# Patient Record
Sex: Male | Born: 1945
Health system: Southern US, Community
[De-identification: ages and names within clinical notes are randomized; demographics above are authoritative.]

## PROBLEM LIST (undated history)

## (undated) ENCOUNTER — Emergency Department (HOSPITAL_BASED_OUTPATIENT_CLINIC_OR_DEPARTMENT_OTHER): Admission: EM | Payer: Medicare HMO | Source: Home / Self Care

## (undated) DIAGNOSIS — C679 Malignant neoplasm of bladder, unspecified: Secondary | ICD-10-CM

## (undated) DIAGNOSIS — N4341 Spermatocele of epididymis, single: Secondary | ICD-10-CM

## (undated) DIAGNOSIS — R972 Elevated prostate specific antigen [PSA]: Secondary | ICD-10-CM

## (undated) DIAGNOSIS — E785 Hyperlipidemia, unspecified: Secondary | ICD-10-CM

## (undated) DIAGNOSIS — I2510 Atherosclerotic heart disease of native coronary artery without angina pectoris: Secondary | ICD-10-CM

## (undated) DIAGNOSIS — I861 Scrotal varices: Secondary | ICD-10-CM

## (undated) DIAGNOSIS — D649 Anemia, unspecified: Secondary | ICD-10-CM

## (undated) DIAGNOSIS — K4090 Unilateral inguinal hernia, without obstruction or gangrene, not specified as recurrent: Secondary | ICD-10-CM

## (undated) DIAGNOSIS — C44311 Basal cell carcinoma of skin of nose: Secondary | ICD-10-CM

## (undated) DIAGNOSIS — K5730 Diverticulosis of large intestine without perforation or abscess without bleeding: Secondary | ICD-10-CM

## (undated) DIAGNOSIS — G44009 Cluster headache syndrome, unspecified, not intractable: Secondary | ICD-10-CM

## (undated) DIAGNOSIS — N400 Enlarged prostate without lower urinary tract symptoms: Secondary | ICD-10-CM

## (undated) DIAGNOSIS — N529 Male erectile dysfunction, unspecified: Secondary | ICD-10-CM

## (undated) DIAGNOSIS — G43809 Other migraine, not intractable, without status migrainosus: Secondary | ICD-10-CM

## (undated) DIAGNOSIS — G4700 Insomnia, unspecified: Secondary | ICD-10-CM

## (undated) HISTORY — DX: Unilateral inguinal hernia, without obstruction or gangrene, not specified as recurrent: K40.90

## (undated) HISTORY — DX: Benign prostatic hyperplasia without lower urinary tract symptoms: N40.0

## (undated) HISTORY — DX: Hyperlipidemia, unspecified: E78.5

## (undated) HISTORY — PX: COLONOSCOPY W/ POLYPECTOMY: SHX1380

## (undated) HISTORY — DX: Elevated prostate specific antigen (PSA): R97.20

## (undated) HISTORY — PX: PROSTATE SURGERY: SHX751

## (undated) HISTORY — DX: Scrotal varices: I86.1

## (undated) HISTORY — PX: COLONOSCOPY: SHX174

## (undated) HISTORY — DX: Male erectile dysfunction, unspecified: N52.9

## (undated) HISTORY — PX: CARDIAC CATHETERIZATION: SHX172

## (undated) HISTORY — DX: Basal cell carcinoma of skin of nose: C44.311

## (undated) HISTORY — DX: Spermatocele of epididymis, single: N43.41

## (undated) HISTORY — DX: Malignant neoplasm of bladder, unspecified: C67.9

## (undated) HISTORY — DX: Insomnia, unspecified: G47.00

## (undated) HISTORY — DX: Other migraine, not intractable, without status migrainosus: G43.809

## (undated) HISTORY — PX: OTHER SURGICAL HISTORY: SHX169

## (undated) HISTORY — DX: Diverticulosis of large intestine without perforation or abscess without bleeding: K57.30

---

## 2004-07-19 ENCOUNTER — Ambulatory Visit: Payer: Self-pay | Admitting: Internal Medicine

## 2004-07-20 ENCOUNTER — Encounter: Admission: RE | Admit: 2004-07-20 | Discharge: 2004-07-20 | Payer: Self-pay | Admitting: Internal Medicine

## 2004-07-28 ENCOUNTER — Ambulatory Visit: Payer: Self-pay | Admitting: Cardiology

## 2004-09-06 ENCOUNTER — Encounter: Payer: Self-pay | Admitting: Internal Medicine

## 2004-10-08 ENCOUNTER — Ambulatory Visit: Payer: Self-pay | Admitting: Internal Medicine

## 2004-11-11 ENCOUNTER — Ambulatory Visit: Payer: Self-pay | Admitting: Internal Medicine

## 2005-01-27 ENCOUNTER — Ambulatory Visit: Payer: Self-pay | Admitting: Internal Medicine

## 2005-01-31 ENCOUNTER — Ambulatory Visit: Payer: Self-pay | Admitting: Internal Medicine

## 2006-01-12 ENCOUNTER — Ambulatory Visit: Payer: Self-pay | Admitting: Internal Medicine

## 2006-01-31 ENCOUNTER — Ambulatory Visit: Payer: Self-pay | Admitting: Gastroenterology

## 2006-02-10 ENCOUNTER — Ambulatory Visit (HOSPITAL_COMMUNITY): Admission: RE | Admit: 2006-02-10 | Discharge: 2006-02-10 | Payer: Self-pay | Admitting: Gastroenterology

## 2006-02-20 ENCOUNTER — Encounter: Payer: Self-pay | Admitting: Internal Medicine

## 2006-02-20 LAB — HM COLONOSCOPY: HM Colonoscopy: NORMAL

## 2006-03-03 ENCOUNTER — Ambulatory Visit: Payer: Self-pay | Admitting: Gastroenterology

## 2006-04-21 ENCOUNTER — Ambulatory Visit: Payer: Self-pay | Admitting: Internal Medicine

## 2006-06-08 ENCOUNTER — Ambulatory Visit: Payer: Self-pay | Admitting: Internal Medicine

## 2006-06-13 ENCOUNTER — Encounter: Admission: RE | Admit: 2006-06-13 | Discharge: 2006-06-13 | Payer: Self-pay | Admitting: Internal Medicine

## 2006-09-18 ENCOUNTER — Encounter: Payer: Self-pay | Admitting: Internal Medicine

## 2006-12-26 ENCOUNTER — Encounter: Payer: Self-pay | Admitting: Internal Medicine

## 2007-01-15 ENCOUNTER — Ambulatory Visit: Payer: Self-pay | Admitting: Internal Medicine

## 2007-01-15 DIAGNOSIS — G43809 Other migraine, not intractable, without status migrainosus: Secondary | ICD-10-CM

## 2007-01-15 DIAGNOSIS — G4700 Insomnia, unspecified: Secondary | ICD-10-CM | POA: Insufficient documentation

## 2007-01-15 DIAGNOSIS — G8929 Other chronic pain: Secondary | ICD-10-CM | POA: Insufficient documentation

## 2007-01-15 DIAGNOSIS — Z8719 Personal history of other diseases of the digestive system: Secondary | ICD-10-CM | POA: Insufficient documentation

## 2007-01-15 DIAGNOSIS — R51 Headache: Secondary | ICD-10-CM

## 2007-01-15 HISTORY — DX: Other migraine, not intractable, without status migrainosus: G43.809

## 2007-01-15 LAB — CONVERTED CEMR LAB
BUN: 18 mg/dL (ref 6–23)
Basophils Absolute: 0 K/uL (ref 0.0–0.1)
Basophils Relative: 0.7 % (ref 0.0–1.0)
CO2: 27 meq/L (ref 19–32)
Calcium: 9.1 mg/dL (ref 8.4–10.5)
Chloride: 109 meq/L (ref 96–112)
Cholesterol: 182 mg/dL (ref 0–200)
Creatinine, Ser: 1 mg/dL (ref 0.4–1.5)
Eosinophils Absolute: 0.2 K/uL (ref 0.0–0.6)
Eosinophils Relative: 5.1 % — ABNORMAL HIGH (ref 0.0–5.0)
GFR calc Af Amer: 98 mL/min
GFR calc non Af Amer: 81 mL/min
Glucose, Bld: 92 mg/dL (ref 70–99)
HCT: 42.8 % (ref 39.0–52.0)
HDL: 50.7 mg/dL (ref >39.0)
Hemoglobin: 15.2 g/dL (ref 13.0–17.0)
LDL Cholesterol: 111 mg/dL — ABNORMAL HIGH (ref 0–99)
Lymphocytes Relative: 26.3 % (ref 12.0–46.0)
MCHC: 35.4 g/dL (ref 30.0–36.0)
MCV: 96.1 fL (ref 78.0–100.0)
Monocytes Absolute: 0.4 K/uL (ref 0.2–0.7)
Monocytes Relative: 8.7 % (ref 3.0–11.0)
Neutro Abs: 2.6 K/uL (ref 1.4–7.7)
Neutrophils Relative %: 59.2 % (ref 43.0–77.0)
PSA: 3.34 ng/mL (ref 0.10–4.00)
Platelets: 258 K/uL (ref 150–400)
Potassium: 4.1 meq/L (ref 3.5–5.1)
RBC: 4.45 M/uL (ref 4.22–5.81)
RDW: 12.3 % (ref 11.5–14.6)
Sodium: 144 meq/L (ref 135–145)
TSH: 2.62 u[IU]/mL (ref 0.35–5.50)
Total CHOL/HDL Ratio: 3.6
Triglycerides: 102 mg/dL (ref 0–149)
VLDL: 20 mg/dL (ref 0–40)
WBC: 4.4 10*3/microliter — ABNORMAL LOW (ref 4.5–10.5)

## 2007-01-25 ENCOUNTER — Encounter (INDEPENDENT_AMBULATORY_CARE_PROVIDER_SITE_OTHER): Payer: Self-pay | Admitting: *Deleted

## 2007-01-25 LAB — CONVERTED CEMR LAB
BUN: 18 mg/dL (ref 6–23)
Basophils Relative: 0.7 % (ref 0.0–1.0)
CO2: 27 meq/L (ref 19–32)
Calcium: 9.1 mg/dL (ref 8.4–10.5)
Chloride: 109 meq/L (ref 96–112)
Creatinine, Ser: 1 mg/dL (ref 0.4–1.5)
GFR calc Af Amer: 98 mL/min
HDL: 50.7 mg/dL (ref >39.0)
LDL Cholesterol: 111 mg/dL — ABNORMAL HIGH (ref 0–99)
Monocytes Relative: 8.7 % (ref 3.0–11.0)
Platelets: 258 10*3/uL (ref 150–400)
RBC: 4.45 M/uL (ref 4.22–5.81)
RDW: 12.3 % (ref 11.5–14.6)
TSH: 2.62 microintl units/mL (ref 0.35–5.50)
Triglycerides: 102 mg/dL (ref 0–149)
VLDL: 20 mg/dL (ref 0–40)

## 2007-04-24 ENCOUNTER — Ambulatory Visit: Payer: Self-pay | Admitting: Internal Medicine

## 2007-08-20 ENCOUNTER — Telehealth (INDEPENDENT_AMBULATORY_CARE_PROVIDER_SITE_OTHER): Payer: Self-pay | Admitting: *Deleted

## 2008-02-04 ENCOUNTER — Ambulatory Visit: Payer: Self-pay | Admitting: Internal Medicine

## 2008-02-04 DIAGNOSIS — K5730 Diverticulosis of large intestine without perforation or abscess without bleeding: Secondary | ICD-10-CM

## 2008-02-04 HISTORY — DX: Diverticulosis of large intestine without perforation or abscess without bleeding: K57.30

## 2008-02-07 ENCOUNTER — Encounter (INDEPENDENT_AMBULATORY_CARE_PROVIDER_SITE_OTHER): Payer: Self-pay | Admitting: *Deleted

## 2008-02-07 LAB — CONVERTED CEMR LAB
AST: 22 units/L (ref 0–37)
Basophils Absolute: 0 10*3/uL (ref 0.0–0.1)
Basophils Relative: 0.4 % (ref 0.0–3.0)
Calcium: 9.2 mg/dL (ref 8.4–10.5)
Chloride: 107 meq/L (ref 96–112)
Cholesterol: 176 mg/dL (ref 0–200)
Creatinine, Ser: 1 mg/dL (ref 0.4–1.5)
Eosinophils Absolute: 0.3 10*3/uL (ref 0.0–0.7)
GFR calc Af Amer: 98 mL/min
GFR calc non Af Amer: 81 mL/min
MCHC: 34.7 g/dL (ref 30.0–36.0)
MCV: 99.3 fL (ref 78.0–100.0)
Neutro Abs: 2.4 10*3/uL (ref 1.4–7.7)
Neutrophils Relative %: 53.7 % (ref 43.0–77.0)
RBC: 4.33 M/uL (ref 4.22–5.81)
VLDL: 14 mg/dL (ref 0–40)

## 2008-02-18 ENCOUNTER — Telehealth (INDEPENDENT_AMBULATORY_CARE_PROVIDER_SITE_OTHER): Payer: Self-pay | Admitting: *Deleted

## 2008-08-20 ENCOUNTER — Telehealth: Payer: Self-pay | Admitting: Internal Medicine

## 2008-10-13 ENCOUNTER — Telehealth: Payer: Self-pay | Admitting: Internal Medicine

## 2008-10-31 ENCOUNTER — Encounter: Payer: Self-pay | Admitting: Internal Medicine

## 2008-12-17 ENCOUNTER — Ambulatory Visit: Payer: Self-pay | Admitting: Internal Medicine

## 2008-12-17 DIAGNOSIS — T887XXA Unspecified adverse effect of drug or medicament, initial encounter: Secondary | ICD-10-CM | POA: Insufficient documentation

## 2008-12-23 LAB — CONVERTED CEMR LAB
ALT: 20 units/L (ref 0–53)
Albumin: 4.1 g/dL (ref 3.5–5.2)
Total Protein: 6.9 g/dL (ref 6.0–8.3)

## 2009-02-20 ENCOUNTER — Telehealth: Payer: Self-pay | Admitting: Internal Medicine

## 2009-04-20 ENCOUNTER — Ambulatory Visit: Payer: Self-pay | Admitting: Internal Medicine

## 2009-04-28 LAB — CONVERTED CEMR LAB
ALT: 20 units/L (ref 0–53)
AST: 26 units/L (ref 0–37)
Basophils Relative: 0.7 % (ref 0.0–3.0)
Calcium: 9.1 mg/dL (ref 8.4–10.5)
Cholesterol: 173 mg/dL (ref 0–200)
Creatinine, Ser: 0.9 mg/dL (ref 0.4–1.5)
Eosinophils Absolute: 0.2 10*3/uL (ref 0.0–0.7)
Eosinophils Relative: 4.6 % (ref 0.0–5.0)
HDL: 58.3 mg/dL (ref >39.00)
LDL Cholesterol: 99 mg/dL (ref 0–99)
Lymphocytes Relative: 26.9 % (ref 12.0–46.0)
Neutrophils Relative %: 58.7 % (ref 43.0–77.0)
Platelets: 238 10*3/uL (ref 150.0–400.0)
RBC: 4.36 M/uL (ref 4.22–5.81)
Total CHOL/HDL Ratio: 3
Triglycerides: 77 mg/dL (ref 0.0–149.0)
WBC: 4.3 10*3/uL — ABNORMAL LOW (ref 4.5–10.5)

## 2009-07-25 ENCOUNTER — Emergency Department (HOSPITAL_COMMUNITY): Admission: EM | Admit: 2009-07-25 | Discharge: 2009-07-25 | Payer: Self-pay | Admitting: Family Medicine

## 2009-09-02 ENCOUNTER — Ambulatory Visit: Payer: Self-pay | Admitting: Internal Medicine

## 2009-09-07 ENCOUNTER — Telehealth (INDEPENDENT_AMBULATORY_CARE_PROVIDER_SITE_OTHER): Payer: Self-pay | Admitting: *Deleted

## 2009-10-16 ENCOUNTER — Telehealth: Payer: Self-pay | Admitting: Internal Medicine

## 2010-04-20 ENCOUNTER — Telehealth: Payer: Self-pay | Admitting: Internal Medicine

## 2010-04-25 ENCOUNTER — Encounter: Payer: Self-pay | Admitting: Internal Medicine

## 2010-04-26 ENCOUNTER — Ambulatory Visit
Admission: RE | Admit: 2010-04-26 | Discharge: 2010-04-26 | Payer: Self-pay | Source: Home / Self Care | Attending: Internal Medicine | Admitting: Internal Medicine

## 2010-04-26 ENCOUNTER — Other Ambulatory Visit: Payer: Self-pay | Admitting: Internal Medicine

## 2010-04-26 ENCOUNTER — Encounter: Payer: Self-pay | Admitting: Internal Medicine

## 2010-04-26 LAB — BASIC METABOLIC PANEL
BUN: 18 mg/dL (ref 6–23)
CO2: 25 mEq/L (ref 19–32)
GFR: 86.18 mL/min (ref >60.00)

## 2010-04-26 LAB — HEPATIC FUNCTION PANEL
Albumin: 4.2 g/dL (ref 3.5–5.2)
Bilirubin, Direct: 0.2 mg/dL (ref 0.0–0.3)
Total Bilirubin: 1.2 mg/dL (ref 0.3–1.2)
Total Protein: 6.5 g/dL (ref 6.0–8.3)

## 2010-04-26 LAB — CBC WITH DIFFERENTIAL/PLATELET
Basophils Absolute: 0 10*3/uL (ref 0.0–0.1)
Eosinophils Absolute: 0.2 10*3/uL (ref 0.0–0.7)
Eosinophils Relative: 4.8 % (ref 0.0–5.0)
HCT: 44.3 % (ref 39.0–52.0)
Lymphocytes Relative: 28.6 % (ref 12.0–46.0)
MCHC: 35 g/dL (ref 30.0–36.0)
Monocytes Absolute: 0.4 10*3/uL (ref 0.1–1.0)
Neutrophils Relative %: 57.2 % (ref 43.0–77.0)
RDW: 13.1 % (ref 11.5–14.6)
WBC: 4.1 10*3/uL — ABNORMAL LOW (ref 4.5–10.5)

## 2010-04-26 LAB — LIPID PANEL
Cholesterol: 197 mg/dL (ref 0–200)
HDL: 61.3 mg/dL (ref >39.00)
LDL Cholesterol: 115 mg/dL — ABNORMAL HIGH (ref 0–99)
Total CHOL/HDL Ratio: 3
Triglycerides: 102 mg/dL (ref 0.0–149.0)

## 2010-04-26 LAB — PSA: PSA: 3.22 ng/mL (ref 0.10–4.00)

## 2010-05-04 NOTE — Progress Notes (Signed)
Summary: labs results  Phone Note Outgoing Call   Call placed by: [facility] CMA,  September 07, 2009 10:29 AM Details for Reason: advise patient: PSA back to baseline Will recheck it at the time of his physical Summary of Call: left message to call  office............Marland KitchenFelecia Deloach CMA  September 07, 2009 10:29 AM DISCUSS WITH PATIENT .Marland KitchenMarland KitchenFelecia Deloach CMA  September 07, 2009 11:05 AM

## 2010-05-04 NOTE — Assessment & Plan Note (Signed)
Summary: CPX//PH   Vital Signs:  Patient profile:   65 year old male Height:      73 inches Weight:      203 pounds BMI:     26.88 Pulse rate:   70 / minute Pulse rhythm:   regular BP sitting:   131 / 82  (left arm) Cuff size:   large  Vitals Entered By: Shary Decamp (April 20, 2009 9:42 AM) CC: cpx   History of Present Illness: CPX feeling well    Preventive Screening-Counseling & Management  Alcohol-Tobacco     Alcohol drinks/day: 4-5/wk     Alcohol type: beer, wine     Smoking Status: quit < 6 months (17-18 years ago)  Caffeine-Diet-Exercise     Caffeine use/day: 4     Does Patient Exercise: yes     Times/week: 4  Current Medications (verified): 1)  Valtrex 1 Gm Tabs (Valacyclovir Hcl) .... Take 2  Tablet By Mouth Twice A Day For One Day For Each Episode of  Cold Sores 2)  Klonopin 0.5 Mg Tabs (Clonazepam) .... Take 1 Tablet By Mouth At Bedtime 3)  Aspir-Low 81 Mg  Tbec (Aspirin)  Allergies (verified): No Known Drug Allergies  Past History:  Past Medical History: Reviewed history from 02/04/2008 and no changes required. Diverticulosis, colon (Cscope aprox 2007) sees Dermatologist Routinely  (has sun damage)  Past Surgical History: Reviewed history from 02/04/2008 and no changes required. no  Family History: Reviewed history from 01/15/2007 and no changes required. 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 CV:  Denies chest pain or discomfort, palpitations, and swelling of feet. Resp:  Denies cough and shortness of breath. GI:  Denies bloody stools, diarrhea, nausea, and  vomiting. GU:  Denies dysuria, hematuria, urinary frequency, and urinary hesitancy. Psych:  Denies anxiety and depression; sleeps well generally , uses klonopin which helps .  Physical Exam  General:  alert, well-developed, and well-nourished.   Neck:  no thyromegaly.   Lungs:  normal respiratory effort, no intercostal retractions, no accessory muscle use, and normal breath sounds.   Heart:  normal rate, regular rhythm, no murmur, and no gallop.   Abdomen:  soft, non-tender, no distention, no hepatomegaly, and no splenomegaly.   Rectal:  No external abnormalities noted. Normal sphincter tone. No rectal masses or tenderness. Prostate:  Prostate gland firm and smooth, no enlargement, nodularity, tenderness, mass, asymmetry or induration. Extremities:  no pretibial edema bilaterally  Psych:  Cognition and judgment appear intact. Alert and cooperative with normal attention span and concentration. not anxious appearing and not depressed appearing.     Impression & Recommendations:  Problem # 1:  ROUTINE GENERAL MEDICAL EXAM@HEALTH  CARE FACL (ICD-V70.0) Td 2004 had a flu shot  had a shingles shot age 69  Cscope 2007, next 10 years  continue healthy life style  Orders: Venipuncture (16109) TLB-ALT (SGPT) (84460-ALT) TLB-AST (SGOT) (84450-SGOT) TLB-BMP (Basic Metabolic Panel-BMET) (80048-METABOL) TLB-CBC Platelet - w/Differential (85025-CBCD) TLB-Lipid Panel (80061-LIPID) TLB-PSA (Prostate Specific Antigen) (84153-PSA) TLB-TSH (Thyroid Stimulating Hormone) (84443-TSH)  Complete Medication List: 1)  Valtrex 1 Gm Tabs (Valacyclovir hcl) .... Take 2  tablet  by mouth twice a day for one day for each episode of  cold sores 2)  Klonopin 0.5 Mg Tabs (Clonazepam) .... Take 1 tablet by mouth at bedtime 3)  Aspir-low 81 Mg Tbec (Aspirin)  Patient Instructions: 1)  Please schedule a follow-up appointment in 1 year.  Prescriptions: KLONOPIN 0.5 MG TABS (CLONAZEPAM) Take 1 tablet by mouth at  bedtime  #90 x 1   Entered by:   Shary Decamp   Authorized by:   Nolon Rod. Marshelle Bilger MD   Signed by:   Shary Decamp on 04/20/2009   Method used:   Print then Give to Patient   RxID:   [id number] VALTREX 1 GM TABS (VALACYCLOVIR HCL) Take 2  tablet by mouth twice a day for one day for each episode of  cold sores  #30 x 1   Entered by:   Shary Decamp   Authorized by:   Nolon Rod. Patsy Varma MD   Signed by:   Shary Decamp on 04/20/2009   Method used:   Print then Give to Patient   RxID:   [id number]    Preventive Care Screening  Colonoscopy:    Date:  02/20/2006    Next Due:  03/2016    Results:  Diverticulosis   Prior Values:    PSA:  1.96 (02/04/2008)    Last Tetanus Booster:  Historical (04/04/2002)      Immunization History:  Influenza Immunization History:    Influenza:  historical (01/02/2009)

## 2010-05-04 NOTE — Progress Notes (Signed)
Summary: refill  Phone Note Refill Request Message from:  Fax from Pharmacy on October 16, 2009 9:57 AM  Refills Requested: Medication #1:  KLONOPIN 0.5 MG TABS Take 1 tablet by mouth at bedtime Ivar Bury - fax [phone] --- tel [phone]  Initial call taken by: Okey Regal [place],  October 16, 2009 9:57 AM  Follow-up for Phone Call        ok 90 and 1 RF Daron Stutz E. Jatziry Wechter MD  October 16, 2009 3:50 PM     Prescriptions: KLONOPIN 0.5 MG TABS (CLONAZEPAM) Take 1 tablet by mouth at bedtime  #90 x 1   Entered by:   Army Fossa CMA   Authorized by:   Nolon Rod. Makinna Andy MD   Signed by:   Army Fossa CMA on 10/16/2009   Method used:   Printed then faxed to ...       [phone]ail[facility]       [address]       [place], [place]  [address]       Ph: [phone]       Fax: [phone]   RxID:   [id number]

## 2010-05-04 NOTE — Letter (Signed)
Summary: Cancer Screening/[facility] Personalized Risk Profile  Cancer Screening/[facility] Personalized Risk Profile   Imported By: Lanelle Bal 04/29/2009 11:03:30  _____________________________________________________________________  External Attachment:    Type:   Image     Comment:   External Document

## 2010-05-06 NOTE — Progress Notes (Signed)
Summary: Rx Refill Request  Phone Note Refill Request Call back at [phone] Message from:  Fax from Pharmacy on April 20, 2010 8:49 AM  Refills Requested: Medication #1:  KLONOPIN 0.5 MG TABS Take 1 tablet by mouth at bedtime   Supply Requested: 3 months   Last Refilled: [facility]/22/[facility] [facility]# [phone]   Method Requested: Fax to Local Pharmacy Next Appointment Scheduled: Jan. 23,2012 Initial call taken by: Barnie Mort,  April 20, 2010 8:49 AM  Follow-up for Phone Call        90 and 1 RF Follow-up by: [facility] E. Paz MD,  April 20, 2010 3:17 PM    Prescriptions: KLONOPIN 0.5 MG TABS (CLONAZEPAM) Take 1 tablet by mouth at bedtime  #90 x 1   Entered by:   Army Fossa CMA   Authorized by:   Nolon Rod. Paz MD   Signed by:   Army Fossa CMA on 04/20/2010   Method used:   Printed then faxed to ...       [facility]  [phone][facility] (retail[facility]       [address]       [place], [place]  [address]       Ph: [phone] or [phone]       Fax: [phone]   RxID:   [id number]

## 2010-05-06 NOTE — Assessment & Plan Note (Signed)
Summary: cpx / lab/cbs   Vital Signs:  Patient profile:   65 year old male Height:      73 inches (185.42 cm) Weight:      198.50 pounds (90.23 kg) BMI:     26.28 Temp:     98.4 degrees F (36.89 degrees C) oral BP sitting:   110 / 70  (left arm)  Vitals Entered By: Lucious Groves CMA (April 26, 2010 8:32 AM) CC: Fasting CPX./kb Is Patient Diabetic? No Pain Assessment Patient in pain? no      Comments Needs refill of Valtrex and Klonopin.   History of Present Illness: CPX feels well   Current Medications (verified): 1)  Valtrex 1 Gm Tabs (Valacyclovir Hcl) .... Take 2  Tablet By Mouth Twice A Day For One Day For Each Episode of  Cold Sores 2)  Klonopin 0.5 Mg Tabs (Clonazepam) .... Take 1 Tablet By Mouth At Bedtime 3)  Aspir-Low 81 Mg  Tbec (Aspirin)  Allergies (verified): No Known Drug Allergies  Past History:  Past Medical History: Reviewed history from 02/04/2008 and no changes required. Diverticulosis, colon (Cscope aprox 2007) sees Dermatologist Routinely  (has sun damage)  Past Surgical History: Reviewed history from 02/04/2008 and no changes required. no  Family History: Reviewed history from 01/15/2007 and no changes required. DM: M and Uncle colon ca--no Prostate ca--no MI--no  Social History: Reviewed history from 04/20/2009 and no changes required. 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 CV:  Denies chest pain or discomfort and swelling of feet. Resp:  Denies cough and shortness of breath. GI:  Denies bloody stools, nausea, and vomiting. GU:  Denies hematuria, urinary frequency, and urinary hesitancy.  Physical Exam  General:  alert, well-developed, and well-nourished.   Neck:  no thyromegaly.  normal carotid upstroke.   Lungs:   normal respiratory effort, no intercostal retractions, no accessory muscle use, and normal breath sounds.   Heart:  normal rate, regular rhythm, no murmur, and no gallop.   Abdomen:  soft, non-tender, no distention, no hepatomegaly, and no splenomegaly.   Rectal:  No external abnormalities noted. Normal sphincter tone. No rectal masses or tenderness. Prostate:  Prostate gland firm and smooth, no enlargement, nodularity, tenderness, mass, asymmetry or induration. Extremities:  no pretibial edema bilaterally  Psych:  Oriented X3, memory intact for recent and remote, normally interactive, good eye contact, not anxious appearing, and not depressed appearing.     Impression & Recommendations:  Problem # 1:  ROUTINE GENERAL MEDICAL EXAM@HEALTH  CARE FACL (ICD-V70.0) Td 2004 had a flu shot  had a shingles shot age 7  Cscope 2007, next 10 years  continue healthy life style EKG--normal sinus rhythm, similar to previous EKGs labs.  we discussed the utility of a PSA, for now we'll continue checking yearly.    Orders: Venipuncture (04540) TLB-Lipid Panel (80061-LIPID) TLB-BMP (Basic Metabolic Panel-BMET) (80048-METABOL) TLB-CBC Platelet - w/Differential (85025-CBCD) TLB-PSA (Prostate Specific Antigen) (84153-PSA) TLB-Hepatic/Liver Function Pnl (80076-HEPATIC) Specimen Handling (98119)  Complete Medication List: 1)  Valtrex 1 Gm Tabs (Valacyclovir hcl) .... Take 2  tablet by mouth twice a day for one day for each episode of  cold sores 2)  Klonopin 0.5 Mg Tabs (Clonazepam) .... Take 1 tablet by mouth at bedtime 3)  Aspir-low 81 Mg Tbec (Aspirin)  Patient  Instructions: 1)  Please schedule a follow-up appointment in 1 year.  Prescriptions: VALTREX 1 GM TABS (VALACYCLOVIR HCL) Take 2  tablet by mouth twice a day for one day for each episode of  cold sores  #30 x 3   Entered and Authorized by:   Nolon Rod. Silvio Sausedo MD   Signed by:   Nolon Rod. Earla Charlie MD on 04/26/2010   Method used:   Print then Give to  Patient   RxID:   [id number] KLONOPIN 0.5 MG TABS (CLONAZEPAM) Take 1 tablet by mouth at bedtime  #90 x 1   Entered and Authorized by:   Elita Quick E. Rudene Poulsen MD   Signed by:   Nolon Rod. Timofey Carandang MD on 04/26/2010   Method used:   Print then Give to Patient   RxID:   [id number]    Orders Added: 1)  Venipuncture [84696] 2)  TLB-Lipid Panel [80061-LIPID] 3)  TLB-BMP (Basic Metabolic Panel-BMET) [80048-METABOL] 4)  TLB-CBC Platelet - w/Differential [85025-CBCD] 5)  TLB-PSA (Prostate Specific Antigen) [84153-PSA] 6)  TLB-Hepatic/Liver Function Pnl [80076-HEPATIC] 7)  Specimen Handling [99000] 8)  Est. Patient age 69-64 [phone]

## 2010-08-20 NOTE — Assessment & Plan Note (Signed)
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  He is  referred through the courtesy of Dr. Drue Novel for evaluation of rectal  discomfort.   Eric Vang has had some hemorrhoids problems for years.  He recently saw  Dr. Drue Novel because of some rectal discomfort and had a negative anoscopic exam,  except for some small internal hemorrhoids noted on January 12, 2006.  The  patient has had 3 flexible sigmoidoscopies in the past when he lived in  [place], last performed 4 years ago.  He currently is using some local  salve on his hemorrhoids and is having no problems or bleeding.  He does  have regular bowel movements without melena or hematochezia.  He denies  upper GI or hepatobiliary complaints.  His appetite is good and his weight  is stable, and he does follow a high-fiber diet.   PAST MEDICAL HISTORY:  Otherwise, entirely noncontributory.  He is on no  medications except for 81 mg of aspirin a day and he had recently been  started on Klonopin 0.5 mg at bedtime.  He denies allergies.   FAMILY HISTORY:  Noncontributory.   SOCIAL HISTORY:  He is married and lives with his wife and mother.  He has a  Naval architect.  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  He does  not smoke and uses ethanol socially.   REVIEW OF SYSTEMS:  Entirely noncontributory.   EXAMINATION:  He is 6 feet 1 inch tall and weighs 209 pounds.  Blood  pressure 120/84, and pulse was 60 and regular.  Could not appreciate stigmata of chronic liver disease or lymphadenopathy.  CHEST:  Clear to percussion and auscultation.  There were no murmurs, gallops, or rubs noted.  He appeared to be in a  regular rhythm.  I could not hepatosplenomegaly, abdominal  masses, or tenderness.  Peripheral extremities were unremarkable.  Mental status was normal.  RECTAL:  Deferred.   ASSESSMENT:  Eric Vang is due for screening colonoscopy because of his  age.  As mentioned above, he has had sigmoidoscopies, but never has had a  full colonoscopy exam.  It does sound like he has some mild internal  hemorrhoid problems, which are managed by increasing fiber in his diet and  local anal care.   RECOMMENDATIONS:  We have gone ahead and scheduled a colonoscopy at his  convenience, off salicylate therapy.  He is to continue his followup with  Dr. Drue Novel as previously planned.     Eric Rea. Jarold Motto, MD, Caleen Essex, FAGA    DRP/MedQ  DD: 01/31/2006  DT: 01/31/2006  Job #: [id number]   cc:   Willow Ora, MD

## 2010-10-14 ENCOUNTER — Other Ambulatory Visit: Payer: Self-pay | Admitting: Internal Medicine

## 2010-10-14 MED ORDER — CLONAZEPAM 0.5 MG PO TABS: 0.5000 mg | ORAL_TABLET | Freq: Every evening | ORAL | Status: DC | PRN

## 2010-10-14 NOTE — Telephone Encounter (Signed)
Ok 90 and 1 RF 

## 2010-10-14 NOTE — Telephone Encounter (Signed)
Last refilled 04/26/10 #90 x 1.

## 2011-04-12 ENCOUNTER — Other Ambulatory Visit: Payer: Self-pay | Admitting: Internal Medicine

## 2011-04-12 MED ORDER — CLONAZEPAM 0.5 MG PO TABS: 0.5000 mg | ORAL_TABLET | Freq: Every evening | ORAL | Status: DC | PRN

## 2011-04-12 NOTE — Telephone Encounter (Signed)
Ok #90, no refills 

## 2011-04-12 NOTE — Telephone Encounter (Signed)
Last filled 10-14-10 #90 1, last OV 04-26-10

## 2011-04-12 NOTE — Telephone Encounter (Signed)
Rx sent 

## 2013-05-25 LAB — CBC AND DIFFERENTIAL
HCT: 47 % (ref 41–53)
Hemoglobin: 15.8 g/dL (ref 13.5–17.5)
Neutrophils Absolute: 2 /uL
Platelets: 270 10*3/uL (ref 150–399)
WBC: 4.2 10^3/mL

## 2013-05-25 LAB — BASIC METABOLIC PANEL
BUN: 19 mg/dL (ref 4–21)
Creatinine: 0.8 mg/dL (ref 0.6–1.3)
GLUCOSE: 86 mg/dL
POTASSIUM: 4.3 mmol/L (ref 3.4–5.3)
Sodium: 139 mmol/L (ref 137–147)

## 2013-05-25 LAB — HEPATIC FUNCTION PANEL
ALT: 17 U/L (ref 10–40)
AST: 18 U/L (ref 14–40)
Alkaline Phosphatase: 63 U/L (ref 25–125)
Bilirubin, Total: 0.8 mg/dL

## 2013-05-25 LAB — FECAL OCCULT BLOOD, GUAIAC: Fecal Occult Blood: NEGATIVE

## 2013-05-25 LAB — TSH: TSH: 3.03 u[IU]/mL (ref 0.41–5.90)

## 2013-05-25 LAB — PSA: PSA: 3.12

## 2014-08-08 LAB — HEPATIC FUNCTION PANEL
ALK PHOS: 64 U/L (ref 25–125)
ALT: 22 U/L (ref 10–40)
AST: 20 U/L (ref 14–40)
Bilirubin, Total: 0.6 mg/dL

## 2014-08-08 LAB — LIPID PANEL
Cholesterol: 143 mg/dL (ref 0–200)
HDL: 66 mg/dL (ref 35–70)
LDL CALC: 62 mg/dL
LDL/HDL RATIO: 2
Triglycerides: 76 mg/dL (ref 40–160)

## 2014-08-08 LAB — BASIC METABOLIC PANEL
BUN: 21 mg/dL (ref 4–21)
Creatinine: 0.8 mg/dL (ref 0.6–1.3)
Glucose: 84 mg/dL
Potassium: 4.7 mmol/L (ref 3.4–5.3)
SODIUM: 144 mmol/L (ref 137–147)

## 2014-08-08 LAB — PSA: PSA: 2.96

## 2014-10-07 ENCOUNTER — Telehealth: Payer: Self-pay | Admitting: Internal Medicine

## 2014-10-07 NOTE — Telephone Encounter (Signed)
Yes, please schedule an appointment at his convenience, if he needs to be seen soon, please make it happen

## 2014-10-07 NOTE — Telephone Encounter (Signed)
FYI. Please advise.

## 2014-10-07 NOTE — Telephone Encounter (Signed)
Relation to pt: self Call back number: [phone]   Reason for call:  Pt would like to reestablish care with Dr. Larose Kells please advise

## 2014-10-07 NOTE — Telephone Encounter (Signed)
Okay to schedule new Pt appt.

## 2014-10-07 NOTE — Telephone Encounter (Signed)
LVM advising pt to schedule appointment. Should it be 30 minute or 15 minute?

## 2014-11-11 ENCOUNTER — Telehealth: Payer: Self-pay | Admitting: *Deleted

## 2014-11-11 ENCOUNTER — Encounter: Payer: Self-pay | Admitting: *Deleted

## 2014-11-11 NOTE — Telephone Encounter (Signed)
Pre-Visit Call completed with patient and chart updated.   Pre-Visit Info documented in Specialty Comments under Snap[employer]ot.    

## 2014-11-12 ENCOUNTER — Ambulatory Visit (INDEPENDENT_AMBULATORY_CARE_PROVIDER_SITE_OTHER): Payer: Medicare HMO | Admitting: Internal Medicine

## 2014-11-12 ENCOUNTER — Encounter: Payer: Self-pay | Admitting: Internal Medicine

## 2014-11-12 VITALS — BP 118/66 | HR 68 | Temp 97.5°F | Ht 73.0 in | Wt 197.1 lb

## 2014-11-12 DIAGNOSIS — R351 Nocturia: Secondary | ICD-10-CM | POA: Diagnosis not present

## 2014-11-12 DIAGNOSIS — Z23 Encounter for immunization: Secondary | ICD-10-CM | POA: Diagnosis not present

## 2014-11-12 DIAGNOSIS — E785 Hyperlipidemia, unspecified: Secondary | ICD-10-CM

## 2014-11-12 DIAGNOSIS — Z8619 Personal history of other infectious and parasitic diseases: Secondary | ICD-10-CM | POA: Diagnosis not present

## 2014-11-12 NOTE — Patient Instructions (Addendum)
Please call if you need a refill  Next visit by May 2017 for a physical exam

## 2014-11-12 NOTE — Progress Notes (Signed)
Pre visit review using our clinic review tool, if applicable. No additional management support is needed unless otherwise documented below in the visit note. 

## 2014-11-12 NOTE — Progress Notes (Signed)
Subjective:    Patient ID: Eric Vang, male    DOB: 31-May-1945, 69 y.o.   MRN: [id number]  DOS:  11/12/2014 Type of visit - description : New patient, to get reestablished.  Interval history: Mild hyperlipidemia, labs reviewed, started simvastatin in 2015, good compliance, tolerance. Insomnia: Not an issues at this point History of cluster headaches: No further symptoms Also patient suspects he may have BPH, has occasional nocturia. No other symptoms.  Labs reviewed: 05-2013: Creatinine 0.8, CBC, LFT, TSH normal. PSA 3.1 iFOB negative  Labs 08-2014: Cholesterol 143, LDL 62.  CMP normal PSA 2.9   Review of Systems  No chest pain, difficulty breathing. No nausea, vomiting, diarrhea He remains active and eats healthy.   Past Medical History  Diagnosis Date  . Hyperlipidemia     started statin 2015   . Insomnia   . CLUSTER HEADACHE 01/15/2007    x 1 time  . DIVERTICULOSIS, COLON 02/04/2008    Past Surgical History  Procedure Laterality Date  . No past surgeries      Social History   Social History  . Marital Status: Married    Spouse Name: N/A  . Number of Children: 1  . Years of Education: N/A   Occupational History  . retired Scientist, water quality, works part time now    Social History Main Topics  . Smoking status: Never Smoker   . Smokeless tobacco: Not on file  . Alcohol Use: 0.0 oz/week    0 Standard drinks or equivalent per week     Comment: Social  . Drug Use: No  . Sexual Activity: Not on file   Other Topics Concern  . Not on file   Social History Narrative   Moved from [place] daughter is a Therapist, sports   Sister in law [name]      Family History  Problem Relation Age of Onset  . Diabetes Mother 41  . Atrial fibrillation Mother   . Cancer Father 20    bone   . Colon cancer Neg Hx   . Prostate cancer Neg Hx   . CAD Neg Hx        Medication List       This list is accurate as of: 11/12/14 11:59 PM.  Always use your most  recent med list.               aspirin 81 MG tablet  Take 81 mg by mouth daily.     simvastatin 10 MG tablet  Commonly known as:  ZOCOR  Take 10 mg by mouth daily.     valACYclovir 1000 MG tablet  Commonly known as:  VALTREX  Take 1,000 mg by mouth 2 (two) times daily as needed.           Objective:   Physical Exam BP 118/66 mmHg  Pulse 68  Temp(Src) 97.5 F (36.4 C) (Oral)  Ht 6\' 1"  (1.854 m)  Wt 197 lb 2 oz (89.415 kg)  BMI 26.01 kg/m2  SpO2 99% General:   Well developed, well nourished . NAD.  HEENT:  Normocephalic . Face symmetric, atraumatic Neck: No thyromegaly Lungs:  CTA B Normal respiratory effort, no intercostal retractions, no accessory muscle use. Heart: RRR,  no murmur.  no pretibial edema bilaterally  Abdomen:  Not distended, soft, non-tender. No rebound or rigidity.  Skin: Not pale. Not jaundice Neurologic:  alert & oriented X3.  Speech normal, gait appropriate for age and unassisted Psych--  Cognition  and judgment appear intact.  Cooperative with normal attention span and concentration.  Behavior appropriate. No anxious or depressed appearing.    Assessment & Plan:

## 2014-11-13 DIAGNOSIS — N400 Enlarged prostate without lower urinary tract symptoms: Secondary | ICD-10-CM | POA: Insufficient documentation

## 2014-11-13 NOTE — Assessment & Plan Note (Signed)
Mild symptoms at night, most recent PSA is normal. We agreed to drink plenty of fluids during the daytime, avoid irritants and reassess when he comes back. Consider medication.

## 2014-11-13 NOTE — Assessment & Plan Note (Signed)
Had a LDL of 213 and LDL of 126 back in February 2015, based on that he started simvastatin, repeated blood work 08/2014 show cholesterol 133 and a LDL of 62. Good compliance and tolerance with medications, continue present care. Reassess when he comes backing 08-2015 for a physical

## 2015-02-19 DIAGNOSIS — Z23 Encounter for immunization: Secondary | ICD-10-CM | POA: Diagnosis not present

## 2015-04-05 DIAGNOSIS — C44311 Basal cell carcinoma of skin of nose: Secondary | ICD-10-CM

## 2015-04-05 DIAGNOSIS — K4090 Unilateral inguinal hernia, without obstruction or gangrene, not specified as recurrent: Secondary | ICD-10-CM

## 2015-04-05 HISTORY — PX: MOHS SURGERY: SUR867

## 2015-04-05 HISTORY — DX: Basal cell carcinoma of skin of nose: C44.311

## 2015-04-05 HISTORY — DX: Unilateral inguinal hernia, without obstruction or gangrene, not specified as recurrent: K40.90

## 2015-05-01 DIAGNOSIS — L814 Other melanin hyperpigmentation: Secondary | ICD-10-CM | POA: Diagnosis not present

## 2015-05-01 DIAGNOSIS — D2371 Other benign neoplasm of skin of right lower limb, including hip: Secondary | ICD-10-CM | POA: Diagnosis not present

## 2015-05-01 DIAGNOSIS — D485 Neoplasm of uncertain behavior of skin: Secondary | ICD-10-CM | POA: Diagnosis not present

## 2015-05-01 DIAGNOSIS — L570 Actinic keratosis: Secondary | ICD-10-CM | POA: Diagnosis not present

## 2015-05-01 DIAGNOSIS — D1801 Hemangioma of skin and subcutaneous tissue: Secondary | ICD-10-CM | POA: Diagnosis not present

## 2015-05-01 DIAGNOSIS — L853 Xerosis cutis: Secondary | ICD-10-CM | POA: Diagnosis not present

## 2015-05-01 DIAGNOSIS — D225 Melanocytic nevi of trunk: Secondary | ICD-10-CM | POA: Diagnosis not present

## 2015-05-01 DIAGNOSIS — C44311 Basal cell carcinoma of skin of nose: Secondary | ICD-10-CM | POA: Diagnosis not present

## 2015-05-01 DIAGNOSIS — L821 Other seborrheic keratosis: Secondary | ICD-10-CM | POA: Diagnosis not present

## 2015-05-05 DIAGNOSIS — R69 Illness, unspecified: Secondary | ICD-10-CM | POA: Diagnosis not present

## 2015-05-19 DIAGNOSIS — C44311 Basal cell carcinoma of skin of nose: Secondary | ICD-10-CM | POA: Diagnosis not present

## 2015-05-19 DIAGNOSIS — Z85828 Personal history of other malignant neoplasm of skin: Secondary | ICD-10-CM | POA: Diagnosis not present

## 2015-05-25 DIAGNOSIS — L570 Actinic keratosis: Secondary | ICD-10-CM | POA: Diagnosis not present

## 2015-06-02 ENCOUNTER — Encounter: Payer: Self-pay | Admitting: Internal Medicine

## 2015-06-02 ENCOUNTER — Ambulatory Visit (INDEPENDENT_AMBULATORY_CARE_PROVIDER_SITE_OTHER): Payer: Medicare HMO | Admitting: Internal Medicine

## 2015-06-02 VITALS — BP 116/66 | HR 70 | Temp 97.9°F | Ht 73.0 in | Wt 204.1 lb

## 2015-06-02 DIAGNOSIS — Z09 Encounter for follow-up examination after completed treatment for conditions other than malignant neoplasm: Secondary | ICD-10-CM

## 2015-06-02 DIAGNOSIS — M25562 Pain in left knee: Secondary | ICD-10-CM | POA: Diagnosis not present

## 2015-06-02 NOTE — Progress Notes (Signed)
Pre visit review using our clinic review tool, if applicable. No additional management support is needed unless otherwise documented below in the visit note. 

## 2015-06-02 NOTE — Progress Notes (Signed)
Subjective:    Patient ID: Eric Vang, male    DOB: [birth year], 70 y.o.   MRN: [id number]  DOS:  06/02/2015 Type of visit - description : acute  Interval history:  Sx started few months ago w/ a ill defined numbness- discomfort L leg from the mid-thigh to the foot anteriorly   and posteriorly. Pain is not necessarily associated to walking or exercise but it decreases the  days he is inactive. Denies any injury , no redness, swelling. No knee pain per se. This morning he had a brief right great toe and second toe numbness  Review of Systems When asked, admits to occasional back pain, not severe or persistent. No fever chills No nausea vomiting No dysuria or gross hematuria  Past Medical History  Diagnosis Date  . Hyperlipidemia     started statin 2015   . Insomnia   . CLUSTER HEADACHE 01/15/2007    x 1 time  . DIVERTICULOSIS, COLON 02/04/2008  . Basal cell carcinoma of nose 2017    removed    Past Surgical History  Procedure Laterality Date  . No past surgeries      Social History   Social History  . Marital Status: Married    Spouse Name: N/A  . Number of Children: 1  . Years of Education: N/A   Occupational History  . retired Scientist, water quality, works part time now    Social History Main Topics  . Smoking status: Never Smoker   . Smokeless tobacco: Not on file  . Alcohol Use: 0.0 oz/week    0 Standard drinks or equivalent per week     Comment: Social  . Drug Use: No  . Sexual Activity: Not on file   Other Topics Concern  . Not on file   Social History Narrative   Moved from [place] daughter is a Therapist, sports   Sister in Sports coach [name]         Medication List       This list is accurate as of: 06/02/15 11:59 PM.  Always use your most recent med list.               aspirin 81 MG tablet  Take 81 mg by mouth daily.     simvastatin 10 MG tablet  Commonly known as:  ZOCOR  Take 10 mg by mouth daily.     valACYclovir 1000 MG tablet    Commonly known as:  VALTREX  Take 1,000 mg by mouth 2 (two) times daily as needed. Reported on 06/02/2015           Objective:   Physical Exam BP 116/66 mmHg  Pulse 70  Temp(Src) 97.9 F (36.6 C) (Oral)  Ht 6\' 1"  (1.854 m)  Wt 204 lb 2 oz (92.59 kg)  BMI 26.94 kg/m2  SpO2 97% General:   Well developed, well nourished . NAD.  HEENT:  Normocephalic . Face symmetric, atraumatic MSK: Knees normal to inspection - palpation Calve symmetric. Vascular: Normal femoral - pedal pulses Skin: Not pale. Not jaundice Neurologic:  alert & oriented X3.  Speech normal, gait appropriate for age and unassisted. DTRs symmetric, straight leg test negative Psych--  Cognition and judgment appear intact.  Cooperative with normal attention span and concentration.  Behavior appropriate. No anxious or depressed appearing.      Assessment & Plan:   Assessment Hyperlipidemia , rx statins 2015 Insomnia BCC, nose 2017 Cold sores, prn Valtrex H/o cluster HAs 1 -- 2008  Plan: Left leg pain: Normal vascular, neuro and muscular exam, etiology of pain unclear. He does have some mild back pain, radiculopathy?. We talked possibly conservative treatment versus referral for further eval, and the end we elected conservative treatment with Tylenol, Motrin, back stretching. He will call if he is not better or if symptoms increase.

## 2015-06-02 NOTE — Patient Instructions (Addendum)
Continue with normal activities and exercise   If pain take tylenol or ibuprofen, see below:  Tylenol  500 mg OTC 2 tabs a day every 8 hours as needed for pain  IBUPROFEN (Advil or Motrin) 200 mg 2 tablets every 6 hours as needed for pain.  Always take it with food because may cause gastritis and ulcers.  If you notice nausea, stomach pain, change in the color of stools --->  Stop the medicine and let us know     [place] with information about home physical therapy for back pain: [email or web address]

## 2015-06-03 DIAGNOSIS — Z09 Encounter for follow-up examination after completed treatment for conditions other than malignant neoplasm: Secondary | ICD-10-CM | POA: Insufficient documentation

## 2015-06-03 NOTE — Assessment & Plan Note (Signed)
Left leg pain: Normal vascular, neuro and muscular exam, etiology of pain unclear. He does have some mild back pain, radiculopathy?. We talked possibly conservative treatment versus referral for further eval, and the end we elected conservative treatment with Tylenol, Motrin, back stretching. He will call if he is not better or if symptoms increase.

## 2015-06-08 ENCOUNTER — Other Ambulatory Visit: Payer: Self-pay | Admitting: *Deleted

## 2015-06-08 MED ORDER — SIMVASTATIN 10 MG PO TABS: 10.0000 mg | ORAL_TABLET | Freq: Every day | ORAL | Status: DC

## 2015-06-08 NOTE — Progress Notes (Signed)
90-day request from pharmacy for Insurance; new Rx sent/SLS 03/06

## 2015-07-07 DIAGNOSIS — H527 Unspecified disorder of refraction: Secondary | ICD-10-CM | POA: Diagnosis not present

## 2015-08-12 ENCOUNTER — Ambulatory Visit (INDEPENDENT_AMBULATORY_CARE_PROVIDER_SITE_OTHER): Payer: Medicare HMO | Admitting: Internal Medicine

## 2015-08-12 ENCOUNTER — Encounter: Payer: Self-pay | Admitting: Internal Medicine

## 2015-08-12 VITALS — BP 110/72 | HR 68 | Temp 97.6°F | Ht 73.0 in | Wt 204.1 lb

## 2015-08-12 DIAGNOSIS — E785 Hyperlipidemia, unspecified: Secondary | ICD-10-CM | POA: Diagnosis not present

## 2015-08-12 DIAGNOSIS — Z23 Encounter for immunization: Secondary | ICD-10-CM

## 2015-08-12 DIAGNOSIS — R972 Elevated prostate specific antigen [PSA]: Secondary | ICD-10-CM

## 2015-08-12 DIAGNOSIS — Z0000 Encounter for general adult medical examination without abnormal findings: Secondary | ICD-10-CM | POA: Diagnosis not present

## 2015-08-12 DIAGNOSIS — Z09 Encounter for follow-up examination after completed treatment for conditions other than malignant neoplasm: Secondary | ICD-10-CM

## 2015-08-12 LAB — CBC WITH DIFFERENTIAL/PLATELET
BASOS PCT: 0.6 % (ref 0.0–3.0)
Basophils Absolute: 0 10*3/uL (ref 0.0–0.1)
EOS ABS: 0.3 10*3/uL (ref 0.0–0.7)
EOS PCT: 5.7 % — AB (ref 0.0–5.0)
HEMATOCRIT: 44.5 % (ref 39.0–52.0)
HEMOGLOBIN: 15.5 g/dL (ref 13.0–17.0)
LYMPHS PCT: 30.4 % (ref 12.0–46.0)
Lymphs Abs: 1.7 10*3/uL (ref 0.7–4.0)
MCHC: 34.8 g/dL (ref 30.0–36.0)
MCV: 96.5 fl (ref 78.0–100.0)
Monocytes Absolute: 0.5 10*3/uL (ref 0.1–1.0)
Monocytes Relative: 9.2 % (ref 3.0–12.0)
Neutro Abs: 2.9 10*3/uL (ref 1.4–7.7)
Neutrophils Relative %: 54.1 % (ref 43.0–77.0)
Platelets: 246 10*3/uL (ref 150.0–400.0)
RBC: 4.61 Mil/uL (ref 4.22–5.81)
RDW: 13.1 % (ref 11.5–15.5)
WBC: 5.4 10*3/uL (ref 4.0–10.5)

## 2015-08-12 LAB — LIPID PANEL
CHOLESTEROL: 162 mg/dL (ref 0–200)
HDL: 65.8 mg/dL (ref >39.00)
LDL CALC: 76 mg/dL (ref 0–99)
NonHDL: 96.43
TRIGLYCERIDES: 100 mg/dL (ref 0.0–149.0)
Total CHOL/HDL Ratio: 2
VLDL: 20 mg/dL (ref 0.0–40.0)

## 2015-08-12 LAB — COMPREHENSIVE METABOLIC PANEL
ALBUMIN: 4.4 g/dL (ref 3.5–5.2)
ALT: 24 U/L (ref 0–53)
AST: 29 U/L (ref 0–37)
Alkaline Phosphatase: 55 U/L (ref 39–117)
BUN: 18 mg/dL (ref 6–23)
CALCIUM: 9.4 mg/dL (ref 8.4–10.5)
CHLORIDE: 106 meq/L (ref 96–112)
CO2: 26 mEq/L (ref 19–32)
Creatinine, Ser: 0.97 mg/dL (ref 0.40–1.50)
GFR: 81.76 mL/min (ref >60.00)
Glucose, Bld: 90 mg/dL (ref 70–99)
POTASSIUM: 4 meq/L (ref 3.5–5.1)
Sodium: 140 mEq/L (ref 135–145)
Total Bilirubin: 1 mg/dL (ref 0.2–1.2)
Total Protein: 6.4 g/dL (ref 6.0–8.3)

## 2015-08-12 LAB — TSH: TSH: 2.71 u[IU]/mL (ref 0.35–4.50)

## 2015-08-12 LAB — PSA: PSA: 5.02 ng/mL — ABNORMAL HIGH (ref 0.10–4.00)

## 2015-08-12 NOTE — Assessment & Plan Note (Signed)
Td 2016, pnm 23: 2016; prevnar today zostavax --at age 70  Cscope ~ 79- 2007, due for colonoscopy this year, will wait for GI letter Prostate cancer  screening: DRE with a slight increase prostate size, check a PSA-UA. Lifestyle discussed Labs: CMP, FLP, CBC, TSH, PSA, UA RTC 1 year

## 2015-08-12 NOTE — Progress Notes (Signed)
Pre visit review using our clinic review tool, if applicable. No additional management support is needed unless otherwise documented below in the visit note. 

## 2015-08-12 NOTE — Patient Instructions (Signed)
GO TO THE LAB : Get the blood work     GO TO THE FRONT DESK Schedule your next appointment for a complete physical exam in one year, fasting     If the back pain continue, please call for a referral  You're due for a colonoscopy by November 2017, if you don't hear from our gastroenterologist please let me know

## 2015-08-12 NOTE — Progress Notes (Signed)
Subjective:    Patient ID: Eric Vang, male    DOB: 01/27/1946, 70 y.o.   MRN: [id number]  DOS:  08/12/2015 Type of visit - description : Complete physical exam Interval history: Doing well, no major concerns except back pain. See review of systems  Review of Systems Constitutional: No fever. No chills. No unexplained wt changes. No unusual sweats  HEENT: No dental problems, no ear discharge, no facial swelling, no voice changes. No eye discharge, no eye  redness , no  intolerance to light   Respiratory: No wheezing , no  difficulty breathing. No cough , no mucus production  Cardiovascular: No CP, no leg swelling , no  Palpitations  GI: no nausea, no vomiting, no diarrhea , no  abdominal pain.  No blood in the stools. No dysphagia, no odynophagia    Endocrine: No polyphagia, no polyuria , no polydipsia  GU: No dysuria, gross hematuria, difficulty urinating. No urinary urgency, no frequency.   occasional nocturia   Musculoskeletal:  Continue with mild back pain and nighttime mostly, occasionally left leg pain, see previous office visit. Denies red flag symptoms like fever, chills, rash, bladder or bowel incontinence.   Skin: No change in the color of the skin, palor , no  Rash  Allergic, immunologic: No environmental allergies , no  food allergies  Neurological: No dizziness no  syncope. No headaches. No diplopia, no slurred, no slurred speech, no motor deficits, no facial  Numbness  Hematological: No enlarged lymph nodes, no easy bruising , no unusual bleedings  Psychiatry: No suicidal ideas, no hallucinations, no beavior problems, no confusion.  No unusual/severe anxiety, no depression   Past Medical History  Diagnosis Date  . Hyperlipidemia     started statin 2015   . Insomnia   . CLUSTER HEADACHE 01/15/2007    x 1 time  . DIVERTICULOSIS, COLON 02/04/2008  . Basal cell carcinoma of nose 2017    removed    Past Surgical History  Procedure Laterality  Date  . No past surgeries      Social History   Social History  . Marital Status: Married    Spouse Name: N/A  . Number of Children: 1  . Years of Education: N/A   Occupational History  . retired Scientist, water quality, works part time now    Social History Main Topics  . Smoking status: Never Smoker   . Smokeless tobacco: Not on file  . Alcohol Use: 0.0 oz/week    0 Standard drinks or equivalent per week     Comment: Social  . Drug Use: No  . Sexual Activity: Not on file   Other Topics Concern  . Not on file   Social History Narrative   Moved from [place] . Step daughter is a Therapist, sports   Sister in law [name]      Family History  Problem Relation Age of Onset  . Diabetes Mother 8  . Atrial fibrillation Mother   . Cancer Father 92    bone   . Colon cancer Neg Hx   . Prostate cancer Neg Hx   . CAD Neg Hx        Medication List       This list is accurate as of: 08/12/15  5:42 PM.  Always use your most recent med list.               aspirin 81 MG tablet  Take 81 mg by mouth daily.  simvastatin 10 MG tablet  Commonly known as:  ZOCOR  Take 1 tablet (10 mg total) by mouth daily.     valACYclovir 1000 MG tablet  Commonly known as:  VALTREX  Take 1,000 mg by mouth 2 (two) times daily as needed. Reported on 08/12/2015           Objective:   Physical Exam BP 110/72 mmHg  Pulse 68  Temp(Src) 97.6 F (36.4 C) (Oral)  Ht 6\' 1"  (1.854 m)  Wt 204 lb 2 oz (92.59 kg)  BMI 26.94 kg/m2  SpO2 98% General:   Well developed, well nourished . NAD.  HEENT:  Normocephalic . Face symmetric, atraumatic Neck: No thyromegaly Lungs:  CTA B Normal respiratory effort, no intercostal retractions, no accessory muscle use. Heart: RRR,  no murmur.  no pretibial edema bilaterally  Abdomen:  Not distended, soft, non-tender. No rebound or rigidity.  Rectal:  External abnormalities: none. Normal sphincter tone. No rectal masses or tenderness.  Stool brown    Prostate: Prostate gland firm and smooth, slightly enlargement but no nodularity, tenderness, mass, asymmetry or induration.  Skin: Not pale. Not jaundice Neurologic:  alert & oriented X3.  Speech normal, gait appropriate for age and unassisted. DTRs symmetric, straight leg test negative. Psych--  Cognition and judgment appear intact.  Cooperative with normal attention span and concentration.  Behavior appropriate. No anxious or depressed appearing.    Assessment & Plan:   Assessment Hyperlipidemia , rx statins 2015 Insomnia BCC, nose 2017 Cold sores, prn Valtrex H/o cluster HAs 1 -- 2008  Plan: Hyperlipidemia: Labs, call for refills BCC: Sees dermatology regularly Insomnia: Not an issue at this point  Back pain, left leg pain: See previous office visit, currently experience mild back pain mostly when he lays down. Stretching regularly. We talk about referral to orthopedic surgery for further eval, patient will think about it.   RTC one year

## 2015-08-12 NOTE — Assessment & Plan Note (Signed)
Hyperlipidemia: Labs, call for refills BCC: Sees dermatology regularly Insomnia: Not an issue at this point  Back pain, left leg pain: See previous office visit, currently experience mild back pain mostly when he lays down. Stretching regularly. We talk about referral to orthopedic surgery for further eval, patient will think about it.   RTC one year

## 2015-08-13 ENCOUNTER — Other Ambulatory Visit: Payer: Self-pay | Admitting: Internal Medicine

## 2015-08-13 ENCOUNTER — Telehealth: Payer: Self-pay

## 2015-08-13 ENCOUNTER — Other Ambulatory Visit (INDEPENDENT_AMBULATORY_CARE_PROVIDER_SITE_OTHER): Payer: Medicare HMO

## 2015-08-13 DIAGNOSIS — R972 Elevated prostate specific antigen [PSA]: Secondary | ICD-10-CM

## 2015-08-13 DIAGNOSIS — N390 Urinary tract infection, site not specified: Secondary | ICD-10-CM | POA: Diagnosis not present

## 2015-08-13 MED ORDER — CIPROFLOXACIN HCL 500 MG PO TABS: 500.0000 mg | ORAL_TABLET | Freq: Two times a day (BID) | ORAL | Status: DC

## 2015-08-13 NOTE — Telephone Encounter (Signed)
Patient would like a referral to the urologist as you offered during last phone call [phone]

## 2015-08-13 NOTE — Addendum Note (Signed)
Addended byDamita Dunnings D on: 08/13/2015 01:20 PM   Modules accepted: Orders

## 2015-08-13 NOTE — Telephone Encounter (Signed)
Spoke w/ Pt, informed him of urology referral and Dr. Larose Kells recommendations to wait on taking Abx. Pt verbalized understanding.

## 2015-08-13 NOTE — Telephone Encounter (Signed)
That is ok, please arrange. Ok to wait to discuss with them before he takes abx

## 2015-08-14 LAB — URINALYSIS, ROUTINE W REFLEX MICROSCOPIC
Bilirubin Urine: NEGATIVE
Hgb urine dipstick: NEGATIVE
Ketones, ur: NEGATIVE
Leukocytes, UA: NEGATIVE
Nitrite: NEGATIVE
RBC / HPF: NONE SEEN (ref 0–?)
SPECIFIC GRAVITY, URINE: 1.025 (ref 1.000–1.030)
Total Protein, Urine: NEGATIVE
URINE GLUCOSE: NEGATIVE
Urobilinogen, UA: 0.2 (ref 0.0–1.0)
pH: 5.5 (ref 5.0–8.0)

## 2015-08-14 NOTE — Telephone Encounter (Signed)
Yes, proceed with antibiotics

## 2015-08-14 NOTE — Telephone Encounter (Signed)
Spoke w/ Pt, informed him to proceed w/ Abx, Pt wondered if he should return in several weeks to recheck PSA. Per Dr. Larose Kells okay to wait until he see's urology in July. Pt verbalized understanding.

## 2015-08-14 NOTE — Telephone Encounter (Signed)
Can be reached: [phone]   Reason for call: Pt said that he is not scheduled for urology until 10/07/15. He is wondering if he should go ahead and pick up the abx that were sent to pharmacy and take them and f/u for labs with Dr. Larose Kells since the appt is 2 months out. Please call pt to advise.

## 2015-08-14 NOTE — Telephone Encounter (Signed)
Please advise 

## 2015-08-15 LAB — URINE CULTURE
Colony Count: NO GROWTH
Organism ID, Bacteria: NO GROWTH

## 2015-09-21 ENCOUNTER — Encounter: Payer: Self-pay | Admitting: Internal Medicine

## 2015-09-21 ENCOUNTER — Ambulatory Visit (INDEPENDENT_AMBULATORY_CARE_PROVIDER_SITE_OTHER): Payer: Medicare HMO | Admitting: Internal Medicine

## 2015-09-21 VITALS — BP 126/74 | HR 65 | Temp 97.7°F | Ht 73.0 in | Wt 202.5 lb

## 2015-09-21 DIAGNOSIS — N503 Cyst of epididymis: Secondary | ICD-10-CM | POA: Diagnosis not present

## 2015-09-21 DIAGNOSIS — K4090 Unilateral inguinal hernia, without obstruction or gangrene, not specified as recurrent: Secondary | ICD-10-CM

## 2015-09-21 NOTE — Progress Notes (Signed)
Subjective:    Patient ID: Eric Vang, male    DOB: 1946/02/18, 70 y.o.   MRN: [id number]  DOS:  09/21/2015 Type of visit - description : Acute visit Interval history: Did some yard work during the weekend, shortly after felt a lump at the right suprapubic area. Thinks he has a hernia. He did not attempt to reduce the area. Also, finished antibiotics for the elevated PSA  Review of Systems Denies fever chills No dysuria or gross hematuria No scrotal swelling or testicular pain No GU rash  Past Medical History  Diagnosis Date  . Hyperlipidemia     started statin 2015   . Insomnia   . CLUSTER HEADACHE 01/15/2007    x 1 time  . DIVERTICULOSIS, COLON 02/04/2008  . Basal cell carcinoma of nose 2017    removed    Past Surgical History  Procedure Laterality Date  . No past surgeries      Social History   Social History  . Marital Status: Married    Spouse Name: N/A  . Number of Children: 1  . Years of Education: N/A   Occupational History  . retired Scientist, water quality, works part time now    Social History Main Topics  . Smoking status: Never Smoker   . Smokeless tobacco: Not on file  . Alcohol Use: 0.0 oz/week    0 Standard drinks or equivalent per week     Comment: Social  . Drug Use: No  . Sexual Activity: Not on file   Other Topics Concern  . Not on file   Social History Narrative   Moved from [place] . Step daughter is a Therapist, sports   Sister in law [name]         Medication List       This list is accurate as of: 09/21/15 11:59 PM.  Always use your most recent med list.               aspirin 81 MG tablet  Take 81 mg by mouth daily.     simvastatin 10 MG tablet  Commonly known as:  ZOCOR  Take 1 tablet (10 mg total) by mouth daily.     valACYclovir 1000 MG tablet  Commonly known as:  VALTREX  Take 1,000 mg by mouth 2 (two) times daily as needed. Reported on 08/12/2015           Objective:   Physical Exam  Genitourinary:       BP 126/74 mmHg  Pulse 65  Temp(Src) 97.7 F (36.5 C) (Oral)  Ht 6\' 1"  (1.854 m)  Wt 202 lb 8 oz (91.853 kg)  BMI 26.72 kg/m2  SpO2 99% General:   Well developed, well nourished . NAD.  HEENT:  Normocephalic . Face symmetric, atraumatic GU: Penis normal Normal left and right testicles without pain. At the upper pole of the right testicle there is a round approximately 2.5 cm soft mass. Not tender, hard or nodular Abdomen:  Not distended, soft, non-tender. No rebound or rigidity.  + Small, reducible right hernia. See graphic Skin: Not pale. Not jaundice Neurologic:  alert & oriented X3.  Speech normal, gait appropriate for age and unassisted Psych--  Cognition and judgment appear intact.  Cooperative with normal attention span and concentration.  Behavior appropriate. No anxious or depressed appearing.    Assessment & Plan:   Assessment Hyperlipidemia , rx statins 2015 Insomnia BCC, nose 2017 Cold sores, prn Valtrex H/o cluster HAs 1 --  2008 Elevated PSA 08-2015, UCX (-), s/p abx, referred to urology  PLAN: Increased PSA: PSA was increased the last time he was here for a CPX, S/P antibiotics, to see urology soon Right inguinal hernia: likely has a hernia, sx of incarceration discussed, refer to surgery. Avoid heavy lifting until then. Right scrotal mass, likely a large epididymal cyst, for confirmation will get an ultrasound. RTC 08-2016 as scheduled

## 2015-09-21 NOTE — Patient Instructions (Signed)
Will schedule a surgery referral and a scrotal US

## 2015-09-21 NOTE — Progress Notes (Signed)
Pre visit review using our clinic review tool, if applicable. No additional management support is needed unless otherwise documented below in the visit note. 

## 2015-09-22 ENCOUNTER — Ambulatory Visit (HOSPITAL_BASED_OUTPATIENT_CLINIC_OR_DEPARTMENT_OTHER)
Admission: RE | Admit: 2015-09-22 | Discharge: 2015-09-22 | Disposition: A | Discharge status: Home / Self Care | Payer: Medicare HMO | Source: Ambulatory Visit | Attending: Internal Medicine | Admitting: Internal Medicine

## 2015-09-22 DIAGNOSIS — N433 Hydrocele, unspecified: Secondary | ICD-10-CM | POA: Diagnosis not present

## 2015-09-22 DIAGNOSIS — N503 Cyst of epididymis: Secondary | ICD-10-CM | POA: Diagnosis not present

## 2015-09-22 DIAGNOSIS — I861 Scrotal varices: Secondary | ICD-10-CM | POA: Diagnosis not present

## 2015-09-22 DIAGNOSIS — R938 Abnormal findings on diagnostic imaging of other specified body structures: Secondary | ICD-10-CM | POA: Insufficient documentation

## 2015-09-22 NOTE — Assessment & Plan Note (Signed)
Increased PSA: PSA was increased the last time he was here for a CPX, S/P antibiotics, to see urology soon Right inguinal hernia: likely has a hernia, sx of incarceration discussed, refer to surgery. Avoid heavy lifting until then. Right scrotal mass, likely a large epididymal cyst, for confirmation will get an ultrasound. RTC 08-2016 as scheduled

## 2015-10-01 DIAGNOSIS — K4090 Unilateral inguinal hernia, without obstruction or gangrene, not specified as recurrent: Secondary | ICD-10-CM | POA: Diagnosis not present

## 2015-10-01 DIAGNOSIS — N503 Cyst of epididymis: Secondary | ICD-10-CM | POA: Diagnosis not present

## 2015-10-07 DIAGNOSIS — R972 Elevated prostate specific antigen [PSA]: Secondary | ICD-10-CM | POA: Diagnosis not present

## 2015-10-07 DIAGNOSIS — N4341 Spermatocele of epididymis, single: Secondary | ICD-10-CM | POA: Diagnosis not present

## 2015-10-07 DIAGNOSIS — I861 Scrotal varices: Secondary | ICD-10-CM | POA: Diagnosis not present

## 2015-10-07 DIAGNOSIS — K4090 Unilateral inguinal hernia, without obstruction or gangrene, not specified as recurrent: Secondary | ICD-10-CM | POA: Diagnosis not present

## 2015-10-12 ENCOUNTER — Telehealth: Payer: Self-pay

## 2015-10-12 MED ORDER — VALACYCLOVIR HCL 1 G PO TABS: 1000.0000 mg | ORAL_TABLET | Freq: Two times a day (BID) | ORAL | Status: DC | PRN

## 2015-10-12 NOTE — Telephone Encounter (Signed)
Ok refill: Valtrex 1 g po bid x 5 days prn cold sores x 5 days per episode #30, 3 RF

## 2015-10-12 NOTE — Telephone Encounter (Signed)
Received fax from pharmacy, Pt requesting refill on Valacyclovir HCL 1 gram tablet. SIG: 1 tablet twice daily PRN. Medication history reviewed, never filled by PCP. Please advise.

## 2015-10-12 NOTE — Telephone Encounter (Signed)
Rx sent 

## 2015-10-14 ENCOUNTER — Telehealth: Payer: Self-pay | Admitting: Internal Medicine

## 2015-10-14 NOTE — Telephone Encounter (Signed)
Medication Detail       Disp Refills Start End      valACYclovir (VALTREX) 1000 MG tablet 30 tablet 3 10/12/2015      Sig - Route: Take 1 tablet (1,000 mg total) by mouth 2 (two) times daily as needed (cold sores). For 5 days per episode - Oral     E-Prescribing Status: Receipt confirmed by pharmacy (10/12/2015 12:42 PM EDT)       Refills sent to [facility] on 10/12/2015.

## 2015-10-14 NOTE — Telephone Encounter (Signed)
Patient informed of below.

## 2015-10-14 NOTE — Telephone Encounter (Signed)
°  Relationship to patient: Self  Can be reached: [phone] Pharmacy:  [place], [place] - [address] [phone] (Phone) [phone] (Fax)       Reason for call:Request refill onvalACYclovir (VALTREX) 1000 MG tablet [id number]

## 2015-10-22 ENCOUNTER — Ambulatory Visit: Payer: Self-pay | Admitting: General Surgery

## 2015-10-22 NOTE — H&P (Signed)
Eric Vang Location: [facility] Patient #: [id number] DOB: 04/18/1945 Married / Language: English / Race: White Male   History of Present Illness  Patient words: Inguinal hernia.  The patient is a 70 year old male who presents with an inguinal hernia. He is referred by Dr Larose Kells for evaluation of a right inguinal hernia. The patient had done some yard work for several hours and the following day when getting out of the shower he noticed a bulge in his right groin. It was nontender. He was able to be reduced. He ended up seeing his primary care physician who confirmed a writing hernia. He also found a right scrotal mass. He had an ultrasound which showed a right epididymal cyst or spermatocele. He is also found to have a left varicocele as well as probable bilateral hydroceles. He has an appointment with Dr. Era Bumpers next week. He denies any fever, chills, nausea, vomiting, diarrhea or constipation. He denies any chest pain shows a breath. He does not smoke. He denies any groin burning, stabbing, shooting pain. It most it feels like a tugging sensation  He does endorse some nocturia and sensation of incomplete emptying   Problem List/Past Medical ) EPIDIDYMAL CYST (N50.3) RIGHT INGUINAL HERNIA (K40.90)  Past Surgical History  No pertinent past surgical history  Allergies No Known Drug Allergies  Medication History Aspirin (81MG  Tablet, Oral daily) Active. ValACYclovir HCl (1GM Tablet, Oral as needed) Active. Simvastatin (10MG  Tablet, Oral daily) Active. Medications Reconciled Flomax (0.4MG  Capsule, 1 (one) Capsule Oral at bedtime, Taken starting 10/01/2015) Active. (start taking 2 weeks before surgery)  Social History  Alcohol use Occasional alcohol use. Caffeine use Coffee. No drug use Tobacco use Former smoker.    Review of Systems  General Not Present- Appetite Loss, Chills, Fatigue, Fever, Night Sweats, Weight Gain and Weight  Loss. Skin Not Present- Change in Wart/Mole, Dryness, Hives, Jaundice, New Lesions, Non-Healing Wounds, Rash and Ulcer. HEENT Not Present- Earache, Hearing Loss, Hoarseness, Nose Bleed, Oral Ulcers, Ringing in the Ears, Seasonal Allergies, Sinus Pain, Sore Throat, Visual Disturbances, Wears glasses/contact lenses and Yellow Eyes. Respiratory Not Present- Bloody sputum, Chronic Cough, Difficulty Breathing, Snoring and Wheezing. Breast Not Present- Breast Mass, Breast Pain, Nipple Discharge and Skin Changes. Cardiovascular Not Present- Chest Pain, Difficulty Breathing Lying Down, Leg Cramps, Palpitations, Rapid Heart Rate, Shortness of Breath and Swelling of Extremities. Gastrointestinal Not Present- Abdominal Pain, Bloating, Bloody Stool, Change in Bowel Habits, Chronic diarrhea, Constipation, Difficulty Swallowing, Excessive gas, Gets full quickly at meals, Hemorrhoids, Indigestion, Nausea, Rectal Pain and Vomiting. Male Genitourinary Not Present- Blood in Urine, Change in Urinary Stream, Frequency, Impotence, Nocturia, Painful Urination, Urgency and Urine Leakage. Musculoskeletal Not Present- Back Pain, Joint Pain, Joint Stiffness, Muscle Pain, Muscle Weakness and Swelling of Extremities. Neurological Not Present- Decreased Memory, Fainting, Headaches, Numbness, Seizures, Tingling, Tremor, Trouble walking and Weakness. Psychiatric Not Present- Anxiety, Bipolar, Change in Sleep Pattern, Depression, Fearful and Frequent crying. Endocrine Not Present- Cold Intolerance, Excessive Hunger, Hair Changes, Heat Intolerance, Hot flashes and New Diabetes. Hematology Not Present- Blood Thinners, Easy Bruising, Excessive bleeding, Gland problems, HIV and Persistent Infections.  Vitals  Weight: 198.6 lb Height: 73in Height was reported by patient. Body Surface Area: 2.15 m Body Mass Index: 26.2 kg/m  Temp.: 97.58F  Pulse: 93 (Regular)  BP: 112/78 (Sitting, Left Arm, Standard)    Physical Exam  ( General Mental Status-Alert. General Appearance-Consistent with stated age. Hydration-Well hydrated. Voice-Normal.  Head and Neck Head-normocephalic, atraumatic with no lesions  or palpable masses. Trachea-midline. Thyroid Gland Characteristics - normal size and consistency.  Eye Eyeball - Bilateral-Extraocular movements intact. Sclera/Conjunctiva - Bilateral-No scleral icterus.  Chest and Lung Exam Chest and lung exam reveals -quiet, even and easy respiratory effort with no use of accessory muscles and on auscultation, normal breath sounds, no adventitious sounds and normal vocal resonance. Inspection Chest Wall - Normal. Back - normal.  Breast - Did not examine.  Cardiovascular Cardiovascular examination reveals -normal heart sounds, regular rate and rhythm with no murmurs and normal pedal pulses bilaterally.  Abdomen Inspection Inspection of the abdomen reveals - No Hernias. Skin - Scar - no surgical scars. Palpation/Percussion Palpation and Percussion of the abdomen reveal - Soft, Non Tender, No Rebound tenderness, No Rigidity (guarding) and No hepatosplenomegaly. Auscultation Auscultation of the abdomen reveals - Bowel sounds normal.  Male Genitourinary Note: Patient examined supine and standing with and without Valsalva maneuvers. Both testicles are down. He has an oval soft mass on top of his right testicle. He has what appears to be varicoceles on the left. No impulse or bulge or enlarged inguinal ring on the left. He has an obvious bulge in the right groin in the suprapubic area. The hernia is reducible.   Peripheral Vascular Upper Extremity Palpation - Pulses bilaterally normal.  Neurologic Neurologic evaluation reveals -alert and oriented x 3 with no impairment of recent or remote memory. Mental Status-Normal.  Neuropsychiatric The patient's mood and affect are described as -normal. Judgment and Insight-insight is appropriate  concerning matters relevant to self.  Musculoskeletal Normal Exam - Left-Upper Extremity Strength Normal and Lower Extremity Strength Normal. Normal Exam - Right-Upper Extremity Strength Normal and Lower Extremity Strength Normal.  Lymphatic Head & Neck  General Head & Neck Lymphatics: Bilateral - Description - Normal. Axillary - Did not examine. Femoral & Inguinal - Did not examine.    Assessment & Plan  RIGHT INGUINAL HERNIA (K40.90) Impression: We discussed the etiology of inguinal hernias. We discussed the signs & symptoms of incarceration & strangulation. We discussed non-operative and operative management.  The patient has elected to proceed with laparoscopic repair of right inguinal hernia with mesh  I described the procedure in detail. The patient was given educational material. We discussed the risks and benefits including but not limited to bleeding, infection, chronic inguinal pain, nerve entrapment, hernia recurrence, mesh complications, hematoma formation, urinary retention, injury to the testicle, numbness in the groin, blood clots, injury to the surrounding structures, and anesthesia risk. We also discussed the typical post operative recovery course, including no heavy lifting for 4-6 weeks. I explained that the likelihood of improvement of their symptoms is good.  Because of his BPH symptoms we will place him on perioperative Flomax.  Because of his scrotal findings I want to wait until he sees Dr. Gaynelle Arabian to make sure that he does not need surgical intervention for his scrotal findings. If he is found to need surgery for his varicocele or hydrocele or cyst then we can probably do a combo procedure. The patient was instructed to contact our office after his appointment with the urologist next week Current Plans Pt Education - Pamphlet Given - Laparoscopic Hernia Repair: discussed with patient and provided information. Started Flomax 0.4MG , 1 (one) Capsule at  bedtime, #30, 10/01/2015, No Refill. Local Order: start taking 2 weeks before surgery EPIDIDYMAL CYST (N50.3)  Leighton Ruff. Redmond Pulling, MD, FACS General, Bariatric, & Minimally Invasive Surgery [facility], [place]

## 2015-11-10 DIAGNOSIS — R69 Illness, unspecified: Secondary | ICD-10-CM | POA: Diagnosis not present

## 2015-11-25 ENCOUNTER — Encounter (HOSPITAL_COMMUNITY): Payer: Self-pay | Admitting: Emergency Medicine

## 2015-11-25 ENCOUNTER — Encounter (HOSPITAL_COMMUNITY): Payer: Self-pay

## 2015-11-25 ENCOUNTER — Encounter (HOSPITAL_COMMUNITY)
Admission: RE | Admit: 2015-11-25 | Discharge: 2015-11-25 | Disposition: A | Discharge status: Home / Self Care | Payer: Medicare HMO | Source: Ambulatory Visit | Attending: General Surgery | Admitting: General Surgery

## 2015-11-25 DIAGNOSIS — Z01812 Encounter for preprocedural laboratory examination: Secondary | ICD-10-CM | POA: Diagnosis not present

## 2015-11-25 DIAGNOSIS — Z79899 Other long term (current) drug therapy: Secondary | ICD-10-CM | POA: Diagnosis not present

## 2015-11-25 DIAGNOSIS — Z7982 Long term (current) use of aspirin: Secondary | ICD-10-CM | POA: Diagnosis not present

## 2015-11-25 DIAGNOSIS — Z01818 Encounter for other preprocedural examination: Secondary | ICD-10-CM | POA: Insufficient documentation

## 2015-11-25 DIAGNOSIS — E785 Hyperlipidemia, unspecified: Secondary | ICD-10-CM | POA: Insufficient documentation

## 2015-11-25 DIAGNOSIS — K4090 Unilateral inguinal hernia, without obstruction or gangrene, not specified as recurrent: Secondary | ICD-10-CM | POA: Insufficient documentation

## 2015-11-25 DIAGNOSIS — Z87891 Personal history of nicotine dependence: Secondary | ICD-10-CM | POA: Insufficient documentation

## 2015-11-25 HISTORY — DX: Unilateral inguinal hernia, without obstruction or gangrene, not specified as recurrent: K40.90

## 2015-11-25 LAB — BASIC METABOLIC PANEL
ANION GAP: 7 (ref 5–15)
BUN: 18 mg/dL (ref 6–20)
CHLORIDE: 108 mmol/L (ref 101–111)
CO2: 23 mmol/L (ref 22–32)
CREATININE: 0.87 mg/dL (ref 0.61–1.24)
Calcium: 9.3 mg/dL (ref 8.9–10.3)
GFR calc non Af Amer: >60 mL/min (ref >60)
GLUCOSE: 99 mg/dL (ref 65–99)
POTASSIUM: 3.8 mmol/L (ref 3.5–5.1)
Sodium: 138 mmol/L (ref 135–145)

## 2015-11-25 LAB — CBC
HEMATOCRIT: 45.5 % (ref 39.0–52.0)
Hemoglobin: 15.3 g/dL (ref 13.0–17.0)
MCH: 32.8 pg (ref 26.0–34.0)
MCHC: 33.6 g/dL (ref 30.0–36.0)
MCV: 97.4 fL (ref 78.0–100.0)
PLATELETS: 225 10*3/uL (ref 150–400)
RBC: 4.67 MIL/uL (ref 4.22–5.81)
RDW: 12.8 % (ref 11.5–15.5)
WBC: 5.6 10*3/uL (ref 4.0–10.5)

## 2015-11-25 NOTE — Pre-Procedure Instructions (Signed)
Eric Vang  11/25/2015      Eric Vang, [place] - [address] [place] [address] Phone: [phone] Fax: [phone]    Your procedure is scheduled on Thursday, December 03, 2015  Report to [facility] Admitting at 1:00 P.M.  Call this number if you have problems the morning of surgery:  [phone]   Remember:  Do not eat food or drink liquids after midnight Wednesday, December 02, 2015  Take these medicines the morning of surgery with A SIP OF WATER : Tamsulosin ( Flomax) Stop taking Aspirin, vitamins, fish oil and herbal medications. Do not take any NSAIDs ie: Ibuprofen, Advil, Naproxen, BC and Goody Powder or any medication containing Aspirin; stop Thursday, November 26, 2015.  Do not wear jewelry, make-up or nail polish.  Do not wear lotions, powders, or perfumes, or deoderant.  Do not shave 48 hours prior to surgery.  Men may shave face and neck.  Do not bring valuables to the hospital.  [facility] is not responsible for any belongings or valuables.  Contacts, dentures or bridgework may not be worn into surgery.  Leave your suitcase in the car.  After surgery it may be brought to your room.  For patients admitted to the hospital, discharge time will be determined by your treatment team.  Patients discharged the day of surgery will not be allowed to drive home.   Name and phone number of your driver:  Special instructions: [place] - Preparing for Surgery  Before surgery, you can play an important role.  Because skin is not sterile, your skin needs to be as free of germs as possible.  You can reduce the number of germs on you skin by washing with CHG (chlorahexidine gluconate) soap before surgery.  CHG is an antiseptic cleaner which kills germs and bonds with the skin to continue killing germs even after washing.  Please DO NOT use if you have an allergy to CHG or antibacterial soaps.  If  your skin becomes reddened/irritated stop using the CHG and inform your nurse when you arrive at Short Stay.  Do not shave (including legs and underarms) for at least 48 hours prior to the first CHG shower.  You may shave your face.  Please follow these instructions carefully:   1.  Shower with CHG Soap the night before surgery and the morning of Surgery.  2.  If you choose to wash your hair, wash your hair first as usual with your normal shampoo.  3.  After you shampoo, rinse your hair and body thoroughly to remove the Shampoo.  4.  Use CHG as you would any other liquid soap.  You can apply chg directly  to the skin and wash gently with scrungie or a clean washcloth.  5.  Apply the CHG Soap to your body ONLY FROM THE NECK DOWN.  Do not use on open wounds or open sores.  Avoid contact with your eyes, ears, mouth and genitals (private parts).  Wash genitals (private parts) with your normal soap.  6.  Wash thoroughly, paying special attention to the area where your surgery will be performed.  7.  Thoroughly rinse your body with warm water from the neck down.  8.  DO NOT shower/wash with your normal soap after using and rinsing off the CHG Soap.  9.  Pat yourself dry with a clean towel.  10.  Wear clean pajamas.            11.  Place clean sheets on your bed the night of your first shower and do not sleep with pets.  Day of Surgery  Do not apply any lotions/deodorants the morning of surgery.  Please wear clean clothes to the hospital/surgery center.  Please read over the following fact sheets that you were given. Pain Booklet, Coughing and Deep Breathing and Surgical Site Infection Prevention

## 2015-11-25 NOTE — Progress Notes (Signed)
Pt denies SOB, chest pain, and being under the care of a cardiologist. Willeen Cass, NP, Anesthesia, made aware that pt denies any cardiac history but stated that " a cardiac cath was done > 20 years ago and a stress and echo was possibly done > 10 years ago in [place]."  Pt stated that a second opinion was obtained when the primary cardiologist stated that there was some ischemia and prescribed a medication as a result . According to the pt, the cardiologist that did the follow up evaluation, advised that the cardiac medication be discontinued and that no further cardiac work up was needed. After reviewing EKG (2012)  note in Epic ( no tracing available) and the EKG in [facility] ( 2007), NP advised to repeat EKG. Pt denies having an  EKG and chest x ray within the last year. Pt denies having labs within the last 2 weeks. Pt chart forwarded to anesthesia to review EKG.

## 2015-11-27 NOTE — Progress Notes (Signed)
Anesthesia Chart Review:  Pt is a 70 year old male scheduled for laparoscopic repair of R inguinal hernia with mesh on 12/03/2015 with [name], MD.   PMH includes:  Hyperlipidemia. Former smoker. BMI 26  Medications include: ASA, simvastatin  Preoperative labs reviewed.    EKG 11/25/15: NSR. T wave abnormality, consider inferior ischemia  Pt reportedly had cardiac cath >20 years ago and stress test and echo >10 years ago, all in [place] and all normal per pt. Does not see cardiology anymore.  Reviewed case with Dr. Conrad [place]. Pt will need to see cardiology for pre-op eval due to abnormal EKG.   Willeen Cass, FNP-BC [facility] Stay Surgical [facility]/Anesthesiology Phone: [phone] 11/27/2015 5:14 PM

## 2015-11-30 ENCOUNTER — Telehealth: Payer: Self-pay | Admitting: Internal Medicine

## 2015-11-30 NOTE — Telephone Encounter (Signed)
Note faxed to [facility] at [facility] at [phone].

## 2015-11-30 NOTE — Telephone Encounter (Signed)
Received a phone call from [place] at [place] [facility], preop EKG abnormal, I compared the EKG: The previous one from 2007 and I don't see any major difference. Unless  the patient has cardiovascular symptoms I don't see a contraindication for surgery. If surgery/anesthesia prefer further evaluation I could arrange a cardiac referral. Will fax this note to surgery.

## 2015-12-03 ENCOUNTER — Ambulatory Visit (HOSPITAL_COMMUNITY): Admission: RE | Admit: 2015-12-03 | Payer: Medicare HMO | Source: Ambulatory Visit | Admitting: General Surgery

## 2015-12-03 ENCOUNTER — Encounter (HOSPITAL_COMMUNITY): Admission: RE | Payer: Self-pay | Source: Ambulatory Visit

## 2015-12-03 ENCOUNTER — Ambulatory Visit (HOSPITAL_COMMUNITY): Payer: Medicare HMO | Admitting: Anesthesiology

## 2015-12-03 ENCOUNTER — Encounter (HOSPITAL_COMMUNITY)
Admission: RE | Disposition: A | Discharge status: Home / Self Care | Payer: Self-pay | Source: Ambulatory Visit | Attending: General Surgery

## 2015-12-03 ENCOUNTER — Encounter (HOSPITAL_COMMUNITY): Payer: Self-pay | Admitting: Anesthesiology

## 2015-12-03 ENCOUNTER — Ambulatory Visit (HOSPITAL_COMMUNITY)
Admission: RE | Admit: 2015-12-03 | Discharge: 2015-12-03 | Disposition: A | Discharge status: Home / Self Care | Payer: Medicare HMO | Source: Ambulatory Visit | Attending: General Surgery | Admitting: General Surgery

## 2015-12-03 DIAGNOSIS — Z79899 Other long term (current) drug therapy: Secondary | ICD-10-CM | POA: Insufficient documentation

## 2015-12-03 DIAGNOSIS — E785 Hyperlipidemia, unspecified: Secondary | ICD-10-CM | POA: Diagnosis not present

## 2015-12-03 DIAGNOSIS — Z7982 Long term (current) use of aspirin: Secondary | ICD-10-CM | POA: Diagnosis not present

## 2015-12-03 DIAGNOSIS — K4090 Unilateral inguinal hernia, without obstruction or gangrene, not specified as recurrent: Secondary | ICD-10-CM | POA: Diagnosis not present

## 2015-12-03 DIAGNOSIS — Z87891 Personal history of nicotine dependence: Secondary | ICD-10-CM | POA: Diagnosis not present

## 2015-12-03 HISTORY — PX: INGUINAL HERNIA REPAIR: SHX194

## 2015-12-03 HISTORY — PX: INSERTION OF MESH: SHX5868

## 2015-12-03 SURGERY — REPAIR, HERNIA, INGUINAL, LAPAROSCOPIC
Anesthesia: General | Laterality: Right

## 2015-12-03 SURGERY — REPAIR, HERNIA, INGUINAL, LAPAROSCOPIC
Anesthesia: General | Site: Abdomen | Laterality: Right

## 2015-12-03 MED ORDER — CHLORHEXIDINE GLUCONATE 4 % EX LIQD: 60.0000 mL | Freq: Once | CUTANEOUS | Status: DC

## 2015-12-03 MED ORDER — HYDROMORPHONE HCL 1 MG/ML IJ SOLN: 0.2500 mg | INTRAMUSCULAR | Status: DC | PRN

## 2015-12-03 MED ORDER — LIDOCAINE HCL (CARDIAC) 20 MG/ML IV SOLN
INTRAVENOUS | Status: DC | PRN
  Administered 2015-12-03: 100 mg via INTRAVENOUS

## 2015-12-03 MED ORDER — FENTANYL CITRATE (PF) 100 MCG/2ML IJ SOLN
INTRAMUSCULAR | Status: DC | PRN
  Administered 2015-12-03 (×2): 50 ug via INTRAVENOUS

## 2015-12-03 MED ORDER — EPHEDRINE SULFATE 50 MG/ML IJ SOLN
INTRAMUSCULAR | Status: DC | PRN
  Administered 2015-12-03 (×2): 5 mg via INTRAVENOUS

## 2015-12-03 MED ORDER — MIDAZOLAM HCL 5 MG/5ML IJ SOLN
INTRAMUSCULAR | Status: DC | PRN
  Administered 2015-12-03: 2 mg via INTRAVENOUS

## 2015-12-03 MED ORDER — DEXAMETHASONE SODIUM PHOSPHATE 4 MG/ML IJ SOLN
INTRAMUSCULAR | Status: DC | PRN
  Administered 2015-12-03: 4 mg via INTRAVENOUS

## 2015-12-03 MED ORDER — PROPOFOL 10 MG/ML IV BOLUS
INTRAVENOUS | Status: DC | PRN
  Administered 2015-12-03: 180 mg via INTRAVENOUS

## 2015-12-03 MED ORDER — GLYCOPYRROLATE 0.2 MG/ML IJ SOLN
INTRAMUSCULAR | Status: DC | PRN
  Administered 2015-12-03: 0.6 mg via INTRAVENOUS

## 2015-12-03 MED ORDER — CEFAZOLIN SODIUM-DEXTROSE 2-4 GM/100ML-% IV SOLN
2.0000 g | INTRAVENOUS | Status: AC
  Administered 2015-12-03: 2 g via INTRAVENOUS
  Filled 2015-12-03: qty 100

## 2015-12-03 MED ORDER — ACETAMINOPHEN 500 MG PO TABS
1000.0000 mg | ORAL_TABLET | ORAL | Status: AC
  Administered 2015-12-03: 1000 mg via ORAL
  Filled 2015-12-03: qty 2

## 2015-12-03 MED ORDER — GABAPENTIN 300 MG PO CAPS
300.0000 mg | ORAL_CAPSULE | ORAL | Status: AC
  Administered 2015-12-03: 300 mg via ORAL
  Filled 2015-12-03: qty 1

## 2015-12-03 MED ORDER — ROCURONIUM BROMIDE 100 MG/10ML IV SOLN
INTRAVENOUS | Status: DC | PRN
  Administered 2015-12-03: 40 mg via INTRAVENOUS
  Administered 2015-12-03: 10 mg via INTRAVENOUS

## 2015-12-03 MED ORDER — BUPIVACAINE-EPINEPHRINE 0.25% -1:200000 IJ SOLN
INTRAMUSCULAR | Status: DC | PRN
  Administered 2015-12-03: 30 mL

## 2015-12-03 MED ORDER — KETOROLAC TROMETHAMINE 15 MG/ML IJ SOLN
INTRAMUSCULAR | Status: DC | PRN
  Administered 2015-12-03: 15 mg via INTRAVENOUS

## 2015-12-03 MED ORDER — LACTATED RINGERS IV SOLN
INTRAVENOUS | Status: DC
  Administered 2015-12-03: 13:00:00 via INTRAVENOUS

## 2015-12-03 MED ORDER — OXYCODONE HCL 5 MG PO TABS: 5.0000 mg | ORAL_TABLET | Freq: Four times a day (QID) | ORAL | 0 refills | Status: DC | PRN

## 2015-12-03 MED ORDER — BUPIVACAINE-EPINEPHRINE (PF) 0.25% -1:200000 IJ SOLN
INTRAMUSCULAR | Status: AC
  Filled 2015-12-03: qty 30

## 2015-12-03 MED ORDER — ONDANSETRON HCL 4 MG/2ML IJ SOLN
INTRAMUSCULAR | Status: DC | PRN
  Administered 2015-12-03: 4 mg via INTRAVENOUS

## 2015-12-03 MED ORDER — 0.9 % SODIUM CHLORIDE (POUR BTL) OPTIME
TOPICAL | Status: DC | PRN
  Administered 2015-12-03: 1000 mL

## 2015-12-03 MED ORDER — PHENYLEPHRINE HCL 10 MG/ML IJ SOLN
INTRAMUSCULAR | Status: DC | PRN
  Administered 2015-12-03 (×2): 40 ug via INTRAVENOUS

## 2015-12-03 MED ORDER — DEXAMETHASONE SODIUM PHOSPHATE 10 MG/ML IJ SOLN
INTRAMUSCULAR | Status: AC
  Filled 2015-12-03: qty 1

## 2015-12-03 MED ORDER — NEOSTIGMINE METHYLSULFATE 10 MG/10ML IV SOLN
INTRAVENOUS | Status: DC | PRN
  Administered 2015-12-03: 4 mg via INTRAVENOUS

## 2015-12-03 SURGICAL SUPPLY — 50 items
APPLIER CLIP 5 13 M/L LIGAMAX5 (MISCELLANEOUS)
APPLIER CLIP ROT 10 11.4 M/L (STAPLE)
BANDAGE ADH SHEER 1  50/CT (GAUZE/BANDAGES/DRESSINGS) ×2 IMPLANT
BENZOIN TINCTURE PRP APPL 2/3 (GAUZE/BANDAGES/DRESSINGS) ×2 IMPLANT
BLADE SURG ROTATE 9660 (MISCELLANEOUS) IMPLANT
CANISTER SUCTION 2500CC (MISCELLANEOUS) IMPLANT
CHLORAPREP W/TINT 26ML (MISCELLANEOUS) ×2 IMPLANT
CLIP APPLIE 5 13 M/L LIGAMAX5 (MISCELLANEOUS) IMPLANT
CLIP APPLIE ROT 10 11.4 M/L (STAPLE) IMPLANT
COVER SURGICAL LIGHT HANDLE (MISCELLANEOUS) ×2 IMPLANT
DEVICE SECURE STRAP 25 ABSORB (INSTRUMENTS) ×2 IMPLANT
DRSG TEGADERM 4X4.75 (GAUZE/BANDAGES/DRESSINGS) ×2 IMPLANT
ELECT CAUTERY BLADE 6.4 (BLADE) ×2 IMPLANT
ELECT REM PT RETURN 9FT ADLT (ELECTROSURGICAL) ×2
ELECTRODE REM PT RTRN 9FT ADLT (ELECTROSURGICAL) ×1 IMPLANT
ENDOLOOP SUT PDS II  0 18 (SUTURE) ×1
ENDOLOOP SUT PDS II 0 18 (SUTURE) ×1 IMPLANT
GAUZE SPONGE 2X2 8PLY STRL LF (GAUZE/BANDAGES/DRESSINGS) ×1 IMPLANT
GLOVE BIOGEL M STRL SZ7.5 (GLOVE) ×4 IMPLANT
GLOVE BIOGEL PI IND STRL 6.5 (GLOVE) ×3 IMPLANT
GLOVE BIOGEL PI IND STRL 7.0 (GLOVE) ×1 IMPLANT
GLOVE BIOGEL PI IND STRL 8 (GLOVE) ×1 IMPLANT
GLOVE BIOGEL PI INDICATOR 6.5 (GLOVE) ×3
GLOVE BIOGEL PI INDICATOR 7.0 (GLOVE) ×1
GLOVE BIOGEL PI INDICATOR 8 (GLOVE) ×1
GLOVE SURG SS PI 7.0 STRL IVOR (GLOVE) ×2 IMPLANT
GOWN STRL REUS W/ TWL LRG LVL3 (GOWN DISPOSABLE) ×2 IMPLANT
GOWN STRL REUS W/TWL 2XL LVL3 (GOWN DISPOSABLE) ×2 IMPLANT
GOWN STRL REUS W/TWL LRG LVL3 (GOWN DISPOSABLE) ×2
KIT BASIN OR (CUSTOM PROCEDURE TRAY) ×2 IMPLANT
KIT ROOM TURNOVER OR (KITS) ×2 IMPLANT
LIQUID BAND (GAUZE/BANDAGES/DRESSINGS) IMPLANT
MESH 3DMAX 4X6 RT LRG (Mesh General) ×2 IMPLANT
NEEDLE HYPO 25GX1X1/2 BEV (NEEDLE) ×2 IMPLANT
NS IRRIG 1000ML POUR BTL (IV SOLUTION) ×2 IMPLANT
PAD ARMBOARD 7.5X6 YLW CONV (MISCELLANEOUS) ×4 IMPLANT
PENCIL BUTTON HOLSTER BLD 10FT (ELECTRODE) ×2 IMPLANT
SCISSORS LAP 5X35 DISP (ENDOMECHANICALS) ×2 IMPLANT
SET IRRIG TUBING LAPAROSCOPIC (IRRIGATION / IRRIGATOR) IMPLANT
SPONGE GAUZE 2X2 STER 10/PKG (GAUZE/BANDAGES/DRESSINGS) ×1
STRIP CLOSURE SKIN 1/2X4 (GAUZE/BANDAGES/DRESSINGS) ×2 IMPLANT
SUT MNCRL AB 4-0 PS2 18 (SUTURE) ×2 IMPLANT
SUT VIC AB 3-0 SH 18 (SUTURE) ×2 IMPLANT
SUT VICRYL 0 UR6 27IN ABS (SUTURE) ×2 IMPLANT
TOWEL OR 17X24 6PK STRL BLUE (TOWEL DISPOSABLE) ×2 IMPLANT
TOWEL OR 17X26 10 PK STRL BLUE (TOWEL DISPOSABLE) ×2 IMPLANT
TRAY LAPAROSCOPIC MC (CUSTOM PROCEDURE TRAY) ×2 IMPLANT
TROCAR XCEL BLADELESS 5X75MML (TROCAR) ×4 IMPLANT
TROCAR XCEL BLUNT TIP 100MML (ENDOMECHANICALS) ×2 IMPLANT
TUBING INSUFFLATION (TUBING) ×2 IMPLANT

## 2015-12-03 NOTE — Op Note (Signed)
12/03/2015  Eric Vang 09-Jun-1945   PREOPERATIVE DIAGNOSIS: right inguinal hernia.   POSTOPERATIVE DIAGNOSIS: right indirect inguinal hernia.   PROCEDURE: Laparoscopic repair of right indirect inguinal hernia with  mesh (TAPP).   SURGEON: Leighton Ruff. Redmond Pulling, MD   ASSISTANT SURGEON: None.   ANESTHESIA: General plus local consisting of 0.25% Marcaine with epi.   ESTIMATED BLOOD LOSS: Minimal.   FINDINGS: The patient had a right indirect inguinal hernia.  It was repaired using a large piece of Bard right-sided 3-D max mesh  SPECIMEN: None  INDICATIONS FOR PROCEDURE: 70 year old gentleman with a symptomatic right inguinal hernia presented for repair. Preoperatively we had extensive discussion regarding risk and benefits. Please see my outpatient H&P The risks and benefits including but not limited to bleeding, infection, chronic inguinal pain, nerve entrapment, hernia recurrence, mesh complications, hematoma formation, urinary retention, injury to the testicles or the ovaries, numbness in the groin, blood clots, injury to the surrounding structures, and anesthesia risk was discussed with the patient.  DESCRIPTION OF PROCEDURE: After obtaining verbal consent and marking  the right groin in the holding area with the patient confirming the  operative site, the patient was then taken back to the operating room, placed  supine on the operating room table. General endotracheal anesthesia was  established. The patient had emptied their bladder prior to going back to  the operating room. Sequential compression devices were placed. The  abdomen and groin were prepped and draped in the usual standard surgical  fashion with ChloraPrep. The patient received oral Tylenol as well as IV  antibiotics prior to the incision. A surgical time-out was performed.  Local was infiltrated at the base of the umbilicus.   He had a pre-existing small fascial defect at his umbilicus therefore I elected  to use the pre-existing fascial defect. A 3 cm transverse infraumbilical incision was made. I then tunneled up and above the umbilicus with a Kelly and separated the umbilical stalk from the fascial defect which contained a small plug of preperitoneal fat. The defect was about 1-1/2 cm.  Pursestring suture was placed around  the fascial edges using a 0 Vicryl. A 12-mm Hasson trocar was placed.  Pneumoperitoneum was smoothly established up to a patient pressure of 15  mmHg. Laparoscope was advanced. There was no evidence of a  contralateral hernia. The patient had a defect lateral to  the inferior epigastric vessel, consistent with an right indirect  hernia. Two 5-mm trocars were placed, one on the right, one on the left  in the midclavicular line slightly above the level of the umbilicus all  under direct visualization. After local had been infiltrated, I then  made incision along the peritoneum on the right, starting 2 inches above  the anterior superior iliac spine and caring it medial  toward the median umbilical ligament in a lazy S configuration using  Endo Shears with electrocautery. The peritoneal flap was then gently  dissected downward from the anterior abdominal wall taking care not to  injure the inferior epigastric vessels. The pubic bone was identified.  The testicular vessels were identified.  Using  traction and counter traction with short graspers, I reduced the sac in  its entirety. The testicular vessels had been identified and preserved. The vas deferens was identified and preserved, and the hernia sac was stripped from those to  surrounding structures. I then went about creating a large pocket by  lifting the peritoneum of the pelvic floor. I took great care not to  injure the iliac vessels.    Local anesthetic was injected 2 finger breadths below and medial to the anterior superior iliac spine as well as along the right groin prior to placing the mesh. I then obtained a  Large piece of Bard 3-D max mesh, placed it through the Hasson trocar, half of it covered medial  to the inferior epigastric vessels and half of it lateral to the  inferior epigastric vessels. The defect was well  covered with the mesh. I then secured the mesh to the abdominal wall  using an Ethicon secure strap tack. 4 Tacks were used to secure the mesh. Tacks were placed through  the Cooper's ligament, one tack on each side of the inferior epigastric  vessel and two tacks out laterally. No tacks were placed below the  shelving edge of the inguinal ligament. Pneumoperitoneum was reduced  to 8 mmHg. I then brought the peritoneal flap back up to the abdominal  wall and tacked it to the abdominal wall using 3 tacks. There was a small  defect in the peritoneum. I closed the defect in the peritoneum with a PDS Endoloop, and the mesh was well covered. I removed the  Hasson trocar and tied down the previously placed pursestring suture. I did place an additional interrupted 0 Vicryl suture at the umbilical fascia. The closure was viewed laparoscopically. There was no evidence of  fascial defect. There was no air leak at the umbilicus. There was no  evidence of injury to surrounding structures. Pneumoperitoneum was  released, and the remaining trocars were removed. All skin incisions  were closed with a 4-0 Monocryl in a subcuticular fashion followed by  application of benzoin, Steri-Strips, and sterile bandages. All needle, instrument, and sponge counts  were correct x2. There are no immediate complications. The patient  tolerated the procedure well. The patient was extubated and taken to the  recovery room in stable condition.  Leighton Ruff. Redmond Pulling, MD, FACS General, Bariatric, & Minimally Invasive Surgery [facility], [place]

## 2015-12-03 NOTE — Discharge Instructions (Signed)
[facility]  UMBILICAL OR INGUINAL HERNIA REPAIR: POST OP INSTRUCTIONS  Always review your discharge instruction sheet given to you by the facility where your surgery was performed. IF YOU HAVE DISABILITY OR FAMILY LEAVE FORMS, YOU MUST BRING THEM TO THE OFFICE FOR PROCESSING.   DO NOT GIVE THEM TO YOUR DOCTOR.  1. A  prescription for pain medication may be given to you upon discharge.  Take your pain medication as prescribed, if needed.  If narcotic pain medicine is not needed, then you may take acetaminophen (Tylenol) &/or ibuprofen (Advil) as needed. 2. Take your usually prescribed medications unless otherwise directed. 3. If you need a refill on your pain medication, please contact your pharmacy.  They will contact our office to request authorization. Prescriptions will not be filled after 5 pm or on week-ends. 4. You should follow a light diet the first 24 hours after arrival home, such as soup and crackers, etc.  Be sure to include lots of fluids daily.  Resume your normal diet the day after surgery. 5. Most patients will experience some swelling and bruising around the umbilicus or in the groin and scrotum.  Ice packs and reclining will help.  Swelling and bruising can take several days to resolve.  6. It is common to experience some constipation if taking pain medication after surgery.  Increasing fluid intake and taking a stool softener (such as Colace) will usually help or prevent this problem from occurring.  A mild laxative (Milk of Magnesia or Miralax) should be taken according to package directions if there are no bowel movements after 48 hours. 7. Unless discharge instructions indicate otherwise, you may remove your bandages 48 hours after surgery, and you may shower at that time.  You  have steri-strips (small skin tapes) in place directly over the incision.  These strips should be left on the skin for 7-10 days.  8. ACTIVITIES:  You may resume regular (light)  daily activities beginning the next day--such as daily self-care, walking, climbing stairs--gradually increasing activities as tolerated.  You may have sexual intercourse when it is comfortable.  Refrain from any heavy lifting or straining until approved by your doctor. a. You may drive when you are no longer taking prescription pain medication, you can comfortably wear a seatbelt, and you can safely maneuver your car and apply brakes. b. RETURN TO WORK:  9. You should see your doctor in the office for a follow-up appointment approximately 2-3 weeks after your surgery.  Make sure that you call for this appointment within a day or two after you arrive home to insure a convenient appointment time. 10. OTHER INSTRUCTIONS: DO NOT LIFT, PUSH, PULL ANYTHING GREATER THAN 15 LBS FOR 6 WEEKS; 11. APPLY ICE PACK TO RIGHT LOWER ABDOMEN AND GROIN FOR NEXT SEVERAL DAYS    WHEN TO CALL YOUR DOCTOR: 1. Fever over 101.0 2. Inability to urinate 3. Nausea and/or vomiting 4. Extreme swelling or bruising 5. Continued bleeding from incision. 6. Increased pain, redness, or drainage from the incision  The clinic staff is available to answer your questions during regular business hours.  Please dont hesitate to call and ask to speak to one of the nurses for clinical concerns.  If you have a medical emergency, go to the nearest emergency room or call 911.  A surgeon from [facility] is always on call at the hospital   [address], [place], [place], [place]  [address] ?  [id number],  [place], [place]   [address] [phone] ? [phone] ? FAX [phone] Web site: [email or web address]

## 2015-12-03 NOTE — Anesthesia Postprocedure Evaluation (Signed)
Anesthesia Post Note  Patient: Eric Vang  Procedure(s) Performed: Procedure(s) (LRB): LAPAROSCOPIC RIGHT INGUINAL HERNIA REPAIR (Right) INSERTION OF MESH (Right)  Patient location during evaluation: PACU Anesthesia Type: General Level of consciousness: awake Pain management: pain level controlled Respiratory status: spontaneous breathing Cardiovascular status: stable Anesthetic complications: no    Last Vitals:  Vitals:   12/03/15 1741 12/03/15 1745  BP: 110/78   Pulse: 83 81  Resp: 14 16  Temp:      Last Pain:  Vitals:   12/03/15 1726  PainSc: Asleep                 EDWARDS,[name]

## 2015-12-03 NOTE — Transfer of Care (Signed)
Immediate Anesthesia Transfer of Care Note  Patient: Eric Vang  Procedure(s) Performed: Procedure(s): LAPAROSCOPIC RIGHT INGUINAL HERNIA REPAIR (Right) INSERTION OF MESH (Right)  Patient Location: PACU  Anesthesia Type:General  Level of Consciousness: awake, alert , oriented and patient cooperative  Airway & Oxygen Therapy: Patient Spontanous Breathing and Patient connected to nasal cannula oxygen  Post-op Assessment: Report given to RN and Post -op Vital signs reviewed and stable  Post vital signs: Reviewed and stable  Last Vitals:  Vitals:   12/03/15 1313 12/03/15 1726  BP: 118/79   Pulse: 72   Resp: 20   Temp: 36.5 C (P) 36.6 C    Last Pain: There were no vitals filed for this visit.    Patients Stated Pain Goal: 3 ([id number])  Complications: No apparent anesthesia complications

## 2015-12-03 NOTE — H&P (Signed)
Eric Vang is an 70 y.o. male.   Chief Complaint: here for surgery HPI: The patient is a 70 year old male who presents with an inguinal hernia. He is referred by Dr Larose Kells for evaluation of a right inguinal hernia. The patient had done some yard work for several hours and the following day when getting out of the shower he noticed a bulge in his right groin. It was nontender. He was able to be reduced. He ended up seeing his primary care physician who confirmed a writing hernia. He also found a right scrotal mass. He had an ultrasound which showed a right epididymal cyst or spermatocele. He is also found to have a left varicocele as well as probable bilateral hydroceles. He has an appointment with Dr. Era Bumpers next week. He denies any fever, chills, nausea, vomiting, diarrhea or constipation. He denies any chest pain shows a breath. He does not smoke. He denies any groin burning, stabbing, shooting pain. It most it feels like a tugging sensation  He does endorse some nocturia and sensation of incomplete emptying  Past Medical History:  Diagnosis Date  . Basal cell carcinoma of nose 2017   removed  . CLUSTER HEADACHE 01/15/2007   x 1 time  . DIVERTICULOSIS, COLON 02/04/2008  . Elevated PSA    plan possible prostate Bx w/ urology  . Hyperlipidemia    started statin 2015   . Inguinal hernia    right  . Insomnia   . Scrotal varices   . Spermatocele of epididymis, single   . Unilateral inguinal hernia 2017    Past Surgical History:  Procedure Laterality Date  . CARDIAC CATHETERIZATION     > 20 years ago  . COLONOSCOPY    . no past surgeries      Family History  Problem Relation Age of Onset  . Diabetes Mother 32  . Atrial fibrillation Mother   . Cancer Father 19    bone   . Colon cancer Neg Hx   . Prostate cancer Neg Hx   . CAD Neg Hx    Social History:  reports that he has quit smoking. His smoking use included Cigarettes. He has never used smokeless  tobacco. He reports that he drinks alcohol. He reports that he does not use drugs.  Allergies: No Known Allergies  Medications Prior to Admission  Medication Sig Dispense Refill  . aspirin 81 MG tablet Take 81 mg by mouth daily.    Marland Kitchen ibuprofen (ADVIL,MOTRIN) 200 MG tablet Take 200 mg by mouth every 6 (six) hours as needed for moderate pain.    . simvastatin (ZOCOR) 10 MG tablet Take 1 tablet (10 mg total) by mouth daily. 90 tablet 1  . tamsulosin (FLOMAX) 0.4 MG CAPS capsule Take 0.4 mg by mouth daily.    . valACYclovir (VALTREX) 1000 MG tablet Take 1 tablet (1,000 mg total) by mouth 2 (two) times daily as needed (cold sores). For 5 days per episode 30 tablet 3    No results found for this or any previous visit (from the past 48 hour(s)). No results found.  Review of Systems  Constitutional: Negative for weight loss.  HENT: Negative for nosebleeds.   Eyes: Negative for blurred vision.  Respiratory: Negative for shortness of breath.   Cardiovascular: Negative for chest pain, palpitations, orthopnea and PND.       Denies DOE  Genitourinary: Negative for dysuria and hematuria.  Musculoskeletal: Negative.   Skin: Negative for itching and rash.  Neurological: Negative for dizziness, focal weakness, seizures, loss of consciousness and headaches.       Denies TIAs, amaurosis fugax  Endo/Heme/Allergies: Does not bruise/bleed easily.  Psychiatric/Behavioral: The patient is not nervous/anxious.     Blood pressure 118/79, pulse 72, temperature 97.7 F (36.5 C), resp. rate 20, SpO2 98 %. Physical Exam  Vitals reviewed. Constitutional: He is oriented to person, place, and time. He appears well-developed and well-nourished. No distress.  HENT:  Head: Normocephalic and atraumatic.  Right Ear: External ear normal.  Left Ear: External ear normal.  Eyes: Conjunctivae are normal. No scleral icterus.  Neck: Normal range of motion. Neck supple. No tracheal deviation present.  Cardiovascular:  Normal rate and normal heart sounds.   Respiratory: Effort normal and breath sounds normal. No stridor. No respiratory distress. He has no wheezes.  GI: Soft. He exhibits no distension. There is no tenderness.  Genitourinary:  Genitourinary Comments: +RIH reducible. Known right scrotal mass  Musculoskeletal: He exhibits no edema or tenderness.  Neurological: He is alert and oriented to person, place, and time. He exhibits normal muscle tone.  Skin: Skin is warm and dry. No rash noted. He is not diaphoretic. No erythema. No pallor.  Psychiatric: He has a normal mood and affect. His behavior is normal. Judgment and thought content normal.     Assessment/Plan Right inguinal hernia  Right epididymal cyst - non op per urology  To OR for lap repair of right inguinal hernia with mesh  All questions asked and answered  Leighton Ruff. Redmond Pulling, MD, FACS General, Bariatric, & Minimally Invasive Surgery [facility], [place]   Gayland Curry, MD 12/03/2015, 3:03 PM

## 2015-12-03 NOTE — Anesthesia Procedure Notes (Signed)
Procedure Name: Intubation Date/Time: 12/03/2015 3:40 PM Performed by: Jenne Campus Pre-anesthesia Checklist: Patient identified, Emergency Drugs available, Suction available and Patient being monitored Patient Re-evaluated:Patient Re-evaluated prior to inductionOxygen Delivery Method: Circle System Utilized Preoxygenation: Pre-oxygenation with 100% oxygen Intubation Type: IV induction Ventilation: Mask ventilation without difficulty Laryngoscope Size: Miller and 3 Grade View: Grade II Tube type: Oral Tube size: 7.5 mm Number of attempts: 1 Airway Equipment and Method: Stylet and Oral airway Placement Confirmation: ETT inserted through vocal cords under direct vision,  positive ETCO2 and breath sounds checked- equal and bilateral Secured at: 23 cm Tube secured with: Tape Dental Injury: Teeth and Oropharynx as per pre-operative assessment

## 2015-12-03 NOTE — Anesthesia Preprocedure Evaluation (Addendum)
Anesthesia Evaluation  Patient identified by MRN, date of birth, ID band Patient awake    History of Anesthesia Complications Negative for: history of anesthetic complications  Airway Mallampati: II  TM Distance: >3 FB     Dental  (+) Teeth Intact, Dental Advisory Given   Pulmonary former smoker,    breath sounds clear to auscultation       Cardiovascular negative cardio ROS   Rhythm:Regular Rate:Normal     Neuro/Psych    GI/Hepatic Neg liver ROS,   Endo/Other    Renal/GU negative Renal ROS     Musculoskeletal   Abdominal   Peds  Hematology   Anesthesia Other Findings   Reproductive/Obstetrics                           Anesthesia Physical Anesthesia Plan  ASA: II  Anesthesia Plan:    Post-op Pain Management:    Induction: Intravenous  Airway Management Planned: Oral ETT  Additional Equipment:   Intra-op Plan:   Post-operative Plan: Extubation in OR  Informed Consent:   Plan Discussed with:   Anesthesia Plan Comments:         Anesthesia Quick Evaluation

## 2015-12-04 ENCOUNTER — Encounter (HOSPITAL_COMMUNITY): Payer: Self-pay | Admitting: General Surgery

## 2015-12-25 ENCOUNTER — Other Ambulatory Visit: Payer: Self-pay | Admitting: Internal Medicine

## 2015-12-28 ENCOUNTER — Telehealth: Payer: Self-pay | Admitting: Internal Medicine

## 2015-12-28 DIAGNOSIS — Z1211 Encounter for screening for malignant neoplasm of colon: Secondary | ICD-10-CM

## 2015-12-28 NOTE — Telephone Encounter (Signed)
Patient says he is due for his colonoscopy in November and would like to get his orders for the referral started. Please advise.   Patient phone: [phone] Patient Relation: self

## 2015-12-28 NOTE — Telephone Encounter (Signed)
Referral placed.

## 2015-12-29 ENCOUNTER — Encounter: Payer: Self-pay | Admitting: Gastroenterology

## 2016-01-03 DIAGNOSIS — Z23 Encounter for immunization: Secondary | ICD-10-CM | POA: Diagnosis not present

## 2016-01-05 ENCOUNTER — Telehealth: Payer: Self-pay | Admitting: Internal Medicine

## 2016-01-05 NOTE — Telephone Encounter (Signed)
due for a PSA, please arrange, DX increased PSA

## 2016-01-05 NOTE — Telephone Encounter (Signed)
PSA ordered. MyChart message sent to Pt.

## 2016-01-18 ENCOUNTER — Telehealth: Payer: Self-pay | Admitting: Internal Medicine

## 2016-01-18 NOTE — Telephone Encounter (Signed)
Patient Name: ED Lemon  DOB: 04-03-1946    Initial Comment Caller states he has a cold, and they're going to visit a friend who has Cancer. What should they do. His wife has a cold as well.   Nurse Assessment  Nurse: Verlin Fester RN, Stanton Kidney Date/Time Eilene Ghazi Time): 01/18/2016 11:25:13 AM  Confirm and document reason for call. If symptomatic, describe symptoms. You must click the next button to save text entered. ---Caller states he and his wife has a cold and they want to go visit a friend that has cancer. Asking if they can visit her and stay in her home.  Has the patient traveled out of the country within the last 30 days? ---No  Does the patient have any new or worsening symptoms? ---Yes  Will a triage be completed? ---Yes  Related visit to physician within the last 2 weeks? ---No  Does the PT have any chronic conditions? (i.e. diabetes, asthma, etc.) ---No  Is this a behavioral health or substance abuse call? ---No     Guidelines    Guideline Title Affirmed Question Affirmed Notes  Common Cold Cold with no complications (all triage questions negative)    Final Disposition User   Cottle, RN, Stanton Kidney    Comments  After triage patient instructed that colds are very contagious and he should check with his friend and find out where she is in her treatment and if she is allowed to have visitors since cancer patients have a decreased immunity if they are on treatment. After talking to patient they can make a better decision on if they should visit.   Disagree/Comply: Comply

## 2016-02-05 ENCOUNTER — Telehealth: Payer: Self-pay | Admitting: Internal Medicine

## 2016-02-05 DIAGNOSIS — Z0000 Encounter for general adult medical examination without abnormal findings: Secondary | ICD-10-CM | POA: Diagnosis not present

## 2016-02-05 DIAGNOSIS — Z6828 Body mass index (BMI) 28.0-28.9, adult: Secondary | ICD-10-CM | POA: Diagnosis not present

## 2016-02-05 DIAGNOSIS — E785 Hyperlipidemia, unspecified: Secondary | ICD-10-CM | POA: Diagnosis not present

## 2016-02-05 NOTE — Telephone Encounter (Signed)
thx

## 2016-02-05 NOTE — Telephone Encounter (Signed)
A nurse that works for [employer] came out to do a home check on patient for [employer]. She recommended that patient have an Abdominal Aortic Aneurism Screening and also Hep C screening as well. He would like more information on these screenings and to possibly set them up. Please advise  Patient Phone: [phone]

## 2016-02-05 NOTE — Telephone Encounter (Signed)
Please advise 

## 2016-02-05 NOTE — Telephone Encounter (Signed)
Spoke w/ Pt, informed him that Medicare is not covering Hep C screening tests but if is interested we can definitely have drawn. He would like to wait to discuss with PCP at next visit the AAA screening and Hep C screening until next visit. He will call if he changes his mind. Informed Pt that he was also due for repeat PSA, Pt informed he is currently seeing Urology and next recheck is scheduled for 04/2016.

## 2016-02-05 NOTE — Telephone Encounter (Signed)
That is okay. Schedule a ultrasound of the aorta, DX a screening for AAA. Also, schedule blood work: PSA, dx elevated PSA Hepatitis C serology, dx hepatitis C screening

## 2016-02-08 ENCOUNTER — Ambulatory Visit (AMBULATORY_SURGERY_CENTER): Payer: Self-pay | Admitting: *Deleted

## 2016-02-08 VITALS — Ht 73.0 in | Wt 203.0 lb

## 2016-02-08 DIAGNOSIS — Z1211 Encounter for screening for malignant neoplasm of colon: Secondary | ICD-10-CM

## 2016-02-08 MED ORDER — NA SULFATE-K SULFATE-MG SULF 17.5-3.13-1.6 GM/177ML PO SOLN: ORAL | 0 refills | Status: DC

## 2016-02-08 NOTE — Progress Notes (Signed)
Patient denies any allergies to eggs or soy. Patient denies any problems with anesthesia/sedation. Patient denies any oxygen use at home and does not take any diet/weight loss medications. Pt declined EMMI education. 

## 2016-02-09 ENCOUNTER — Encounter: Payer: Self-pay | Admitting: Gastroenterology

## 2016-02-22 ENCOUNTER — Ambulatory Visit (AMBULATORY_SURGERY_CENTER): Payer: Medicare HMO | Admitting: Gastroenterology

## 2016-02-22 ENCOUNTER — Encounter: Payer: Self-pay | Admitting: Gastroenterology

## 2016-02-22 VITALS — BP 117/74 | HR 59 | Temp 97.3°F | Resp 20 | Ht 73.0 in | Wt 203.0 lb

## 2016-02-22 DIAGNOSIS — Z1211 Encounter for screening for malignant neoplasm of colon: Secondary | ICD-10-CM

## 2016-02-22 DIAGNOSIS — D122 Benign neoplasm of ascending colon: Secondary | ICD-10-CM | POA: Diagnosis not present

## 2016-02-22 DIAGNOSIS — K633 Ulcer of intestine: Secondary | ICD-10-CM | POA: Diagnosis not present

## 2016-02-22 DIAGNOSIS — D120 Benign neoplasm of cecum: Secondary | ICD-10-CM | POA: Diagnosis not present

## 2016-02-22 DIAGNOSIS — Z1212 Encounter for screening for malignant neoplasm of rectum: Secondary | ICD-10-CM | POA: Diagnosis not present

## 2016-02-22 DIAGNOSIS — D123 Benign neoplasm of transverse colon: Secondary | ICD-10-CM | POA: Diagnosis not present

## 2016-02-22 MED ORDER — SODIUM CHLORIDE 0.9 % IV SOLN: 500.0000 mL | INTRAVENOUS | Status: DC

## 2016-02-22 NOTE — Op Note (Signed)
[facility] Patient Name: Eric Vang Procedure Date: 02/22/2016 8:05 AM MRN: [id number] Endoscopist: Mauri Pole , MD Age: 70 Referring MD:  Date of Birth: 11-Feb-1946 Gender: Male Account #: [id number] Procedure:                Colonoscopy Indications:              Screening for colorectal malignant neoplasm, Last                            colonoscopy: 2007 Medicines:                Monitored Anesthesia Care Procedure:                Pre-Anesthesia Assessment:                           - Prior to the procedure, a History and Physical                            was performed, and patient medications and                            allergies were reviewed. The patient's tolerance of                            previous anesthesia was also reviewed. The risks                            and benefits of the procedure and the sedation                            options and risks were discussed with the patient.                            All questions were answered, and informed consent                            was obtained. Prior Anticoagulants: The patient has                            taken no previous anticoagulant or antiplatelet                            agents. ASA Grade Assessment: II - A patient with                            mild systemic disease. After reviewing the risks                            and benefits, the patient was deemed in                            satisfactory condition to undergo the procedure.  After obtaining informed consent, the colonoscope                            was passed under direct vision. Throughout the                            procedure, the patient's blood pressure, pulse, and                            oxygen saturations were monitored continuously. The                            Model CF-HQ190L [id number]) scope was introduced                            through the anus and advanced to the  the cecum,                            identified by appendiceal orifice and ileocecal                            valve. The colonoscopy was performed without                            difficulty. The patient tolerated the procedure                            well. The quality of the bowel preparation was                            excellent. The terminal ileum, ileocecal valve,                            appendiceal orifice, and rectum were photographed. Scope In: 8:25:02 AM Scope Out: 8:44:02 AM Scope Withdrawal Time: 0 hours 15 minutes 10 seconds  Total Procedure Duration: 0 hours 19 minutes 0 seconds  Findings:                 Two sessile polyps were found in the ascending                            colon. The polyps were 1 to 2 mm in size. These                            polyps were removed with a cold biopsy forceps.                            Resection and retrieval were complete.                           Nonbleeding mucosal erosion with no stigmata of                            recent bleeding were present in  the transverse                            colon. Biopsies were taken with a cold forceps for                            histology, followed by vrisk ooze, cautery with                            snare tip, soft coag was unsuccessful. To prevent                            bleeding after the biopsy, one hemostatic clip was                            successfully placed (MR conditional). There was no                            bleeding at the end of the procedure.                           Non-bleeding internal hemorrhoids were found during                            retroflexion. The hemorrhoids were small. Complications:            No immediate complications. Estimated Blood Loss:     Estimated blood loss was minimal. Impression:               - Two 1 to 2 mm polyps in the ascending colon,                            removed with a cold biopsy forceps. Resected and                             retrieved.                           - Mucosal erosion. Biopsied. Clip (MR conditional)                            was placed.                           - Non-bleeding internal hemorrhoids. Recommendation:           - Patient has a contact number available for                            emergencies. The signs and symptoms of potential                            delayed complications were discussed with the                            patient. Return to normal  activities tomorrow.                            Written discharge instructions were provided to the                            patient.                           - Resume previous diet.                           - Continue present medications.                           - Await pathology results.                           - Repeat colonoscopy in 5-10 years for surveillance                            based on pathology results.                           - Patient has a contact number available for                            emergencies. The signs and symptoms of potential                            delayed complications were discussed with the                            patient. Return to normal activities tomorrow.                            Written discharge instructions were provided to the                            patient.                           - No aspirin, ibuprofen, naproxen, or other                            non-steroidal anti-inflammatory drugs. Mauri Pole, MD 02/22/2016 8:55:20 AM This report has been signed electronically.

## 2016-02-22 NOTE — Progress Notes (Signed)
A/ox3, pleased with MAC, report to RN 

## 2016-02-22 NOTE — Patient Instructions (Signed)
YOU HAD AN ENDOSCOPIC PROCEDURE TODAY AT [facility]:   Refer to the procedure report that was given to you for any specific questions about what was found during the examination.  If the procedure report does not answer your questions, please call your gastroenterologist to clarify.  If you requested that your care partner not be given the details of your procedure findings, then the procedure report has been included in a sealed envelope for you to review at your convenience later.  YOU SHOULD EXPECT: Some feelings of bloating in the abdomen. Passage of more gas than usual.  Walking can help get rid of the air that was put into your GI tract during the procedure and reduce the bloating. If you had a lower endoscopy (such as a colonoscopy or flexible sigmoidoscopy) you may notice spotting of blood in your stool or on the toilet paper. If you underwent a bowel prep for your procedure, you may not have a normal bowel movement for a few days.  Please Note:  You might notice some irritation and congestion in your nose or some drainage.  This is from the oxygen used during your procedure.  There is no need for concern and it should clear up in a day or so.  SYMPTOMS TO REPORT IMMEDIATELY:   Following lower endoscopy (colonoscopy or flexible sigmoidoscopy):  Excessive amounts of blood in the stool  Significant tenderness or worsening of abdominal pains  Swelling of the abdomen that is new, acute  Fever of 100F or higher   For urgent or emergent issues, a gastroenterologist can be reached at any hour by calling [phone].   DIET:  We do recommend a small meal at first, but then you may proceed to your regular diet.  Drink plenty of fluids but you should avoid alcoholic beverages for 24 hours.  ACTIVITY:  You should plan to take it easy for the rest of today and you should NOT DRIVE or use heavy machinery until tomorrow (because of the sedation medicines used during the test).     FOLLOW UP: Our staff will call the number listed on your records the next business day following your procedure to check on you and address any questions or concerns that you may have regarding the information given to you following your procedure. If we do not reach you, we will leave a message.  However, if you are feeling well and you are not experiencing any problems, there is no need to return our call.  We will assume that you have returned to your regular daily activities without incident.  If any biopsies were taken you will be contacted by phone or by letter within the next 1-3 weeks.  Please call us at [phone] if you have not heard about the biopsies in 3 weeks.    SIGNATURES/CONFIDENTIALITY: You and/or your care partner have signed paperwork which will be entered into your electronic medical record.  These signatures attest to the fact that that the information above on your After Visit Summary has been reviewed and is understood.  Full responsibility of the confidentiality of this discharge information lies with you and/or your care-partner.   No aspirin,ibuprofen,naproxen,or other non-steroidal anti-inflammatory drugs. Resume remainder of medications. Information given on polyps and hemorrhoids.

## 2016-02-23 ENCOUNTER — Telehealth: Payer: Self-pay | Admitting: *Deleted

## 2016-02-23 NOTE — Telephone Encounter (Signed)
  Follow up Call-  Call back number 02/22/2016  Post procedure Call Back phone  # [phone]  Permission to leave phone message Yes  Some recent data might be hidden     Patient questions:  Do you have a fever, pain , or abdominal swelling? No. Pain Score  0 *  Have you tolerated food without any problems? Yes.    Have you been able to return to your normal activities? Yes.    Do you have any questions about your discharge instructions: Diet   No. Medications  No. Follow up visit  No.  Do you have questions or concerns about your Care? No.  Actions: * If pain score is 4 or above: No action needed, pain <4.

## 2016-03-02 ENCOUNTER — Ambulatory Visit (INDEPENDENT_AMBULATORY_CARE_PROVIDER_SITE_OTHER): Payer: Medicare HMO | Admitting: Internal Medicine

## 2016-03-02 ENCOUNTER — Encounter: Payer: Self-pay | Admitting: Internal Medicine

## 2016-03-02 VITALS — BP 126/74 | HR 79 | Temp 98.2°F | Resp 12 | Ht 73.0 in | Wt 200.4 lb

## 2016-03-02 DIAGNOSIS — G629 Polyneuropathy, unspecified: Secondary | ICD-10-CM

## 2016-03-02 DIAGNOSIS — R351 Nocturia: Secondary | ICD-10-CM | POA: Diagnosis not present

## 2016-03-02 DIAGNOSIS — Z136 Encounter for screening for cardiovascular disorders: Secondary | ICD-10-CM

## 2016-03-02 DIAGNOSIS — R972 Elevated prostate specific antigen [PSA]: Secondary | ICD-10-CM | POA: Diagnosis not present

## 2016-03-02 MED ORDER — TAMSULOSIN HCL 0.4 MG PO CAPS: 0.4000 mg | ORAL_CAPSULE | Freq: Every day | ORAL | 6 refills | Status: DC

## 2016-03-02 NOTE — Patient Instructions (Signed)
GO TO THE LAB : Get the blood work     GO TO THE FRONT DESK Schedule your next appointment for a  Physical by 08-2016

## 2016-03-02 NOTE — Progress Notes (Signed)
Pre visit review using our clinic review tool, if applicable. No additional management support is needed unless otherwise documented below in the visit note. 

## 2016-03-02 NOTE — Progress Notes (Signed)
Subjective:    Patient ID: Eric Vang, male    DOB: [birth year], 70 y.o.   MRN: [id number]  DOS:  03/02/2016 Type of visit - description : Acute, several issues Interval history: Request a screening for AAA Ongoing  nocturia, sometimes up to 5 times at night, ready for some medication. Also having symptoms of neuropathy: Complaining of tingling in the toes particularly big toes B, few times the feeling is burning, or a "hypersensitivity", worse at night. Denies symptoms being in a sock distribution. No back pain or numbness of the lower extremities. No rash.   Review of Systems No low back pain Denies claudication No dysuria, gross hematuria difficulty urinating.  Past Medical History:  Diagnosis Date  . Basal cell carcinoma of nose 2017   removed  . CLUSTER HEADACHE 01/15/2007   x 1 time  . DIVERTICULOSIS, COLON 02/04/2008  . Elevated PSA    plan possible prostate Bx w/ urology  . Hyperlipidemia    started statin 2015   . Inguinal hernia    right  . Insomnia   . Scrotal varices   . Spermatocele of epididymis, single   . Unilateral inguinal hernia 2017    Past Surgical History:  Procedure Laterality Date  . CARDIAC CATHETERIZATION     > 20 years ago  . COLONOSCOPY    . INGUINAL HERNIA REPAIR Right 12/03/2015   Procedure: LAPAROSCOPIC RIGHT INGUINAL HERNIA REPAIR;  Surgeon: [name], MD;  Location: [place];  Service: General;  Laterality: Right;  . INSERTION OF MESH Right 12/03/2015   Procedure: INSERTION OF MESH;  Surgeon: [name], MD;  Location: [place];  Service: General;  Laterality: Right;  . no past surgeries      Social History   Social History  . Marital status: Married    Spouse name: N/A  . Number of children: 1  . Years of education: N/A   Occupational History  . retired Scientist, water quality, works part time now    Social History Main Topics  . Smoking status: Former Smoker    Types: Cigarettes  . Smokeless tobacco: Never Used     Comment:  stopped in 1979  . Alcohol use 0.0 oz/week     Comment: beer or wine weekly per pt  . Drug use: No  . Sexual activity: Not on file   Other Topics Concern  . Not on file   Social History Narrative   Moved from [place] . Step daughter is a Therapist, sports   Sister in Sports coach [facility]         Medication List       Accurate as of 03/02/16 11:59 PM. Always use your most recent med list.          aspirin EC 81 MG tablet Take 81 mg by mouth daily.   ibuprofen 200 MG tablet Commonly known as:  ADVIL,MOTRIN Take 200 mg by mouth every 6 (six) hours as needed for moderate pain.   simvastatin 10 MG tablet Commonly known as:  ZOCOR Take 1 tablet (10 mg total) by mouth daily.   tamsulosin 0.4 MG Caps capsule Commonly known as:  FLOMAX Take 1 capsule (0.4 mg total) by mouth daily after supper.   valACYclovir 1000 MG tablet Commonly known as:  VALTREX Take 1 tablet (1,000 mg total) by mouth 2 (two) times daily as needed (cold sores). For 5 days per episode          Objective:   Physical  Exam BP 126/74 (BP Location: Left Arm, Patient Position: Sitting, Cuff Size: Normal)   Pulse 79   Temp 98.2 F (36.8 C) (Oral)   Resp 12   Ht 6\' 1"  (1.854 m)   Wt 200 lb 6 oz (90.9 kg)   SpO2 98%   BMI 26.44 kg/m  General:   Well developed, well nourished . NAD.  HEENT:  Normocephalic . Face symmetric, atraumatic Abdomen:  Not distended, soft, non-tender. No rebound or rigidity. Palpable aorta upper abdomen? No bruit Lower extremities: No edema, good pedal pulses Skin: Not pale. Not jaundice Neurologic:  alert & oriented X3.  Speech normal, gait appropriate for age and unassisted DTRs symmetric. Pinprick examination lower extremities: Normal except for one small area at the left great toe. Psych--  Cognition and judgment appear intact.  Cooperative with normal attention span and concentration.  Behavior appropriate. No anxious or depressed appearing.    Assessment & Plan:    Assessment Hyperlipidemia , rx statins 2015 Insomnia BCC, nose 2017 Cold sores, prn Valtrex H/o cluster HAs 1 -- 2008 Elevated PSA 08-2015, UCX (-), s/p abx, referred to urology  PLAN: Neuropathy? Sx to suggest neuropathy. Exam is essentially benign, no evidence of peripheral vascular disease or radiculopathy on clinical grounds. Will start a workup by checking a 123456, folic acid, sedimentation rate, RPR and A1c. At this point there is no need for treatment. If sx increase will strongly consider further eval. Schedule a ultrasound to screen for aortic aneurysm Nocturia: Start Flomax  Elevated PSA: Saw urology 10-2015, note reviewed, f/u  PSA decreased, they're planning to check another one in few months. RTC 08-2015, CPX.

## 2016-03-03 LAB — SEDIMENTATION RATE: SED RATE: 4 mm/h (ref 0–20)

## 2016-03-03 LAB — FOLATE: FOLATE: 10.8 ng/mL (ref >5.9)

## 2016-03-03 LAB — HEMOGLOBIN A1C: Hgb A1c MFr Bld: 5.3 % (ref 4.6–6.5)

## 2016-03-03 LAB — RPR

## 2016-03-03 LAB — VITAMIN B12: Vitamin B-12: 371 pg/mL (ref 211–911)

## 2016-03-03 NOTE — Assessment & Plan Note (Signed)
Neuropathy? Sx to suggest neuropathy. Exam is essentially benign, no evidence of peripheral vascular disease or radiculopathy on clinical grounds. Will start a workup by checking a 123456, folic acid, sedimentation rate, RPR and A1c. At this point there is no need for treatment. If sx increase will strongly consider further eval. Schedule a ultrasound to screen for aortic aneurysm Nocturia: Start Flomax  Elevated PSA: Saw urology 10-2015, note reviewed, f/u  PSA decreased, they're planning to check another one in few months. RTC 08-2015, CPX.

## 2016-03-07 ENCOUNTER — Encounter: Payer: Self-pay | Admitting: Gastroenterology

## 2016-03-07 ENCOUNTER — Ambulatory Visit (HOSPITAL_BASED_OUTPATIENT_CLINIC_OR_DEPARTMENT_OTHER)
Admission: RE | Admit: 2016-03-07 | Discharge: 2016-03-07 | Disposition: A | Discharge status: Home / Self Care | Payer: Medicare HMO | Source: Ambulatory Visit | Attending: Internal Medicine | Admitting: Internal Medicine

## 2016-03-07 DIAGNOSIS — Z87891 Personal history of nicotine dependence: Secondary | ICD-10-CM | POA: Diagnosis not present

## 2016-03-07 DIAGNOSIS — Z136 Encounter for screening for cardiovascular disorders: Secondary | ICD-10-CM | POA: Diagnosis not present

## 2016-04-05 ENCOUNTER — Encounter: Payer: Self-pay | Admitting: Internal Medicine

## 2016-04-05 DIAGNOSIS — R972 Elevated prostate specific antigen [PSA]: Secondary | ICD-10-CM | POA: Diagnosis not present

## 2016-04-19 ENCOUNTER — Ambulatory Visit (INDEPENDENT_AMBULATORY_CARE_PROVIDER_SITE_OTHER): Payer: Medicare HMO | Admitting: Internal Medicine

## 2016-04-19 ENCOUNTER — Encounter: Payer: Self-pay | Admitting: Internal Medicine

## 2016-04-19 VITALS — BP 128/76 | HR 66 | Temp 99.8°F | Resp 14 | Ht 73.0 in | Wt 203.5 lb

## 2016-04-19 DIAGNOSIS — B349 Viral infection, unspecified: Secondary | ICD-10-CM

## 2016-04-19 DIAGNOSIS — J029 Acute pharyngitis, unspecified: Secondary | ICD-10-CM | POA: Diagnosis not present

## 2016-04-19 DIAGNOSIS — R509 Fever, unspecified: Secondary | ICD-10-CM | POA: Diagnosis not present

## 2016-04-19 LAB — POCT RAPID STREP A (OFFICE): RAPID STREP A SCREEN: NEGATIVE

## 2016-04-19 LAB — POCT INFLUENZA A: RAPID INFLUENZA A AGN: NEGATIVE

## 2016-04-19 MED ORDER — OSELTAMIVIR PHOSPHATE 75 MG PO CAPS: 75.0000 mg | ORAL_CAPSULE | Freq: Two times a day (BID) | ORAL | 0 refills | Status: DC

## 2016-04-19 NOTE — Progress Notes (Signed)
Subjective:    Patient ID: Eric Vang, male    DOB: 1945-12-07, 71 y.o.   MRN: [id number]  DOS:  04/19/2016 Type of visit - description : Acute visit Interval history: 2 days ago felt tired and developed mild sore throat, yesterday developed cough, some headaches and chills. His nephew has been diagnosed with the flu and prescribe Tamiflu. His sister-in-law is developing the same symptoms.   Review of Systems No fevers prior to the office visit, temperature now is 99.8. No nausea or vomiting. No major myalgias No rash + Nasal congestion. No difficulty breathing, had a couple of episodes of right-sided chest pain with movement, CP lasted a few seconds  Past Medical History:  Diagnosis Date  . Basal cell carcinoma of nose 2017   removed  . CLUSTER HEADACHE 01/15/2007   x 1 time  . DIVERTICULOSIS, COLON 02/04/2008  . Elevated PSA    plan possible prostate Bx w/ urology  . Hyperlipidemia    started statin 2015   . Inguinal hernia    right  . Insomnia   . Scrotal varices   . Spermatocele of epididymis, single   . Unilateral inguinal hernia 2017    Past Surgical History:  Procedure Laterality Date  . CARDIAC CATHETERIZATION     > 20 years ago  . COLONOSCOPY    . INGUINAL HERNIA REPAIR Right 12/03/2015   Procedure: LAPAROSCOPIC RIGHT INGUINAL HERNIA REPAIR;  Surgeon: [name], MD;  Location: [place];  Service: General;  Laterality: Right;  . INSERTION OF MESH Right 12/03/2015   Procedure: INSERTION OF MESH;  Surgeon: [name], MD;  Location: [place];  Service: General;  Laterality: Right;  . no past surgeries      Social History   Social History  . Marital status: Married    Spouse name: N/A  . Number of children: 1  . Years of education: N/A   Occupational History  . retired Scientist, water quality, works part time now    Social History Main Topics  . Smoking status: Former Smoker    Types: Cigarettes  . Smokeless tobacco: Never Used     Comment: stopped in  1979  . Alcohol use 0.0 oz/week     Comment: beer or wine weekly per pt  . Drug use: No  . Sexual activity: Not on file   Other Topics Concern  . Not on file   Social History Narrative   Moved from [place] . Step daughter is a Therapist, sports   Sister in law [place] as of 04/19/2016   No Known Allergies     Medication List       Accurate as of 04/19/16  9:39 PM. Always use your most recent med list.          aspirin EC 81 MG tablet Take 81 mg by mouth daily.   ibuprofen 200 MG tablet Commonly known as:  ADVIL,MOTRIN Take 200 mg by mouth every 6 (six) hours as needed for moderate pain.   oseltamivir 75 MG capsule Commonly known as:  TAMIFLU Take 1 capsule (75 mg total) by mouth 2 (two) times daily.   simvastatin 10 MG tablet Commonly known as:  ZOCOR Take 1 tablet (10 mg total) by mouth daily.   tamsulosin 0.4 MG Caps capsule Commonly known as:  FLOMAX Take 1 capsule (0.4 mg total) by mouth daily after supper.   valACYclovir 1000 MG tablet Commonly known as:  VALTREX Take 1 tablet (1,000 mg total) by mouth 2 (two) times daily as needed (cold sores). For 5 days per episode          Objective:   Physical Exam BP 128/76 (BP Location: Left Arm, Patient Position: Sitting, Cuff Size: Normal)   Pulse 66   Temp 99.8 F (37.7 C) (Oral)   Resp 14   Ht 6\' 1"  (1.854 m)   Wt 203 lb 8 oz (92.3 kg)   SpO2 97%   BMI 26.85 kg/m  General:   Well developed, well nourished . NAD.  HEENT:  Normocephalic . Face symmetric, atraumatic. TMs normal. Throat normal. Nose is slightly congested. Sinuses no TTP Lungs:  CTA B Normal respiratory effort, no intercostal retractions, no accessory muscle use. Heart: RRR,  no murmur.  No pretibial edema bilaterally  Skin: Not pale. Not jaundice Neurologic:  alert & oriented X3.  Speech normal, gait appropriate for age and unassisted Psych--  Cognition and judgment appear intact.  Cooperative with normal  attention span and concentration.  Behavior appropriate. No anxious or depressed appearing.      Assessment & Plan:   Assessment Hyperlipidemia , rx statins 2015 Insomnia BCC, nose 2017 Cold sores, prn Valtrex H/o cluster HAs 1 -- 2008 Elevated PSA 08-2015, UCX (-), s/p abx, referred to urology  PLAN: Viral syndrome: Symptoms started 2 days ago, sx c/w viral syndrome, he has been exposed to the flu and there are a # of pt in the community w/ documented influenza   Flu test negative. Will treat empirically w/anti-virals since he is still in the window when  Tamiflu would help. See instructions.

## 2016-04-19 NOTE — Patient Instructions (Signed)
Rest, fluids , tylenol  For cough:  Take Mucinex DM twice a day as needed until better  For nasal congestion: Use OTC Nasocort or Flonase : 2 nasal sprays on each side of the nose in the morning until you feel better   Avoid decongestants such as  Pseudoephedrine or phenylephrine     Take TAMIFLU x 5 days   Call if not gradually better over the next  10 days  Call anytime if the symptoms are severe or if you get better and then worse

## 2016-04-19 NOTE — Progress Notes (Signed)
Pre visit review using our clinic review tool, if applicable. No additional management support is needed unless otherwise documented below in the visit note. 

## 2016-04-19 NOTE — Assessment & Plan Note (Signed)
Viral syndrome: Symptoms started 2 days ago, sx c/w viral syndrome, he has been exposed to the flu and there are a # of pt in the community w/ documented influenza   Flu test negative. Will treat empirically w/anti-virals since he is still in the window when  Tamiflu would help. See instructions.

## 2016-04-29 DIAGNOSIS — D2272 Melanocytic nevi of left lower limb, including hip: Secondary | ICD-10-CM | POA: Diagnosis not present

## 2016-04-29 DIAGNOSIS — L821 Other seborrheic keratosis: Secondary | ICD-10-CM | POA: Diagnosis not present

## 2016-04-29 DIAGNOSIS — D2261 Melanocytic nevi of right upper limb, including shoulder: Secondary | ICD-10-CM | POA: Diagnosis not present

## 2016-04-29 DIAGNOSIS — D1801 Hemangioma of skin and subcutaneous tissue: Secondary | ICD-10-CM | POA: Diagnosis not present

## 2016-04-29 DIAGNOSIS — Z85828 Personal history of other malignant neoplasm of skin: Secondary | ICD-10-CM | POA: Diagnosis not present

## 2016-04-29 DIAGNOSIS — L812 Freckles: Secondary | ICD-10-CM | POA: Diagnosis not present

## 2016-08-09 DIAGNOSIS — H524 Presbyopia: Secondary | ICD-10-CM | POA: Diagnosis not present

## 2016-08-09 DIAGNOSIS — I10 Essential (primary) hypertension: Secondary | ICD-10-CM | POA: Diagnosis not present

## 2016-08-09 DIAGNOSIS — H5203 Hypermetropia, bilateral: Secondary | ICD-10-CM | POA: Diagnosis not present

## 2016-08-09 DIAGNOSIS — H52223 Regular astigmatism, bilateral: Secondary | ICD-10-CM | POA: Diagnosis not present

## 2016-08-17 ENCOUNTER — Ambulatory Visit (INDEPENDENT_AMBULATORY_CARE_PROVIDER_SITE_OTHER): Payer: Medicare HMO | Admitting: Internal Medicine

## 2016-08-17 ENCOUNTER — Encounter: Payer: Self-pay | Admitting: Internal Medicine

## 2016-08-17 VITALS — BP 108/68 | HR 71 | Temp 97.8°F | Resp 16 | Ht 73.0 in | Wt 194.2 lb

## 2016-08-17 DIAGNOSIS — Z125 Encounter for screening for malignant neoplasm of prostate: Secondary | ICD-10-CM | POA: Diagnosis not present

## 2016-08-17 DIAGNOSIS — Z0000 Encounter for general adult medical examination without abnormal findings: Secondary | ICD-10-CM

## 2016-08-17 LAB — PSA: PSA: 3.39 ng/mL (ref 0.10–4.00)

## 2016-08-17 LAB — CBC WITH DIFFERENTIAL/PLATELET
BASOS ABS: 0 10*3/uL (ref 0.0–0.1)
BASOS PCT: 0.9 % (ref 0.0–3.0)
EOS PCT: 3.6 % (ref 0.0–5.0)
Eosinophils Absolute: 0.2 10*3/uL (ref 0.0–0.7)
HEMATOCRIT: 44.7 % (ref 39.0–52.0)
Hemoglobin: 15.4 g/dL (ref 13.0–17.0)
LYMPHS PCT: 27.5 % (ref 12.0–46.0)
Lymphs Abs: 1.3 10*3/uL (ref 0.7–4.0)
MCHC: 34.5 g/dL (ref 30.0–36.0)
MCV: 97.8 fl (ref 78.0–100.0)
MONOS PCT: 8.2 % (ref 3.0–12.0)
Monocytes Absolute: 0.4 10*3/uL (ref 0.1–1.0)
NEUTROS PCT: 59.8 % (ref 43.0–77.0)
Neutro Abs: 2.9 10*3/uL (ref 1.4–7.7)
PLATELETS: 265 10*3/uL (ref 150.0–400.0)
RBC: 4.57 Mil/uL (ref 4.22–5.81)
RDW: 13.3 % (ref 11.5–15.5)
WBC: 4.9 10*3/uL (ref 4.0–10.5)

## 2016-08-17 LAB — COMPREHENSIVE METABOLIC PANEL
ALT: 19 U/L (ref 0–53)
AST: 23 U/L (ref 0–37)
Albumin: 4.5 g/dL (ref 3.5–5.2)
Alkaline Phosphatase: 51 U/L (ref 39–117)
BUN: 20 mg/dL (ref 6–23)
CALCIUM: 9.7 mg/dL (ref 8.4–10.5)
CHLORIDE: 106 meq/L (ref 96–112)
CO2: 26 meq/L (ref 19–32)
Creatinine, Ser: 0.91 mg/dL (ref 0.40–1.50)
GFR: 87.74 mL/min (ref >60.00)
GLUCOSE: 93 mg/dL (ref 70–99)
POTASSIUM: 4.3 meq/L (ref 3.5–5.1)
Sodium: 140 mEq/L (ref 135–145)
Total Bilirubin: 0.9 mg/dL (ref 0.2–1.2)
Total Protein: 6.5 g/dL (ref 6.0–8.3)

## 2016-08-17 LAB — LIPID PANEL
CHOL/HDL RATIO: 2
Cholesterol: 160 mg/dL (ref 0–200)
HDL: 75.4 mg/dL (ref >39.00)
LDL Cholesterol: 70 mg/dL (ref 0–99)
NONHDL: 85.02
Triglycerides: 73 mg/dL (ref 0.0–149.0)
VLDL: 14.6 mg/dL (ref 0.0–40.0)

## 2016-08-17 MED ORDER — TADALAFIL 5 MG PO TABS: 5.0000 mg | ORAL_TABLET | Freq: Every day | ORAL | 4 refills | Status: DC

## 2016-08-17 NOTE — Assessment & Plan Note (Addendum)
--  Td 2016, pnm 23: 2016; prevnar: 2017; zostavax : at age 71; s/p shingrex #1 (08-2016 @ pharmacy) --CCS: cscope 2007, cscope 02-2016 , + polyps , next per GI --Prostate cancer  screening: Last year, PSA was increased, had antibiotics, saw urology, PSA decreased. Will recheck today --Lifestyle discussed, she is doing very well. --Labs:  FLP, CMP, CBC, PSA, hep C

## 2016-08-17 NOTE — Progress Notes (Signed)
Subjective:    Patient ID: Eric Vang, male    DOB: 09/17/1945, 71 y.o.   MRN: [id number]  DOS:  08/17/2016 Type of visit - description : cpx Interval history:No major concerns    Review of Systems Elevated PSA, saw urology, he also was diagnosed with bladder outlet obstruction, prostate was slightly enlarged. On Flomax. Has noted a gradual decrease in erections, more noticeable in the last 24 months. Libido, normal. Stamina and muscle mass not affected. Denies depression. Not taking any new medications.   Other than above, a 14 point review of systems is negative     Past Medical History:  Diagnosis Date  . Basal cell carcinoma of nose 2017   removed  . CLUSTER HEADACHE 01/15/2007   x 1 time  . DIVERTICULOSIS, COLON 02/04/2008  . Elevated PSA    plan possible prostate Bx w/ urology  . Hyperlipidemia    started statin 2015   . Inguinal hernia    right  . Insomnia   . Scrotal varices   . Spermatocele of epididymis, single   . Unilateral inguinal hernia 2017    Past Surgical History:  Procedure Laterality Date  . CARDIAC CATHETERIZATION     > 20 years ago  . COLONOSCOPY    . INGUINAL HERNIA REPAIR Right 12/03/2015   Procedure: LAPAROSCOPIC RIGHT INGUINAL HERNIA REPAIR;  Surgeon: [name], MD;  Location: [place];  Service: General;  Laterality: Right;  . INSERTION OF MESH Right 12/03/2015   Procedure: INSERTION OF MESH;  Surgeon: [name], MD;  Location: [place];  Service: General;  Laterality: Right;  . MOHS SURGERY  2017   L nose     Social History   Social History  . Marital status: Married    Spouse name: N/A  . Number of children: 1  . Years of education: N/A   Occupational History  . fully retired Scientist, water quality    Social History Main Topics  . Smoking status: Former Smoker    Types: Cigarettes  . Smokeless tobacco: Never Used     Comment: stopped in 1979  . Alcohol use 0.0 oz/week     Comment: beer or wine weekly per pt  . Drug use:  No  . Sexual activity: Not on file   Other Topics Concern  . Not on file   Social History Narrative   Moved from [place] . Step daughter is a Therapist, sports   Sister in law [name]    Exercise- walks qd      Family History  Problem Relation Age of Onset  . Diabetes Mother 17  . Atrial fibrillation Mother   . Cancer Father 60       bone   . Colon cancer Neg Hx   . Prostate cancer Neg Hx   . CAD Neg Hx      Allergies as of 08/17/2016   No Known Allergies     Medication List       Accurate as of 08/17/16  5:58 PM. Always use your most recent med list.          aspirin EC 81 MG tablet Take 81 mg by mouth daily.   simvastatin 10 MG tablet Commonly known as:  ZOCOR Take 1 tablet (10 mg total) by mouth daily.   tadalafil 5 MG tablet Commonly known as:  CIALIS Take 1 tablet (5 mg total) by mouth daily.   tamsulosin 0.4 MG Caps capsule Commonly known as:  FLOMAX Take 1 capsule (0.4 mg total) by mouth daily after supper.   valACYclovir 1000 MG tablet Commonly known as:  VALTREX Take 1 tablet (1,000 mg total) by mouth 2 (two) times daily as needed (cold sores). For 5 days per episode          Objective:   Physical Exam BP 108/68 (BP Location: Right Arm, Cuff Size: Normal)   Pulse 71   Temp 97.8 F (36.6 C) (Oral)   Resp 16   Ht 6\' 1"  (1.854 m)   Wt 194 lb 3.2 oz (88.1 kg)   SpO2 97%   BMI 25.62 kg/m   General:   Well developed, well nourished . NAD.  Neck: No  thyromegaly  HEENT:  Normocephalic . Face symmetric, atraumatic Lungs:  CTA B Normal respiratory effort, no intercostal retractions, no accessory muscle use. Heart: RRR,  no murmur.  No pretibial edema bilaterally  Abdomen:  Not distended, soft, non-tender. No rebound or rigidity.   Skin: Exposed areas without rash. Not pale. Not jaundice Neurologic:  alert & oriented X3.  Speech normal, gait appropriate for age and unassisted Strength symmetric and appropriate for age.   Psych: Cognition and judgment appear intact.  Cooperative with normal attention span and concentration.  Behavior appropriate. No anxious or depressed appearing.    Assessment & Plan:   Assessment Hyperlipidemia , rx statins 2015 Insomnia BCC, nose 2017 Cold sores, prn Valtrex H/o cluster HAs 1 -- 2008 Elevated PSA 08-2015, UCX (-), s/p abx, saw urology, DRE benign except for mild enlargement, PSA decreased, dx w/  Sx of bladder outlet obstruction  PLAN: Hyperlipidemia: On simvastatin, check labs. BPH (mild), bladder outlet obstructions: On Flomax, will add daily Cialis ED: With no sxs of low testosterone, we are adding Cialis, the most likely will help with erections as well. If for whatever reason unable to get Cialis, he will try sildenafil. RTC 3 months.

## 2016-08-17 NOTE — Patient Instructions (Signed)
GO TO THE LAB : Get the blood work     GO TO THE FRONT DESK Schedule your next appointment for a  routine checkup in 3 months    

## 2016-08-17 NOTE — Assessment & Plan Note (Signed)
Hyperlipidemia: On simvastatin, check labs. BPH (mild), bladder outlet obstructions: On Flomax, will add daily Cialis ED: With no sxs of low testosterone, we are adding Cialis, the most likely will help with erections as well. If for whatever reason unable to get Cialis, he will try sildenafil. RTC 3 months.

## 2016-08-18 LAB — HEPATITIS C ANTIBODY: HCV AB: NEGATIVE

## 2016-09-19 ENCOUNTER — Other Ambulatory Visit: Payer: Self-pay | Admitting: Internal Medicine

## 2016-09-24 ENCOUNTER — Other Ambulatory Visit: Payer: Self-pay | Admitting: Internal Medicine

## 2016-09-26 DIAGNOSIS — Z6825 Body mass index (BMI) 25.0-25.9, adult: Secondary | ICD-10-CM | POA: Diagnosis not present

## 2016-09-26 DIAGNOSIS — E785 Hyperlipidemia, unspecified: Secondary | ICD-10-CM | POA: Diagnosis not present

## 2016-09-26 DIAGNOSIS — Z0000 Encounter for general adult medical examination without abnormal findings: Secondary | ICD-10-CM | POA: Diagnosis not present

## 2016-09-26 DIAGNOSIS — Z7982 Long term (current) use of aspirin: Secondary | ICD-10-CM | POA: Diagnosis not present

## 2016-09-26 DIAGNOSIS — N400 Enlarged prostate without lower urinary tract symptoms: Secondary | ICD-10-CM | POA: Diagnosis not present

## 2016-09-26 DIAGNOSIS — H7290 Unspecified perforation of tympanic membrane, unspecified ear: Secondary | ICD-10-CM | POA: Diagnosis not present

## 2016-09-30 ENCOUNTER — Telehealth: Payer: Self-pay

## 2016-09-30 NOTE — Telephone Encounter (Signed)
Called pt to schedule awv. Pt stated that nurse from [facility] came to his home to complete awv. Pt stated that nurse will send a copy of paper work to the office.

## 2016-09-30 NOTE — Telephone Encounter (Signed)
Patient is on the list for [employer] 2018 and may be a good candidate for an AWV. Please let me know if/when appt is scheduled.   

## 2016-10-27 DIAGNOSIS — R69 Illness, unspecified: Secondary | ICD-10-CM | POA: Diagnosis not present

## 2016-11-28 DIAGNOSIS — R69 Illness, unspecified: Secondary | ICD-10-CM | POA: Diagnosis not present

## 2016-12-16 ENCOUNTER — Other Ambulatory Visit: Payer: Self-pay | Admitting: Internal Medicine

## 2016-12-22 ENCOUNTER — Encounter: Payer: Self-pay | Admitting: Internal Medicine

## 2017-01-08 DIAGNOSIS — R69 Illness, unspecified: Secondary | ICD-10-CM | POA: Diagnosis not present

## 2017-01-14 ENCOUNTER — Other Ambulatory Visit: Payer: Self-pay | Admitting: Internal Medicine

## 2017-01-17 ENCOUNTER — Ambulatory Visit (INDEPENDENT_AMBULATORY_CARE_PROVIDER_SITE_OTHER): Payer: Medicare HMO | Admitting: Internal Medicine

## 2017-01-17 ENCOUNTER — Encounter: Payer: Self-pay | Admitting: Internal Medicine

## 2017-01-17 VITALS — BP 118/78 | HR 92 | Temp 98.1°F | Resp 14 | Ht 73.0 in | Wt 188.0 lb

## 2017-01-17 DIAGNOSIS — N401 Enlarged prostate with lower urinary tract symptoms: Secondary | ICD-10-CM

## 2017-01-17 DIAGNOSIS — R351 Nocturia: Secondary | ICD-10-CM

## 2017-01-17 NOTE — Progress Notes (Signed)
Subjective:    Patient ID: Eric Vang, male    DOB: [birth year], 71 y.o.   MRN: [id number]  DOS:  01/17/2017 Type of visit - description : Follow-up Interval history: Since the last office visit, he tried to get Cialis but was $450 a month. He decided not to pursue it. Overall, BPH symptoms are slightly better, has bothersome  nocturia only 2 or 3 nights a week.   Review of Systems   Past Medical History:  Diagnosis Date  . Basal cell carcinoma of nose 2017   removed  . CLUSTER HEADACHE 01/15/2007   x 1 time  . DIVERTICULOSIS, COLON 02/04/2008  . Elevated PSA    plan possible prostate Bx w/ urology  . Hyperlipidemia    started statin 2015   . Inguinal hernia    right  . Insomnia   . Scrotal varices   . Spermatocele of epididymis, single   . Unilateral inguinal hernia 2017    Past Surgical History:  Procedure Laterality Date  . CARDIAC CATHETERIZATION     > 20 years ago  . COLONOSCOPY    . INGUINAL HERNIA REPAIR Right 12/03/2015   Procedure: LAPAROSCOPIC RIGHT INGUINAL HERNIA REPAIR;  Surgeon: [name], MD;  Location: [place];  Service: General;  Laterality: Right;  . INSERTION OF MESH Right 12/03/2015   Procedure: INSERTION OF MESH;  Surgeon: [name], MD;  Location: [place];  Service: General;  Laterality: Right;  . MOHS SURGERY  2017   L nose     Social History   Social History  . Marital status: Married    Spouse name: N/A  . Number of children: 1  . Years of education: N/A   Occupational History  . fully retired Scientist, water quality    Social History Main Topics  . Smoking status: Former Smoker    Types: Cigarettes  . Smokeless tobacco: Never Used     Comment: stopped in 1979  . Alcohol use 0.0 oz/week     Comment: beer or wine weekly per pt  . Drug use: No  . Sexual activity: Not on file   Other Topics Concern  . Not on file   Social History Narrative   Moved from [place] . Step daughter is a Therapist, sports   Sister in law [name]    Exercise- walks qd       Allergies as of 01/17/2017   No Known Allergies     Medication List       Accurate as of 01/17/17  1:26 PM. Always use your most recent med list.          aspirin EC 81 MG tablet Take 81 mg by mouth daily.   simvastatin 10 MG tablet Commonly known as:  ZOCOR Take 1 tablet (10 mg total) by mouth daily.   tadalafil 5 MG tablet Commonly known as:  CIALIS Take 1 tablet (5 mg total) by mouth daily.   tamsulosin 0.4 MG Caps capsule Commonly known as:  FLOMAX Take 1 capsule (0.4 mg total) by mouth daily.   valACYclovir 1000 MG tablet Commonly known as:  VALTREX Take 1 tablet (1,000 mg total) by mouth 2 (two) times daily as needed (cold sores). For 5 days per episode          Objective:   Physical Exam BP 118/78 (BP Location: Left Arm, Patient Position: Sitting, Cuff Size: Small)   Pulse 92   Temp 98.1 F (36.7 C) (Oral)   Resp  14   Ht 6\' 1"  (1.854 m)   Wt 188 lb (85.3 kg)   SpO2 97%   BMI 24.80 kg/m  General:   Well developed, well nourished . NAD.  HEENT:  Normocephalic . Face symmetric, atraumatic Skin: Not pale. Not jaundice Neurologic:  alert & oriented X3.  Speech normal, gait appropriate for age and unassisted Psych--  Cognition and judgment appear intact.  Cooperative with normal attention span and concentration.  Behavior appropriate. No anxious or depressed appearing.      Assessment & Plan:   Assessment Hyperlipidemia , rx statins 2015 Insomnia BCC, nose 2017 Cold sores, prn Valtrex H/o cluster HAs 1 -- 2008 Elevated PSA 08-2015, UCX (-), s/p abx, saw urology, DRE benign except for mild enlargement, PSA decreased, dx w/  Sx of bladder outlet obstruction  PLAN: BPH: See previous entry, currently on Flomax only, Cialis was too expensive, sxs are relatively okay and he does not like to pursue further treatment. We talk about avoidance of bladder irritants and avoid late fluid intake. ED: Not a major problem at this  time. RTC 08/2017, CPX

## 2017-01-17 NOTE — Patient Instructions (Addendum)
  GO TO THE FRONT DESK Schedule your next appointment for a physical exam by May 2019

## 2017-01-17 NOTE — Progress Notes (Signed)
Pre visit review using our clinic review tool, if applicable. No additional management support is needed unless otherwise documented below in the visit note. 

## 2017-01-17 NOTE — Assessment & Plan Note (Signed)
BPH: See previous entry, currently on Flomax only, Cialis was too expensive, sxs are relatively okay and he does not like to pursue further treatment. We talk about avoidance of bladder irritants and avoid late fluid intake. ED: Not a major problem at this time. RTC 08/2017, CPX

## 2017-02-12 ENCOUNTER — Other Ambulatory Visit: Payer: Self-pay | Admitting: Internal Medicine

## 2017-03-16 ENCOUNTER — Telehealth: Payer: Self-pay

## 2017-03-16 NOTE — Telephone Encounter (Signed)
SENT NOTES TO SCHEDULING 

## 2017-04-10 ENCOUNTER — Other Ambulatory Visit: Payer: Self-pay | Admitting: Internal Medicine

## 2017-05-02 DIAGNOSIS — L570 Actinic keratosis: Secondary | ICD-10-CM | POA: Diagnosis not present

## 2017-05-02 DIAGNOSIS — D225 Melanocytic nevi of trunk: Secondary | ICD-10-CM | POA: Diagnosis not present

## 2017-05-02 DIAGNOSIS — D1801 Hemangioma of skin and subcutaneous tissue: Secondary | ICD-10-CM | POA: Diagnosis not present

## 2017-05-02 DIAGNOSIS — L812 Freckles: Secondary | ICD-10-CM | POA: Diagnosis not present

## 2017-05-02 DIAGNOSIS — D2261 Melanocytic nevi of right upper limb, including shoulder: Secondary | ICD-10-CM | POA: Diagnosis not present

## 2017-05-02 DIAGNOSIS — L821 Other seborrheic keratosis: Secondary | ICD-10-CM | POA: Diagnosis not present

## 2017-05-02 DIAGNOSIS — Z85828 Personal history of other malignant neoplasm of skin: Secondary | ICD-10-CM | POA: Diagnosis not present

## 2017-05-02 DIAGNOSIS — L720 Epidermal cyst: Secondary | ICD-10-CM | POA: Diagnosis not present

## 2017-05-02 DIAGNOSIS — D2262 Melanocytic nevi of left upper limb, including shoulder: Secondary | ICD-10-CM | POA: Diagnosis not present

## 2017-06-14 ENCOUNTER — Other Ambulatory Visit: Payer: Self-pay | Admitting: Internal Medicine

## 2017-06-21 DIAGNOSIS — R69 Illness, unspecified: Secondary | ICD-10-CM | POA: Diagnosis not present

## 2017-07-03 DIAGNOSIS — Z833 Family history of diabetes mellitus: Secondary | ICD-10-CM | POA: Diagnosis not present

## 2017-07-03 DIAGNOSIS — Z85828 Personal history of other malignant neoplasm of skin: Secondary | ICD-10-CM | POA: Diagnosis not present

## 2017-07-03 DIAGNOSIS — R69 Illness, unspecified: Secondary | ICD-10-CM | POA: Diagnosis not present

## 2017-07-03 DIAGNOSIS — Z87891 Personal history of nicotine dependence: Secondary | ICD-10-CM | POA: Diagnosis not present

## 2017-07-03 DIAGNOSIS — E785 Hyperlipidemia, unspecified: Secondary | ICD-10-CM | POA: Diagnosis not present

## 2017-07-03 DIAGNOSIS — N400 Enlarged prostate without lower urinary tract symptoms: Secondary | ICD-10-CM | POA: Diagnosis not present

## 2017-07-03 DIAGNOSIS — N529 Male erectile dysfunction, unspecified: Secondary | ICD-10-CM | POA: Diagnosis not present

## 2017-07-03 DIAGNOSIS — Z809 Family history of malignant neoplasm, unspecified: Secondary | ICD-10-CM | POA: Diagnosis not present

## 2017-07-03 DIAGNOSIS — Z7982 Long term (current) use of aspirin: Secondary | ICD-10-CM | POA: Diagnosis not present

## 2017-08-04 DIAGNOSIS — L570 Actinic keratosis: Secondary | ICD-10-CM | POA: Diagnosis not present

## 2017-08-04 DIAGNOSIS — Z85828 Personal history of other malignant neoplasm of skin: Secondary | ICD-10-CM | POA: Diagnosis not present

## 2017-08-09 DIAGNOSIS — L237 Allergic contact dermatitis due to plants, except food: Secondary | ICD-10-CM | POA: Diagnosis not present

## 2017-08-15 DIAGNOSIS — Z0100 Encounter for examination of eyes and vision without abnormal findings: Secondary | ICD-10-CM | POA: Diagnosis not present

## 2017-08-24 ENCOUNTER — Encounter: Payer: Self-pay | Admitting: Internal Medicine

## 2017-08-24 ENCOUNTER — Ambulatory Visit (INDEPENDENT_AMBULATORY_CARE_PROVIDER_SITE_OTHER): Payer: Medicare HMO | Admitting: Internal Medicine

## 2017-08-24 VITALS — BP 118/75 | HR 77 | Temp 98.1°F | Resp 16 | Ht 72.5 in | Wt 195.6 lb

## 2017-08-24 DIAGNOSIS — Z0000 Encounter for general adult medical examination without abnormal findings: Secondary | ICD-10-CM

## 2017-08-24 LAB — URINALYSIS, ROUTINE W REFLEX MICROSCOPIC
Bilirubin Urine: NEGATIVE
KETONES UR: NEGATIVE
Leukocytes, UA: NEGATIVE
NITRITE: NEGATIVE
SPECIFIC GRAVITY, URINE: 1.025 (ref 1.000–1.030)
Total Protein, Urine: NEGATIVE
URINE GLUCOSE: NEGATIVE
UROBILINOGEN UA: 0.2 (ref 0.0–1.0)
WBC UA: NONE SEEN (ref 0–?)
pH: 6 (ref 5.0–8.0)

## 2017-08-24 LAB — COMPREHENSIVE METABOLIC PANEL
ALBUMIN: 4 g/dL (ref 3.5–5.2)
ALT: 24 U/L (ref 0–53)
AST: 21 U/L (ref 0–37)
Alkaline Phosphatase: 45 U/L (ref 39–117)
BUN: 20 mg/dL (ref 6–23)
CALCIUM: 9.1 mg/dL (ref 8.4–10.5)
CHLORIDE: 105 meq/L (ref 96–112)
CO2: 25 mEq/L (ref 19–32)
Creatinine, Ser: 0.85 mg/dL (ref 0.40–1.50)
GFR: 94.65 mL/min (ref >60.00)
Glucose, Bld: 84 mg/dL (ref 70–99)
POTASSIUM: 4 meq/L (ref 3.5–5.1)
Sodium: 139 mEq/L (ref 135–145)
Total Bilirubin: 1.1 mg/dL (ref 0.2–1.2)
Total Protein: 5.9 g/dL — ABNORMAL LOW (ref 6.0–8.3)

## 2017-08-24 LAB — LIPID PANEL
Cholesterol: 179 mg/dL (ref 0–200)
HDL: 77.6 mg/dL (ref >39.00)
LDL Cholesterol: 84 mg/dL (ref 0–99)
NONHDL: 101.38
Total CHOL/HDL Ratio: 2
Triglycerides: 85 mg/dL (ref 0.0–149.0)
VLDL: 17 mg/dL (ref 0.0–40.0)

## 2017-08-24 LAB — CBC
HCT: 44.1 % (ref 39.0–52.0)
Hemoglobin: 15.3 g/dL (ref 13.0–17.0)
MCHC: 34.7 g/dL (ref 30.0–36.0)
MCV: 98.2 fl (ref 78.0–100.0)
Platelets: 254 10*3/uL (ref 150.0–400.0)
RBC: 4.49 Mil/uL (ref 4.22–5.81)
RDW: 13.6 % (ref 11.5–15.5)
WBC: 5.3 10*3/uL (ref 4.0–10.5)

## 2017-08-24 LAB — PSA: PSA: 4.1 ng/mL — ABNORMAL HIGH (ref 0.10–4.00)

## 2017-08-24 NOTE — Patient Instructions (Signed)
GO TO THE LAB : Get the blood work     GO TO THE FRONT DESK Schedule your next appointment for a physical exam in 1 year  See your dermatologist regularly

## 2017-08-24 NOTE — Assessment & Plan Note (Signed)
Hyperlipidemia: On simvastatin, checking labs BCC: Recommend to see dermatology regularly BPH and history of bladder outlet obstruction : Still symptomatic on and off, we had a long conversation about it, we discussed possibly urology referral, he declined it for now.  DRE showed mild to moderate enlargement, will check a PSA RTC 1 year

## 2017-08-24 NOTE — Assessment & Plan Note (Signed)
--  Td 2016, pnm 23: 2016; prevnar: 2017; zostavax : at age 72; s/p shingrex #1 and #2 --CCS: cscope 2007, cscope 02-2016 , + polyps , next per GI --Prostate cancer  Screening: DRE done today we will check a PSA.  See comments under BPH --Lifestyle discussed, he is doing very well. --Labs:   BMP, FLP, CBC, PSA, UA --Still on aspirin

## 2017-08-24 NOTE — Progress Notes (Signed)
Subjective:    Patient ID: Eric Vang, male    DOB: 1946/01/14, 72 y.o.   MRN: [id number]  DOS:  08/24/2017 Type of visit - description : cpx Interval history: doing well   Review of Systems On Flomax, still has nocturia, sx varies from no problems some nights to wake up and going to the bathroom 3 or 4 times. Denies dysuria or gross hematuria  Other than above, a 14 point review of systems is negative    Past Medical History:  Diagnosis Date  . Basal cell carcinoma of nose 2017   removed  . CLUSTER HEADACHE 01/15/2007   x 1 time  . DIVERTICULOSIS, COLON 02/04/2008  . Elevated PSA    plan possible prostate Bx w/ urology  . Hyperlipidemia    started statin 2015   . Inguinal hernia    right  . Insomnia   . Scrotal varices   . Spermatocele of epididymis, single   . Unilateral inguinal hernia 2017    Past Surgical History:  Procedure Laterality Date  . CARDIAC CATHETERIZATION     > 20 years ago  . COLONOSCOPY    . INGUINAL HERNIA REPAIR Right 12/03/2015   Procedure: LAPAROSCOPIC RIGHT INGUINAL HERNIA REPAIR;  Surgeon: [name], MD;  Location: [place];  Service: General;  Laterality: Right;  . INSERTION OF MESH Right 12/03/2015   Procedure: INSERTION OF MESH;  Surgeon: [name], MD;  Location: [place];  Service: General;  Laterality: Right;  . MOHS SURGERY  2017   L nose     Social History   Socioeconomic History  . Marital status: Married    Spouse name: Not on file  . Number of children: 1  . Years of education: Not on file  . Highest education level: Not on file  Occupational History  . Occupation: fully retired Scientist, water quality  Social Needs  . Financial resource strain: Not on file  . Food insecurity:    Worry: Not on file    Inability: Not on file  . Transportation needs:    Medical: Not on file    Non-medical: Not on file  Tobacco Use  . Smoking status: Former Smoker    Types: Cigarettes  . Smokeless tobacco: Never Used  . Tobacco comment:  stopped in 1979  Substance and Sexual Activity  . Alcohol use: Yes    Alcohol/week: 0.0 oz    Comment: beer or wine weekly per pt  . Drug use: No  . Sexual activity: Not on file  Lifestyle  . Physical activity:    Days per week: Not on file    Minutes per session: Not on file  . Stress: Not on file  Relationships  . Social connections:    Talks on phone: Not on file    Gets together: Not on file    Attends religious service: Not on file    Active member of club or organization: Not on file    Attends meetings of clubs or organizations: Not on file    Relationship status: Not on file  . Intimate partner violence:    Fear of current or ex partner: Not on file    Emotionally abused: Not on file    Physically abused: Not on file    Forced sexual activity: Not on file  Other Topics Concern  . Not on file  Social History Narrative   Moved from [place] .    Step daughter is a Therapist, sports  Sister in law [employer]    Exercise- walks qd      Family History  Problem Relation Age of Onset  . Diabetes Mother 14  . Atrial fibrillation Mother   . Cancer Father 47       bone   . Colon cancer Neg Hx   . Prostate cancer Neg Hx   . CAD Neg Hx      Allergies as of 08/24/2017   No Known Allergies     Medication List        Accurate as of 08/24/17  8:04 PM. Always use your most recent med list.          aspirin EC 81 MG tablet Take 81 mg by mouth daily.   simvastatin 10 MG tablet Commonly known as:  ZOCOR Take 1 tablet (10 mg total) by mouth daily.   tamsulosin 0.4 MG Caps capsule Commonly known as:  FLOMAX Take 1 capsule (0.4 mg total) by mouth daily.   valACYclovir 1000 MG tablet Commonly known as:  VALTREX TAKE 1 TABLET BY MOUTH TWO (2) TIMES DAILY AS NEEDED FOR COLD SORES. FOR 5 DAYS PER EPISODE          Objective:   Physical Exam BP 118/75 (BP Location: Right Arm, Patient Position: Sitting, Cuff Size: Large)   Pulse 77   Temp 98.1 F (36.7 C) (Oral)    Resp 16   Ht 6' 0.5" (1.842 m)   Wt 195 lb 9.6 oz (88.7 kg)   SpO2 97%   BMI 26.16 kg/m  General:   Well developed, well nourished . NAD.  Neck: No  thyromegaly  HEENT:  Normocephalic . Face symmetric, atraumatic Lungs:  CTA B Normal respiratory effort, no intercostal retractions, no accessory muscle use. Heart: RRR,  no murmur.  No pretibial edema bilaterally  Abdomen:  Not distended, soft, non-tender. No rebound or rigidity.   Skin: Exposed areas without rash. Not pale. Not jaundice Rectal: External abnormalities: none. Normal sphincter tone. No rectal masses or tenderness.  Brown stools Prostate: Prostate gland firm and smooth, mild/moderate enlargement.  No nodularity, tenderness, mass, asymmetry or induration Neurologic:  alert & oriented X3.  Speech normal, gait appropriate for age and unassisted Strength symmetric and appropriate for age.  Psych: Cognition and judgment appear intact.  Cooperative with normal attention span and concentration.  Behavior appropriate. No anxious or depressed appearing.     Assessment & Plan:     Assessment Hyperlipidemia , rx statins 2015 Insomnia BCC, nose 2017 Cold sores, prn Valtrex H/o cluster HAs 1 -- 2008 Elevated PSA 08-2015, UCX (-), s/p abx, saw urology, DRE benign except for mild enlargement, PSA decreased, dx w/  Sx of bladder outlet obstruction  PLAN: Hyperlipidemia: On simvastatin, checking labs BCC: Recommend to see dermatology regularly BPH and history of bladder outlet obstruction : Still symptomatic on and off, we had a long conversation about it, we discussed possibly urology referral, he declined it for now.  DRE showed mild to moderate enlargement, will check a PSA RTC 1 year

## 2017-09-07 DIAGNOSIS — H01002 Unspecified blepharitis right lower eyelid: Secondary | ICD-10-CM | POA: Diagnosis not present

## 2017-09-07 DIAGNOSIS — H02843 Edema of right eye, unspecified eyelid: Secondary | ICD-10-CM | POA: Diagnosis not present

## 2017-09-07 DIAGNOSIS — H01001 Unspecified blepharitis right upper eyelid: Secondary | ICD-10-CM | POA: Diagnosis not present

## 2017-09-08 ENCOUNTER — Telehealth: Payer: Self-pay | Admitting: Internal Medicine

## 2017-09-08 ENCOUNTER — Encounter: Payer: Self-pay | Admitting: Medical

## 2017-09-08 ENCOUNTER — Ambulatory Visit (INDEPENDENT_AMBULATORY_CARE_PROVIDER_SITE_OTHER): Payer: Medicare HMO | Admitting: Medical

## 2017-09-08 VITALS — BP 132/83 | HR 87 | Temp 98.5°F | Resp 16 | Ht 72.0 in | Wt 195.4 lb

## 2017-09-08 DIAGNOSIS — L089 Local infection of the skin and subcutaneous tissue, unspecified: Secondary | ICD-10-CM | POA: Diagnosis not present

## 2017-09-08 DIAGNOSIS — T7840XA Allergy, unspecified, initial encounter: Secondary | ICD-10-CM | POA: Diagnosis not present

## 2017-09-08 MED ORDER — DOXYCYCLINE HYCLATE 100 MG PO TABS: 100.0000 mg | ORAL_TABLET | Freq: Two times a day (BID) | ORAL | 0 refills | Status: DC

## 2017-09-08 MED ORDER — PREDNISONE 10 MG PO TABS: ORAL_TABLET | ORAL | 0 refills | Status: DC

## 2017-09-08 NOTE — Telephone Encounter (Signed)
Copied from [place] [id number]. Topic: Quick Communication - See Telephone Encounter >> Sep 08, 2017 12:25 PM Valla Leaver wrote: CRM for notification. See Telephone encounter for: 09/08/17. Patient needs to know how to take doxycycline (VIBRA-TABS) 100 MG tablet. He was told to take with food but script instructions say to take it without food.

## 2017-09-08 NOTE — Progress Notes (Signed)
Subjective:    Patient ID: Eric Vang, male    DOB: Mar 30, 1946, 72 y.o.   MRN: [id number]  HPI  Pt woke up yesterday morning has his rt eye was puffy with some tightness. [facility] got little concerned for possible early periorbital cellulitis(they advised uc or ED). He tried to go to uc but they were closed. No itching to the area.   Faint raspy throat with some  Pnd. Also mild occasional cough.(these symptoms since Tuesday)  No discharge to his eye. He thinks rt lid may feel mild sticky in the morning.   Review of Systems  Constitutional: Negative for chills, fatigue and fever.  Respiratory: Negative for cough, chest tightness and shortness of breath.   Cardiovascular: Negative for chest pain and palpitations.  Gastrointestinal: Negative for abdominal pain.  Musculoskeletal: Negative for back pain.  Skin: Negative for rash.       See hpi.  Neurological: Negative for dizziness and headaches.  Hematological: Negative for adenopathy. Does not bruise/bleed easily.  Psychiatric/Behavioral: Negative for behavioral problems and confusion.     Past Medical History:  Diagnosis Date  . Basal cell carcinoma of nose 2017   removed  . CLUSTER HEADACHE 01/15/2007   x 1 time  . DIVERTICULOSIS, COLON 02/04/2008  . Elevated PSA    plan possible prostate Bx w/ urology  . Hyperlipidemia    started statin 2015   . Inguinal hernia    right  . Insomnia   . Scrotal varices   . Spermatocele of epididymis, single   . Unilateral inguinal hernia 2017     Social History   Socioeconomic History  . Marital status: Married    Spouse name: Not on file  . Number of children: 1  . Years of education: Not on file  . Highest education level: Not on file  Occupational History  . Occupation: fully retired Scientist, water quality  Social Needs  . Financial resource strain: Not on file  . Food insecurity:    Worry: Not on file    Inability: Not on file  . Transportation  needs:    Medical: Not on file    Non-medical: Not on file  Tobacco Use  . Smoking status: Former Smoker    Types: Cigarettes  . Smokeless tobacco: Never Used  . Tobacco comment: stopped in 1979  Substance and Sexual Activity  . Alcohol use: Yes    Alcohol/week: 0.0 oz    Comment: beer or wine weekly per pt  . Drug use: No  . Sexual activity: Not on file  Lifestyle  . Physical activity:    Days per week: Not on file    Minutes per session: Not on file  . Stress: Not on file  Relationships  . Social connections:    Talks on phone: Not on file    Gets together: Not on file    Attends religious service: Not on file    Active member of club or organization: Not on file    Attends meetings of clubs or organizations: Not on file    Relationship status: Not on file  . Intimate partner violence:    Fear of current or ex partner: Not on file    Emotionally abused: Not on file    Physically abused: Not on file    Forced sexual activity: Not on file  Other Topics Concern  . Not on file  Social History Narrative   Moved from [place] .  Step daughter is a Therapist, sports   Sister in law [name]    Exercise- walks qd     Past Surgical History:  Procedure Laterality Date  . CARDIAC CATHETERIZATION     > 20 years ago  . COLONOSCOPY    . INGUINAL HERNIA REPAIR Right 12/03/2015   Procedure: LAPAROSCOPIC RIGHT INGUINAL HERNIA REPAIR;  Surgeon: [name], MD;  Location: [place];  Service: General;  Laterality: Right;  . INSERTION OF MESH Right 12/03/2015   Procedure: INSERTION OF MESH;  Surgeon: [name], MD;  Location: [place];  Service: General;  Laterality: Right;  . MOHS SURGERY  2017   L nose     Family History  Problem Relation Age of Onset  . Diabetes Mother 62  . Atrial fibrillation Mother   . Cancer Father 60       bone   . Colon cancer Neg Hx   . Prostate cancer Neg Hx   . CAD Neg Hx     No Known Allergies  Current Outpatient Medications on File Prior to  Visit  Medication Sig Dispense Refill  . aspirin EC 81 MG tablet Take 81 mg by mouth daily.    . simvastatin (ZOCOR) 10 MG tablet Take 1 tablet (10 mg total) by mouth daily. 90 tablet 0  . tamsulosin (FLOMAX) 0.4 MG CAPS capsule Take 1 capsule (0.4 mg total) by mouth daily. 90 capsule 0  . valACYclovir (VALTREX) 1000 MG tablet TAKE 1 TABLET BY MOUTH TWO (2) TIMES DAILY AS NEEDED FOR COLD SORES. FOR 5 DAYS PER EPISODE 30 tablet 3   Current Facility-Administered Medications on File Prior to Visit  Medication Dose Route Frequency Provider Last Rate Last Dose  . 0.9 %  sodium chloride infusion  500 mL Intravenous Continuous Nandigam, Kavitha V, MD        BP 132/83   Pulse 87   Temp 98.5 F (36.9 C) (Oral)   Resp 16   Ht 6' (1.829 m)   Wt 195 lb 6.4 oz (88.6 kg)   SpO2 99%   BMI 26.50 kg/m        Objective:   Physical Exam   General  Mental Status - Alert. General Appearance - Well groomed. Not in acute distress.  Skin  See eye rt lower lid exam.  HEENT Head- Normal. Ear Auditory Canal - Left- Normal. Right - Normal.Tympanic Membrane- Left- Normal. Right- Normal. Eye Sclera/Conjunctiva- Left- Normal. Right- Normal.(Right lower lid has moderate pinkish redness with faint swelling.  On close inspection I do not see any vesicles or papules.) Nose & Sinuses Nasal Mucosa- Left-  Boggy and Congested. Right-  Boggy and  Congested.Bilateral  No maxillary and no  frontal sinus pressure. Mouth & Throat Lips: Upper Lip- Normal: no dryness, cracking, pallor, cyanosis, or vesicular eruption. Lower Lip-Normal: no dryness, cracking, pallor, cyanosis or vesicular eruption. Buccal Mucosa- Bilateral- No Aphthous ulcers. Oropharynx- No Discharge or Erythema. +pnd Tonsils: Characteristics- Bilateral- No Erythema or Congestion. Size/Enlargement- Bilateral- No enlargement. Discharge- bilateral-None.  Neck Neck- Supple. No Masses.   Chest and Lung Exam Auscultation: Breath Sounds:-Clear  even and unlabored.  Cardiovascular Auscultation:Rythm- Regular, rate and rhythm. Murmurs & Other Heart Sounds:Ausculatation of the heart reveal- No Murmurs.  Lymphatic Head & Neck General Head & Neck Lymphatics: Bilateral: Description- No Localized lymphadenopathy.      Assessment & Plan:  You may have very early skin infection to the right lower eyelid region.  Would not classified as cellulitis presently.  I am prescribing doxycycline.  Rx advisement given.  Also possible allergic reaction component that may have preceded early skin infection.  I am prescribing 4-day taper dose of prednisone.  I expect the area to gradually improve.  If any worsening or changing symptoms please let us know.  Please schedule follow-up on Tuesday.  If area is significantly improved by Monday then can call on Monday and cancel follow-up on Tuesday.  Mackie Pai, PA-C

## 2017-09-08 NOTE — Patient Instructions (Signed)
You may have very early skin infection to the right lower eyelid region.  Would not classified as cellulitis presently.  I am prescribing doxycycline.  Rx advisement given.  Also possible allergic reaction component that may have preceded early skin infection.  I am prescribing 4-day taper dose of prednisone.  I expect the area to gradually improve.  If any worsening or changing symptoms please let us know.  Please schedule follow-up on Tuesday.  If area is significantly improved by Monday then can call on Monday and cancel follow-up on Tuesday.

## 2017-09-08 NOTE — Telephone Encounter (Signed)
Author phoned pt to relay that pt. can take with food so as to avoid N/V, but if he takes it one hour prior to eating as instructed on label, that is OK too. Pt. Appreciative of call.

## 2017-09-12 ENCOUNTER — Ambulatory Visit: Payer: Medicare HMO | Admitting: Medical

## 2017-09-12 ENCOUNTER — Other Ambulatory Visit: Payer: Self-pay | Admitting: Internal Medicine

## 2017-09-19 DIAGNOSIS — L237 Allergic contact dermatitis due to plants, except food: Secondary | ICD-10-CM | POA: Diagnosis not present

## 2017-09-19 DIAGNOSIS — Z85828 Personal history of other malignant neoplasm of skin: Secondary | ICD-10-CM | POA: Diagnosis not present

## 2018-01-22 DIAGNOSIS — R69 Illness, unspecified: Secondary | ICD-10-CM | POA: Diagnosis not present

## 2018-02-15 ENCOUNTER — Ambulatory Visit (INDEPENDENT_AMBULATORY_CARE_PROVIDER_SITE_OTHER): Payer: Medicare HMO | Admitting: Internal Medicine

## 2018-02-15 ENCOUNTER — Encounter: Payer: Self-pay | Admitting: Internal Medicine

## 2018-02-15 VITALS — BP 122/76 | HR 96 | Temp 97.6°F | Resp 16 | Ht 72.0 in | Wt 196.2 lb

## 2018-02-15 DIAGNOSIS — G44019 Episodic cluster headache, not intractable: Secondary | ICD-10-CM

## 2018-02-15 MED ORDER — KETOROLAC TROMETHAMINE 10 MG PO TABS: 10.0000 mg | ORAL_TABLET | Freq: Four times a day (QID) | ORAL | 0 refills | Status: DC | PRN

## 2018-02-15 NOTE — Progress Notes (Signed)
Pre visit review using our clinic review tool, if applicable. No additional management support is needed unless otherwise documented below in the visit note. 

## 2018-02-15 NOTE — Progress Notes (Signed)
Subjective:    Patient ID: Eric Vang, male    DOB: 10-30-1945, 72 y.o.   MRN: [id number]  DOS:  02/15/2018 Type of visit - description : acute Interval history: His main concern is headaches: Had a episode approximately 6 months ago, 4 months ago, 2 months ago and  last week. Last week he actually had 3 episodes the last one was 2 days ago. Typically he has a very intense, sharp headache located behind the left eye. It may last 24 to 48 hours and although he is somewhat better with "allergy medications" the patient has the feeling that the pain simply go away on its own. He has tried to use cold or heating pad with no help. I asked him to describe the intensity but he could not, he simply said it was very intense.   Review of Systems Denies fever or chills No rash No major problems with sinus congestion on discharge No nausea or vomiting No associated visual disturbances. No weight loss. Some tearing from the left eye?Marland Kitchen Denies dental pain, no scalp paresthesias, no chew claudication.   Past Medical History:  Diagnosis Date  . Basal cell carcinoma of nose 2017   removed  . CLUSTER HEADACHE 01/15/2007   x 1 time  . DIVERTICULOSIS, COLON 02/04/2008  . Elevated PSA    plan possible prostate Bx w/ urology  . Hyperlipidemia    started statin 2015   . Inguinal hernia    right  . Insomnia   . Scrotal varices   . Spermatocele of epididymis, single   . Unilateral inguinal hernia 2017    Past Surgical History:  Procedure Laterality Date  . CARDIAC CATHETERIZATION     > 20 years ago  . INGUINAL HERNIA REPAIR Right 12/03/2015   Procedure: LAPAROSCOPIC RIGHT INGUINAL HERNIA REPAIR;  Surgeon: [name], MD;  Location: [place];  Service: General;  Laterality: Right;  . INSERTION OF MESH Right 12/03/2015   Procedure: INSERTION OF MESH;  Surgeon: [name], MD;  Location: [place];  Service: General;  Laterality: Right;  . MOHS SURGERY  2017   L nose     Social  History   Socioeconomic History  . Marital status: Married    Spouse name: Not on file  . Number of children: 1  . Years of education: Not on file  . Highest education level: Not on file  Occupational History  . Occupation: fully retired Scientist, water quality  Social Needs  . Financial resource strain: Not on file  . Food insecurity:    Worry: Not on file    Inability: Not on file  . Transportation needs:    Medical: Not on file    Non-medical: Not on file  Tobacco Use  . Smoking status: Former Smoker    Types: Cigarettes  . Smokeless tobacco: Never Used  . Tobacco comment: stopped in 1979  Substance and Sexual Activity  . Alcohol use: Yes    Alcohol/week: 0.0 standard drinks    Comment: beer or wine weekly per pt  . Drug use: No  . Sexual activity: Not on file  Lifestyle  . Physical activity:    Days per week: Not on file    Minutes per session: Not on file  . Stress: Not on file  Relationships  . Social connections:    Talks on phone: Not on file    Gets together: Not on file    Attends religious service: Not on file  Active member of club or organization: Not on file    Attends meetings of clubs or organizations: Not on file    Relationship status: Not on file  . Intimate partner violence:    Fear of current or ex partner: Not on file    Emotionally abused: Not on file    Physically abused: Not on file    Forced sexual activity: Not on file  Other Topics Concern  . Not on file  Social History Narrative   Moved from [place] .    Step daughter is a Therapist, sports   Sister in law [name]    Exercise- walks qd       Allergies as of 02/15/2018   No Known Allergies     Medication List        Accurate as of 02/15/18 11:59 PM. Always use your most recent med list.          aspirin EC 81 MG tablet Take 81 mg by mouth daily.   ketorolac 10 MG tablet Commonly known as:  TORADOL Take 1 tablet (10 mg total) by mouth every 6 (six) hours as needed for moderate  pain.   simvastatin 10 MG tablet Commonly known as:  ZOCOR Take 1 tablet (10 mg total) by mouth daily.   tamsulosin 0.4 MG Caps capsule Commonly known as:  FLOMAX Take 1 capsule (0.4 mg total) by mouth daily.   valACYclovir 1000 MG tablet Commonly known as:  VALTREX TAKE 1 TABLET BY MOUTH TWO (2) TIMES DAILY AS NEEDED FOR COLD SORES. FOR 5 DAYS PER EPISODE          Objective:   Physical Exam BP 122/76 (BP Location: Left Arm, Patient Position: Sitting, Cuff Size: Small)   Pulse 96   Temp 97.6 F (36.4 C) (Oral)   Resp 16   Ht 6' (1.829 m)   Wt 196 lb 4 oz (89 kg)   SpO2 97%   BMI 26.62 kg/m  General:   Well developed, NAD, BMI noted. HEENT:  Normocephalic . Face symmetric, atraumatic. Nose not congested, sinuses no TTP. Temporal: Normal T.  A.    on palpation No TMJ TTP Neck: Normal carotid pulses. Lungs:  CTA B Normal respiratory effort, no intercostal retractions, no accessory muscle use. Heart: RRR,  no murmur.  No pretibial edema bilaterally  Skin: Not pale. Not jaundice Neurologic:  alert & oriented X3.  Speech normal, gait appropriate for age and unassisted EOMI, pupils are small but reactive and symmetric.  DTR symmetric Psych--  Cognition and judgment appear intact.  Cooperative with normal attention span and concentration.  Behavior appropriate. No anxious or depressed appearing.      Assessment & Plan:     Assessment Hyperlipidemia , rx statins 2015 Insomnia BCC, nose 2017 Cold sores, prn Valtrex H/o cluster HAs 1 -- 2006 Elevated PSA 08-2015, UCX (-), s/p abx, saw urology, DRE benign except for mild enlargement, PSA decreased, dx w/  Sx of bladder outlet obstruction  PLAN: Headache: As described above, on chart review, she had similar episode (but right-sided)  back in 2006, at the time he had a CT and saw neurology, was felt to have cluster headaches. On clinical grounds, the headaches appeared to be cluster but DDX includes IC process,  occult sinusitis, temporal artery arteritis, neuralgia, versus others. Since this is a "new" onset after almost 13 years I recommend imaging, patient has severe claustrophobia and would be very hesitant to pursue as CT or MRI  so we agreed on: CBC, BMP, sed rate. Neuro  referral Trial with Toradol.  Addendum: Pharmacy reports a toradol of black box warning, will switch to meloxicam 7.5 mg 1 or 2 a day as needed for headache #30 no refills ER or call if he has any unusual symptoms, see AVS

## 2018-02-15 NOTE — Patient Instructions (Addendum)
GO TO THE LAB : Get the blood work    If you develop another headache: Rest, drink plenty fluids, try ketorolac every 6 hours.  If you have a different or quite intense headache: Go to the ER  We are referring you to the neurologist  Call if you have any fever, chills, rash, visual disturbances, neck pain etc.

## 2018-02-16 LAB — CBC WITH DIFFERENTIAL/PLATELET
BASOS ABS: 0 10*3/uL (ref 0.0–0.1)
Basophils Relative: 0.6 % (ref 0.0–3.0)
EOS ABS: 0.3 10*3/uL (ref 0.0–0.7)
Eosinophils Relative: 3.3 % (ref 0.0–5.0)
HCT: 45.3 % (ref 39.0–52.0)
Hemoglobin: 15.8 g/dL (ref 13.0–17.0)
LYMPHS PCT: 17.7 % (ref 12.0–46.0)
Lymphs Abs: 1.4 10*3/uL (ref 0.7–4.0)
MCHC: 34.8 g/dL (ref 30.0–36.0)
MCV: 97.7 fl (ref 78.0–100.0)
MONO ABS: 0.6 10*3/uL (ref 0.1–1.0)
Monocytes Relative: 7.7 % (ref 3.0–12.0)
NEUTROS ABS: 5.4 10*3/uL (ref 1.4–7.7)
NEUTROS PCT: 70.7 % (ref 43.0–77.0)
PLATELETS: 285 10*3/uL (ref 150.0–400.0)
RBC: 4.64 Mil/uL (ref 4.22–5.81)
RDW: 13.1 % (ref 11.5–15.5)
WBC: 7.7 10*3/uL (ref 4.0–10.5)

## 2018-02-16 LAB — BASIC METABOLIC PANEL
BUN: 28 mg/dL — AB (ref 6–23)
CALCIUM: 9.7 mg/dL (ref 8.4–10.5)
CO2: 26 meq/L (ref 19–32)
CREATININE: 0.91 mg/dL (ref 0.40–1.50)
Chloride: 105 mEq/L (ref 96–112)
GFR: 87.36 mL/min (ref >60.00)
Glucose, Bld: 72 mg/dL (ref 70–99)
Potassium: 4.1 mEq/L (ref 3.5–5.1)
Sodium: 140 mEq/L (ref 135–145)

## 2018-02-16 LAB — SEDIMENTATION RATE: Sed Rate: 3 mm/hr (ref 0–20)

## 2018-02-18 NOTE — Assessment & Plan Note (Signed)
Headache: As described above, on chart review, she had similar episode (but right-sided)  back in 2006, at the time he had a CT and saw neurology, was felt to have cluster headaches. On clinical grounds, the headaches appeared to be cluster but DDX includes IC process, occult sinusitis, temporal artery arteritis, neuralgia, versus others. Since this is a "new" onset after almost 13 years I recommend imaging, patient has severe claustrophobia and would be very hesitant to pursue as CT or MRI so we agreed on: CBC, BMP, sed rate. Neuro  referral Trial with Toradol.  Addendum: Pharmacy reports a toradol of black box warning, will switch to meloxicam 7.5 mg 1 or 2 a day as needed for headache #30 no refills ER or call if he has any unusual symptoms, see AVS

## 2018-02-21 ENCOUNTER — Ambulatory Visit: Payer: Medicare HMO | Admitting: Diagnostic Neuroimaging

## 2018-02-21 ENCOUNTER — Encounter: Payer: Self-pay | Admitting: Diagnostic Neuroimaging

## 2018-02-21 VITALS — BP 118/72 | HR 72 | Ht 72.0 in | Wt 198.4 lb

## 2018-02-21 DIAGNOSIS — G44019 Episodic cluster headache, not intractable: Secondary | ICD-10-CM

## 2018-02-21 NOTE — Progress Notes (Signed)
[facility]  PATIENT: Eric Vang DOB: 14-Dec-1945  REFERRING CLINICIAN: Paz HISTORY FROM: patient  REASON FOR VISIT: new consult    HISTORICAL  CHIEF COMPLAINT:  Chief Complaint  Patient presents with  . New Patient (Initial Visit)    Rm 7, alone  . Referred by Dr. Kathlene November    Episodic cluster headaches ( initial spring 2006 R eye, then spring 2019 L eye , 1 month later- L eye, 2 wks ago L eye, 2-3 days later L eye, 2-3 days later L eye, then 8 days ago gone.       HISTORY OF PRESENT ILLNESS:   72 year old male here for evaluation of headaches.    2006 patient had some type of headache with pressure over his right eye.  He had another headache in 2008.  Headache lasted for several hours at a time.  No vision changes.  In 2019 spring patient had onset of left eye pain 3 times, associated with some tearing, severe pain.  He tried Advil, over-the-counter allergy medicine without relief.  His last event occurred 2 weeks ago and lasted for several hours.  No vision changes.  No slurred speech, numbness or trouble talking.  No weakness.  He has been on a ketogenic diet since early 2019.    REVIEW OF SYSTEMS: Full 14 system review of systems performed and negative with exception of: Negative.  ALLERGIES: No Known Allergies  HOME MEDICATIONS: Outpatient Medications Prior to Visit  Medication Sig Dispense Refill  . aspirin EC 81 MG tablet Take 81 mg by mouth daily.    . simvastatin (ZOCOR) 10 MG tablet Take 1 tablet (10 mg total) by mouth daily. 90 tablet 2  . tamsulosin (FLOMAX) 0.4 MG CAPS capsule Take 1 capsule (0.4 mg total) by mouth daily. 90 capsule 2  . meloxicam (MOBIC) 7.5 MG tablet Take 7.5-15 mg by mouth daily as needed for pain.    . valACYclovir (VALTREX) 1000 MG tablet TAKE 1 TABLET BY MOUTH TWO (2) TIMES DAILY AS NEEDED FOR COLD SORES. FOR 5 DAYS PER EPISODE (Patient not taking: Reported on 02/21/2018) 30 tablet 3   No  facility-administered medications prior to visit.     PAST MEDICAL HISTORY: Past Medical History:  Diagnosis Date  . Basal cell carcinoma of nose 2017   removed  . CLUSTER HEADACHE 01/15/2007   x 1 time  . DIVERTICULOSIS, COLON 02/04/2008  . Elevated PSA    plan possible prostate Bx w/ urology  . Hyperlipidemia    started statin 2015   . Inguinal hernia    right  . Insomnia   . Scrotal varices   . Spermatocele of epididymis, single   . Unilateral inguinal hernia 2017    PAST SURGICAL HISTORY: Past Surgical History:  Procedure Laterality Date  . CARDIAC CATHETERIZATION     > 20 years ago  . INGUINAL HERNIA REPAIR Right 12/03/2015   Procedure: LAPAROSCOPIC RIGHT INGUINAL HERNIA REPAIR;  Surgeon: [name], MD;  Location: [place];  Service: General;  Laterality: Right;  . INSERTION OF MESH Right 12/03/2015   Procedure: INSERTION OF MESH;  Surgeon: [name], MD;  Location: [place];  Service: General;  Laterality: Right;  . MOHS SURGERY  2017   L nose     FAMILY HISTORY: Family History  Problem Relation Age of Onset  . Diabetes Mother 65       natural causes.   . Atrial fibrillation Mother   . Uterine cancer  Mother   . Cancer Father 40       bone   . Cancer Paternal Grandmother        sinus cancer  . Colon cancer Neg Hx   . Prostate cancer Neg Hx   . CAD Neg Hx     SOCIAL HISTORY: Social History   Socioeconomic History  . Marital status: Married    Spouse name: Not on file  . Number of children: 1  . Years of education: Not on file  . Highest education level: Not on file  Occupational History  . Occupation: fully retired Scientist, water quality  Social Needs  . Financial resource strain: Not on file  . Food insecurity:    Worry: Not on file    Inability: Not on file  . Transportation needs:    Medical: Not on file    Non-medical: Not on file  Tobacco Use  . Smoking status: Former Smoker    Types: Cigarettes  . Smokeless tobacco: Never Used  . Tobacco comment:  stopped in 1979  Substance and Sexual Activity  . Alcohol use: Yes    Alcohol/week: 0.0 standard drinks    Comment: beer or wine weekly per pt  . Drug use: No  . Sexual activity: Not on file  Lifestyle  . Physical activity:    Days per week: Not on file    Minutes per session: Not on file  . Stress: Not on file  Relationships  . Social connections:    Talks on phone: Not on file    Gets together: Not on file    Attends religious service: Not on file    Active member of club or organization: Not on file    Attends meetings of clubs or organizations: Not on file    Relationship status: Not on file  . Intimate partner violence:    Fear of current or ex partner: Not on file    Emotionally abused: Not on file    Physically abused: Not on file    Forced sexual activity: Not on file  Other Topics Concern  . Not on file  Social History Narrative   Moved from [place] .   Lives home with wife, terri.   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  Caffeine 4 cups daily.     Step daughter is a Therapist, sports   Sister in law [name]    Exercise- walks qd      PHYSICAL EXAM  GENERAL EXAM/CONSTITUTIONAL: Vitals:  Vitals:   02/21/18 1108  BP: 118/72  Pulse: 72  Weight: 198 lb 6.4 oz (90 kg)  Height: 6' (1.829 m)     Body mass index is 26.91 kg/m. Wt Readings from Last 3 Encounters:  02/21/18 198 lb 6.4 oz (90 kg)  02/15/18 196 lb 4 oz (89 kg)  09/08/17 195 lb 6.4 oz (88.6 kg)     Patient is in no distress; well developed, nourished and groomed; neck is supple  CARDIOVASCULAR:  Examination of carotid arteries is normal; no carotid bruits  Regular rate and rhythm, no murmurs  Examination of peripheral vascular system by observation and palpation is normal  EYES:  Ophthalmoscopic exam of optic discs and posterior segments is normal; no papilledema or hemorrhages  Visual Acuity Screening   Right eye Left eye Both eyes    Without correction: 20/20 20/30   With correction:        MUSCULOSKELETAL:  Gait, strength, tone, movements  noted in Neurologic exam below  NEUROLOGIC: MENTAL STATUS:  No flowsheet data found.  awake, alert, oriented to person, place and time  recent and remote memory intact  normal attention and concentration  language fluent, comprehension intact, naming intact  fund of knowledge appropriate  CRANIAL NERVE:   2nd - no papilledema on fundoscopic exam  2nd, 3rd, 4th, 6th - pupils equal and reactive to light, visual fields full to confrontation, extraocular muscles intact, no nystagmus  5th - facial sensation symmetric  7th - facial strength symmetric  8th - hearing intact  9th - palate elevates symmetrically, uvula midline  11th - shoulder shrug symmetric  12th - tongue protrusion midline  MOTOR:   normal bulk and tone, full strength in the BUE, BLE  SENSORY:   normal and symmetric to light touch, temperature, vibration  COORDINATION:   finger-nose-finger, fine finger movements normal  REFLEXES:   deep tendon reflexes present and symmetric  GAIT/STATION:   narrow based gait     DIAGNOSTIC DATA (LABS, IMAGING, TESTING) - I reviewed patient records, labs, notes, testing and imaging myself where available.  Lab Results  Component Value Date   WBC 7.7 02/15/2018   HGB 15.8 02/15/2018   HCT 45.3 02/15/2018   MCV 97.7 02/15/2018   PLT 285.0 02/15/2018      Component Value Date/Time   NA 140 02/15/2018 1535   NA 144 08/08/2014   K 4.1 02/15/2018 1535   CL 105 02/15/2018 1535   CO2 26 02/15/2018 1535   GLUCOSE 72 02/15/2018 1535   BUN 28 (H) 02/15/2018 1535   BUN 21 08/08/2014   CREATININE 0.91 02/15/2018 1535   CALCIUM 9.7 02/15/2018 1535   PROT 5.9 (L) 08/24/2017 0836   ALBUMIN 4.0 08/24/2017 0836   AST 21 08/24/2017 0836   ALT 24 08/24/2017 0836   ALKPHOS 45 08/24/2017 0836   BILITOT 1.1 08/24/2017 0836   GFRNONAA >60  11/25/2015 1325   GFRAA >60 11/25/2015 1325   Lab Results  Component Value Date   CHOL 179 08/24/2017   HDL 77.60 08/24/2017   LDLCALC 84 08/24/2017   TRIG 85.0 08/24/2017   CHOLHDL 2 08/24/2017   Lab Results  Component Value Date   HGBA1C 5.3 03/02/2016   Lab Results  Component Value Date   VITAMINB12 371 03/02/2016   Lab Results  Component Value Date   TSH 2.71 08/12/2015    07/28/04 CT head  - Tiny age-indeterminate lacunar infarct left thalamus.  Sinus changes as above.    ASSESSMENT AND PLAN  72 y.o. year old male here with new onset headaches since spring 2019 with features for cluster headache.  He had 2 similar headaches in 2006 and 2008 was resolved spontaneously.  Offered further work-up and empiric therapy with cluster headache medications.  Patient wants to think about it and will let us know.   Dx: cluster headache vs secondary cause  1. Episodic cluster headache, not intractable     PLAN:  - consider MRI brain w/wo (rule out secondary causes) - consider sumatriptan injectable for cluster headache rescue - consider verapamil or topiramate for cluster headache prevention  Return if symptoms worsen or fail to improve.    Penni Bombard, MD 65/78/4696, 29:52 AM Certified in Neurology, Neurophysiology and Neuroimaging  [facility] [address] [place], [place] [address] [phone]

## 2018-02-21 NOTE — Patient Instructions (Signed)
-   consider MRI brain w/wo  - consider sumatriptan injectable for cluster headache rescue  - consider verapamil or topiramate for cluster headache prevention

## 2018-02-22 ENCOUNTER — Telehealth: Payer: Self-pay | Admitting: Diagnostic Neuroimaging

## 2018-02-22 DIAGNOSIS — G44019 Episodic cluster headache, not intractable: Secondary | ICD-10-CM

## 2018-02-22 NOTE — Telephone Encounter (Signed)
Patient just called and spoke with his insurance company and now he would like to proceed on having the MRI.Marland Kitchen He also informed me he is claustrophobic and would like something to help him.

## 2018-02-22 NOTE — Telephone Encounter (Signed)
I spoke to the patient and I informed him since he has [employer] we don't do Medicare in the office so I would not know exactly how much it would cost. I also informed him each place is contracted different with insurances.. I informed him the best thing would do is contact his insurance company and ask them and they can give him more information on how much it may cost. He states once he figures that out he will call and request for the MRI order to be put in.

## 2018-02-22 NOTE — Telephone Encounter (Signed)
Pt requesting a call to discuss what he would need to pay out of pocket for his MRI if he was to schedule. Please advise

## 2018-02-23 MED ORDER — ALPRAZOLAM 0.5 MG PO TABS: ORAL_TABLET | ORAL | 0 refills | Status: DC

## 2018-02-26 NOTE — Telephone Encounter (Signed)
Xanax prescribed to pharmacy for MRI.

## 2018-02-27 NOTE — Telephone Encounter (Signed)
Eric Vang have you put orders in for his MRI ?

## 2018-02-27 NOTE — Telephone Encounter (Signed)
Pt called stating GI hasn't received the order for his MRI

## 2018-02-27 NOTE — Telephone Encounter (Signed)
Dr. Leta Baptist is out and his note, states to consider MRI.  Pt is ok to proceed.  Xanax prescribed for anxiety. Order placed needs cosigning.

## 2018-02-27 NOTE — Addendum Note (Signed)
Addended by: Brandon Melnick on: 02/27/2018 09:04 AM   Modules accepted: Orders

## 2018-03-05 NOTE — Telephone Encounter (Signed)
Patient is scheduled for his MRI at [facility] .

## 2018-03-13 ENCOUNTER — Telehealth: Payer: Self-pay | Admitting: *Deleted

## 2018-03-13 NOTE — Telephone Encounter (Signed)
Received my chart message: I have attached the findings of a 2003 MRI of my brain that I thought you might find interesting given this week's scheduled procedure. The document also may be of general value as part of my medical history in My Chart. 09/14/2001 MRI brain report printed; placed on Dr [facility] desk for review.

## 2018-03-15 MED ORDER — SUMATRIPTAN SUCCINATE 100 MG PO TABS: 100.0000 mg | ORAL_TABLET | Freq: Once | ORAL | 6 refills | Status: DC | PRN

## 2018-03-15 MED ORDER — TOPIRAMATE 50 MG PO TABS: 50.0000 mg | ORAL_TABLET | Freq: Two times a day (BID) | ORAL | 12 refills | Status: DC

## 2018-03-15 NOTE — Addendum Note (Signed)
Addended by: Andrey Spearman R on: 03/15/2018 03:35 PM   Modules accepted: Orders

## 2018-03-17 ENCOUNTER — Ambulatory Visit
Admission: RE | Admit: 2018-03-17 | Discharge: 2018-03-17 | Disposition: A | Discharge status: Home / Self Care | Payer: Medicare HMO | Source: Ambulatory Visit | Attending: Neurology | Admitting: Neurology

## 2018-03-17 DIAGNOSIS — G44019 Episodic cluster headache, not intractable: Secondary | ICD-10-CM

## 2018-03-19 ENCOUNTER — Telehealth: Payer: Self-pay | Admitting: *Deleted

## 2018-03-19 NOTE — Telephone Encounter (Signed)
Received my chart message: I went to [facility] Saturday for the MRI but was unable to undergo the procedure do to claustrophobia, despite having taken both alprazolam tablets as directed. Perhaps someone on your staff could call [facility] ([place] at [phone] to see if their records indicate what I was given for my brain MRI there on 09/17/2001 (Patient ID No. [id number]). I think it may have been valium. Whatever it was placed me into a twilight sleep and worked well. I know a current MRI could be useful. This RN replied and advised a release of information would most likely be needed. Patient replied asking if alternative to alprazolam would be more effective if records not obtained. Will route to Dr Leta Baptist.

## 2018-03-19 NOTE — Telephone Encounter (Signed)
Pt is asking for a returned call from [place]

## 2018-03-19 NOTE — Telephone Encounter (Signed)
Called patient who stated he got a call from [facility] imaging to reschedule MRI. He also called MRI facility in [place] to find out what pre med he was given in 2003 and was told those records had been purged. He stated if Dr Leta Baptist thought he should go to [facility] for open MRI he would, however he still felt he'd need medication. He is claustrophobic.  He stated he has never taken Valium but wondered if that was the medication given to him in 2003. He wants to have MRI. This RN stated will let Dr Leta Baptist know of this conversation and call him back. He verbalized understanding, appreciation.

## 2018-03-20 NOTE — Telephone Encounter (Signed)
Spoke with patient and informed him Dr Colen Darling would agree to prescribe Valium for MRI or he can consider going to [facility] and receiving sedation. He requested to think about it and call back.  He verbalizied understanding of call.

## 2018-03-20 NOTE — Telephone Encounter (Signed)
Returned patient's call who stated he has decided he wants to have MRI brain at the hospital under sedation.  This RN advised it will have to go through insurance authorization again, and he will then get a call to schedule it.  He verbalized understanding, appreciation.

## 2018-03-20 NOTE — Telephone Encounter (Signed)
Pt requesting a call stating he has made a decision regarding his MRI, did not wish to discuss further

## 2018-04-02 ENCOUNTER — Encounter: Payer: Self-pay | Admitting: *Deleted

## 2018-04-02 ENCOUNTER — Other Ambulatory Visit: Payer: Self-pay | Admitting: *Deleted

## 2018-04-02 DIAGNOSIS — G44019 Episodic cluster headache, not intractable: Secondary | ICD-10-CM

## 2018-04-02 NOTE — Telephone Encounter (Signed)
My chart message sent to patient to inform him MRI was ordered. It will be process on Thursday when the office reopens.

## 2018-04-05 NOTE — Telephone Encounter (Signed)
Eric Vang contacted the patient and he is scheduled for 05/01/18 at [facility].

## 2018-04-05 NOTE — Telephone Encounter (Signed)
Updated his [employer] Auth: [id number] (exp. 06/03/18).. I left a voicemail with Tosha with [facility] to give a call back to schedule his MRI sedated. I left all my information for her to call me back.

## 2018-04-27 ENCOUNTER — Encounter (HOSPITAL_COMMUNITY): Payer: Self-pay | Admitting: *Deleted

## 2018-04-27 ENCOUNTER — Other Ambulatory Visit: Payer: Self-pay

## 2018-04-27 NOTE — Progress Notes (Addendum)
The last H/P that is in our system was 02/21/2018- at [facility]; the MRI was ordered by a Dr there. I called [facility] @ 14:10 today, the office was closed for the day.  I spoke to Mr Geanie Logan and ask if he had seen a MD since 02/2018 and he has not.  I explained that to have anesthesia, that he has to have had a H/P in the last 30 days.  I informed Mr Tickner that I reached out to [facility], but  office was closed. Mr Lenker is going to call Dr Ethel Rana offic eon Monday and see if he is able to be seen on Monday.  Patient will call me on Monday and let me know.

## 2018-04-30 ENCOUNTER — Ambulatory Visit (INDEPENDENT_AMBULATORY_CARE_PROVIDER_SITE_OTHER): Payer: Medicare HMO | Admitting: Internal Medicine

## 2018-04-30 ENCOUNTER — Encounter: Payer: Self-pay | Admitting: Internal Medicine

## 2018-04-30 ENCOUNTER — Telehealth: Payer: Self-pay | Admitting: Diagnostic Neuroimaging

## 2018-04-30 VITALS — BP 119/78 | HR 66 | Temp 97.7°F | Resp 16 | Ht 72.0 in | Wt 196.2 lb

## 2018-04-30 DIAGNOSIS — R519 Headache, unspecified: Secondary | ICD-10-CM

## 2018-04-30 DIAGNOSIS — R51 Headache: Secondary | ICD-10-CM | POA: Diagnosis not present

## 2018-04-30 DIAGNOSIS — G8929 Other chronic pain: Secondary | ICD-10-CM

## 2018-04-30 NOTE — Progress Notes (Signed)
Encounter opened in error

## 2018-04-30 NOTE — Progress Notes (Signed)
Subjective:    Patient ID: Eric Vang, male    DOB: [birth year], 73 y.o.   MRN: [id number]  DOS:  04/30/2018 Type of visit - description: checkup  Since the last office visit, saw neurology, headaches had some features of cluster. Eventually he was recommended to do a brain MRI, due to claustrophobia has not been able to complete the test, they are planning to do that under general anesthesia tomorrow.  Needs a office visit before the procedure.  Today, he reports that the headaches have decrease with current medications. He remains extremely active without any problems.   Review of Systems No chest pain no difficulty breathing.  No DOE No lower extremity edema or palpitations No nausea, vomiting, diarrhea  Past Medical History:  Diagnosis Date  . Anemia   . Basal cell carcinoma of nose 2017   removed. a small area right eye  . CLUSTER HEADACHE 01/15/2007   x 1 time  . Cluster headaches   . Coronary artery disease   . DIVERTICULOSIS, COLON 02/04/2008  . Elevated PSA    plan possible prostate Bx w/ urology  . Hyperlipidemia    started statin 2015   . Inguinal hernia    right  . Insomnia   . Scrotal varices   . Spermatocele of epididymis, single   . Unilateral inguinal hernia 2017    Past Surgical History:  Procedure Laterality Date  . CARDIAC CATHETERIZATION     > 20 years ago  . COLONOSCOPY      x 2 no polyps  . COLONOSCOPY W/ POLYPECTOMY  2016 ish  . INGUINAL HERNIA REPAIR Right 12/03/2015   Procedure: LAPAROSCOPIC RIGHT INGUINAL HERNIA REPAIR;  Surgeon: [name], MD;  Location: [facility];  Service: General;  Laterality: Right;  . INSERTION OF MESH Right 12/03/2015   Procedure: INSERTION OF MESH;  Surgeon: [name], MD;  Location: [facility];  Service: General;  Laterality: Right;  . MOHS SURGERY  2017   L nose     Social History   Socioeconomic History  . Marital status: Married    Spouse name: Not on file  . Number of children: 1  . Years of  education: Not on file  . Highest education level: Not on file  Occupational History  . Occupation: fully retired Scientist, water quality  Social Needs  . Financial resource strain: Not on file  . Food insecurity:    Worry: Not on file    Inability: Not on file  . Transportation needs:    Medical: Not on file    Non-medical: Not on file  Tobacco Use  . Smoking status: Former Smoker    Years: 7.00    Types: Cigarettes  . Smokeless tobacco: Never Used  . Tobacco comment: stopped in 1979, was a light smoker  Substance and Sexual Activity  . Alcohol use: Not Currently    Alcohol/week: 0.0 standard drinks  . Drug use: No  . Sexual activity: Not on file  Lifestyle  . Physical activity:    Days per week: Not on file    Minutes per session: Not on file  . Stress: Not on file  Relationships  . Social connections:    Talks on phone: Not on file    Gets together: Not on file    Attends religious service: Not on file    Active member of club or organization: Not on file    Attends meetings of clubs or organizations: Not on file  Relationship status: Not on file  . Intimate partner violence:    Fear of current or ex partner: Not on file    Emotionally abused: Not on file    Physically abused: Not on file    Forced sexual activity: Not on file  Other Topics Concern  . Not on file  Social History Narrative   Moved from [place] .   Lives home with wife, terri.   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  Caffeine 4 cups daily.     Step daughter is a Therapist, sports   Sister in law [name]    Exercise- walks qd       Allergies as of 04/30/2018   No Known Allergies     Medication List       Accurate as of April 30, 2018  2:24 PM. Always use your most recent med list.        ALPRAZolam 0.5 MG tablet Commonly known as:  XANAX Take one tablet 1 hour prior to MRI, and another tablet if needed when arrives at facility.  NEEDS DRIVER.     aspirin EC 81 MG tablet Take 81 mg by mouth every evening.   simvastatin 10 MG tablet Commonly known as:  ZOCOR Take 1 tablet (10 mg total) by mouth daily.   SUMAtriptan 100 MG tablet Commonly known as:  IMITREX Take 1 tablet (100 mg total) by mouth once as needed for migraine. May repeat x 1 after 2 hours; maximum 2 tabs per day and 8 tabs per month   tamsulosin 0.4 MG Caps capsule Commonly known as:  FLOMAX Take 1 capsule (0.4 mg total) by mouth daily.   topiramate 50 MG tablet Commonly known as:  TOPAMAX Take 1 tablet (50 mg total) by mouth 2 (two) times daily.           Objective:   Physical Exam BP 119/78   Pulse 66   Temp 97.7 F (36.5 C) (Oral)   Resp 16   Ht 6' (1.829 m)   Wt 196 lb 3 oz (89 kg)   SpO2 98%   BMI 26.61 kg/m  General:   Well developed, NAD, BMI noted.  HEENT:  Normocephalic . Face symmetric, atraumatic Lungs:  CTA B Normal respiratory effort, no intercostal retractions, no accessory muscle use. Heart: RRR,  no murmur.  no pretibial edema bilaterally  Abdomen:  Not distended, soft, non-tender. No rebound or rigidity.   Skin: Not pale. Not jaundice Neurologic:  alert & oriented X3.  Speech normal, gait appropriate for age and unassisted Psych--  Cognition and judgment appear intact.  Cooperative with normal attention span and concentration.  Behavior appropriate. No anxious or depressed appearing.     Assessment       Assessment Hyperlipidemia , rx statins 2015 Insomnia BCC, nose 2017 Cold sores, prn Valtrex H/o cluster HAs 1 -- 2006 Elevated PSA 08-2015, UCX (-), s/p abx, saw urology, DRE benign except for mild enlargement, PSA decreased, dx w/  Sx of bladder outlet obstruction  PLAN: Headaches: Probably cluster headache, needs a brain MRI under general anesthesia, Currently doing better on topiramate twice a day.  In the last 2 months, he only had 4 breakthrough headaches that responded to sumatriptan. I do not see any  contraindication for the patient to proceed with MRI under general anesthesia. Had surgery in 2017, hernia repair, he had no problems with anesthesia except mild constipation afterwards . Denies any cardiopulmonary symptoms  today.

## 2018-04-30 NOTE — Telephone Encounter (Signed)
Thayer Headings [facility] has called re: pt's schedule MRI for tomorrow 01-28.  Thayer Headings states she needs a health & p[hysical history within the past 30 days, last they have is from Nov 2019 which can not be used.  Thayer Headings is asking for a call from [facility] as soon as RN Lovey Newcomer is available, Thayer Headings says a message can be left if she is on another call when RN Lovey Newcomer calls her back

## 2018-04-30 NOTE — Progress Notes (Signed)
Eric Vang has an appointment with Dr. Larose Kells at 2 -pm today.

## 2018-04-30 NOTE — Assessment & Plan Note (Signed)
Headaches: Probably cluster headache, needs a brain MRI under general anesthesia, Currently doing better on topiramate twice a day.  In the last 2 months, he only had 4 breakthrough headaches that responded to sumatriptan. I do not see any contraindication for the patient to proceed with MRI under general anesthesia. Had surgery in 2017, hernia repair, he had no problems with anesthesia except mild constipation afterwards . Denies any cardiopulmonary symptoms today.

## 2018-04-30 NOTE — Telephone Encounter (Addendum)
I called Thayer Headings.  Needs H & P within 30 days for MRI under anesthesia.  He has appt with pcp today at 1400.

## 2018-04-30 NOTE — Progress Notes (Signed)
Pre visit review using our clinic review tool, if applicable. No additional management support is needed unless otherwise documented below in the visit note. 

## 2018-04-30 NOTE — Patient Instructions (Signed)
Please share the results of your previous MRI with your neurologist as well as the films

## 2018-05-01 ENCOUNTER — Ambulatory Visit (HOSPITAL_COMMUNITY)
Admission: RE | Admit: 2018-05-01 | Discharge: 2018-05-01 | Disposition: A | Discharge status: Home / Self Care | Payer: Medicare HMO | Source: Ambulatory Visit | Attending: Diagnostic Neuroimaging | Admitting: Diagnostic Neuroimaging

## 2018-05-01 ENCOUNTER — Ambulatory Visit (HOSPITAL_COMMUNITY): Payer: Medicare HMO | Admitting: Certified Registered Nurse Anesthetist

## 2018-05-01 ENCOUNTER — Ambulatory Visit (HOSPITAL_COMMUNITY)
Admission: AD | Admit: 2018-05-01 | Discharge: 2018-05-01 | Disposition: A | Discharge status: Home / Self Care | Payer: Medicare HMO | Source: Ambulatory Visit | Attending: Diagnostic Neuroimaging | Admitting: Diagnostic Neuroimaging

## 2018-05-01 ENCOUNTER — Encounter (HOSPITAL_COMMUNITY): Payer: Self-pay

## 2018-05-01 ENCOUNTER — Encounter (HOSPITAL_COMMUNITY)
Admission: AD | Disposition: A | Discharge status: Home / Self Care | Payer: Self-pay | Source: Ambulatory Visit | Attending: Diagnostic Neuroimaging

## 2018-05-01 DIAGNOSIS — Z8673 Personal history of transient ischemic attack (TIA), and cerebral infarction without residual deficits: Secondary | ICD-10-CM | POA: Insufficient documentation

## 2018-05-01 DIAGNOSIS — J3489 Other specified disorders of nose and nasal sinuses: Secondary | ICD-10-CM | POA: Insufficient documentation

## 2018-05-01 DIAGNOSIS — F40240 Claustrophobia: Secondary | ICD-10-CM | POA: Insufficient documentation

## 2018-05-01 DIAGNOSIS — R69 Illness, unspecified: Secondary | ICD-10-CM | POA: Diagnosis not present

## 2018-05-01 DIAGNOSIS — Z87891 Personal history of nicotine dependence: Secondary | ICD-10-CM | POA: Insufficient documentation

## 2018-05-01 DIAGNOSIS — G44019 Episodic cluster headache, not intractable: Secondary | ICD-10-CM | POA: Diagnosis not present

## 2018-05-01 DIAGNOSIS — E785 Hyperlipidemia, unspecified: Secondary | ICD-10-CM | POA: Diagnosis not present

## 2018-05-01 DIAGNOSIS — R51 Headache: Secondary | ICD-10-CM | POA: Diagnosis not present

## 2018-05-01 HISTORY — DX: Atherosclerotic heart disease of native coronary artery without angina pectoris: I25.10

## 2018-05-01 HISTORY — DX: Cluster headache syndrome, unspecified, not intractable: G44.009

## 2018-05-01 HISTORY — PX: RADIOLOGY WITH ANESTHESIA: SHX6223

## 2018-05-01 HISTORY — DX: Anemia, unspecified: D64.9

## 2018-05-01 LAB — BASIC METABOLIC PANEL
ANION GAP: 12 (ref 5–15)
BUN: 18 mg/dL (ref 8–23)
CO2: 17 mmol/L — ABNORMAL LOW (ref 22–32)
CREATININE: 0.97 mg/dL (ref 0.61–1.24)
Calcium: 9 mg/dL (ref 8.9–10.3)
Chloride: 110 mmol/L (ref 98–111)
GFR calc Af Amer: >60 mL/min (ref >60)
GFR calc non Af Amer: >60 mL/min (ref >60)
Glucose, Bld: 93 mg/dL (ref 70–99)
Potassium: 3.7 mmol/L (ref 3.5–5.1)
Sodium: 139 mmol/L (ref 135–145)

## 2018-05-01 LAB — CBC
HCT: 44.3 % (ref 39.0–52.0)
Hemoglobin: 15 g/dL (ref 13.0–17.0)
MCH: 33 pg (ref 26.0–34.0)
MCHC: 33.9 g/dL (ref 30.0–36.0)
MCV: 97.6 fL (ref 80.0–100.0)
NRBC: 0 % (ref 0.0–0.2)
Platelets: 238 10*3/uL (ref 150–400)
RBC: 4.54 MIL/uL (ref 4.22–5.81)
RDW: 12.8 % (ref 11.5–15.5)
WBC: 4.4 10*3/uL (ref 4.0–10.5)

## 2018-05-01 SURGERY — MRI WITH ANESTHESIA
Anesthesia: General

## 2018-05-01 MED ORDER — FENTANYL CITRATE (PF) 100 MCG/2ML IJ SOLN
INTRAMUSCULAR | Status: DC | PRN
  Administered 2018-05-01 (×2): 50 ug via INTRAVENOUS

## 2018-05-01 MED ORDER — DEXAMETHASONE SODIUM PHOSPHATE 10 MG/ML IJ SOLN
INTRAMUSCULAR | Status: DC | PRN
  Administered 2018-05-01: 10 mg via INTRAVENOUS

## 2018-05-01 MED ORDER — LIDOCAINE 2% (20 MG/ML) 5 ML SYRINGE
INTRAMUSCULAR | Status: DC | PRN
  Administered 2018-05-01: 80 mg via INTRAVENOUS

## 2018-05-01 MED ORDER — MIDAZOLAM HCL 5 MG/5ML IJ SOLN
INTRAMUSCULAR | Status: DC | PRN
  Administered 2018-05-01: 2 mg via INTRAVENOUS

## 2018-05-01 MED ORDER — GADOBUTROL 1 MMOL/ML IV SOLN
8.0000 mL | Freq: Once | INTRAVENOUS | Status: AC | PRN
  Administered 2018-05-01: 8 mL via INTRAVENOUS

## 2018-05-01 MED ORDER — LACTATED RINGERS IV SOLN
INTRAVENOUS | Status: DC | PRN
  Administered 2018-05-01: 08:00:00 via INTRAVENOUS

## 2018-05-01 MED ORDER — ONDANSETRON HCL 4 MG/2ML IJ SOLN
INTRAMUSCULAR | Status: DC | PRN
  Administered 2018-05-01: 4 mg via INTRAVENOUS

## 2018-05-01 MED ORDER — PROPOFOL 10 MG/ML IV BOLUS
INTRAVENOUS | Status: DC | PRN
  Administered 2018-05-01: 200 mg via INTRAVENOUS

## 2018-05-01 NOTE — Transfer of Care (Signed)
Immediate Anesthesia Transfer of Care Note  Patient: Eric Vang  Procedure(s) Performed: MRI WITH ANESTHESIA   BRAIN WITH AND WITHOUT (N/A )  Patient Location: PACU  Anesthesia Type:General  Level of Consciousness: awake, alert  and oriented  Airway & Oxygen Therapy: Patient Spontanous Breathing and Patient connected to face mask oxygen  Post-op Assessment: Report given to RN and Post -op Vital signs reviewed and stable  Post vital signs: Reviewed and stable  Last Vitals:  Vitals Value Taken Time  BP 122/77 05/01/2018 10:06 AM  Temp    Pulse 69 05/01/2018 10:06 AM  Resp 16 05/01/2018 10:06 AM  SpO2 100 % 05/01/2018 10:06 AM  Vitals shown include unvalidated device data.  Last Pain:  Vitals:   05/01/18 0743  TempSrc: Oral  PainSc: 0-No pain      Patients Stated Pain Goal: 0 (86/76/72 0947)  Complications: No apparent anesthesia complications

## 2018-05-01 NOTE — Anesthesia Preprocedure Evaluation (Signed)
Anesthesia Evaluation  Patient identified by MRN, date of birth, ID band Patient awake    History of Anesthesia Complications Negative for: history of anesthetic complications  Airway Mallampati: II  TM Distance: >3 FB     Dental  (+) Teeth Intact, Dental Advisory Given   Pulmonary former smoker,    breath sounds clear to auscultation       Cardiovascular negative cardio ROS   Rhythm:Regular Rate:Normal     Neuro/Psych  Headaches, negative psych ROS   GI/Hepatic Neg liver ROS,   Endo/Other    Renal/GU negative Renal ROS  negative genitourinary   Musculoskeletal negative musculoskeletal ROS (+)   Abdominal   Peds  Hematology  (+) Blood dyscrasia, anemia ,   Anesthesia Other Findings   Reproductive/Obstetrics                             Anesthesia Physical  Anesthesia Plan  ASA: II  Anesthesia Plan: General   Post-op Pain Management:    Induction: Intravenous  PONV Risk Score and Plan: 1 and Treatment may vary due to age or medical condition and Ondansetron  Airway Management Planned: Oral ETT  Additional Equipment:   Intra-op Plan:   Post-operative Plan: Extubation in OR  Informed Consent:   Plan Discussed with: CRNA, Anesthesiologist and Surgeon  Anesthesia Plan Comments: (  )        Anesthesia Quick Evaluation

## 2018-05-02 ENCOUNTER — Encounter (HOSPITAL_COMMUNITY): Payer: Self-pay | Admitting: Radiology

## 2018-05-02 ENCOUNTER — Telehealth: Payer: Self-pay | Admitting: *Deleted

## 2018-05-02 NOTE — Telephone Encounter (Signed)
Spoke to pt and relayed that per Dr. Leta Baptist, unremarkable MRI study. No major finding.s  Continue current plan.  Pt verbalized understanding.  Pt had question about taking topirmate longterm.  If went away for 50yrs would they do the same again?  Break thru headache wsa helped by imitrex tabs.  Please advise.  Also f/u appt?

## 2018-05-02 NOTE — Telephone Encounter (Signed)
-----   Message from Penni Bombard, MD sent at 05/01/2018  3:48 PM EST ----- Unremarkable study. No major findings. Please call patient. Continue current plan. -VRP

## 2018-05-02 NOTE — Telephone Encounter (Signed)
Hard to know prognosis. If HA continue regularly (1 per week or more), then try prevention meds. Otherwise, ok to monitor for now. Follow up as needed. -VRP

## 2018-05-03 NOTE — Telephone Encounter (Signed)
LMVM for pt on cell #. Hard to say how you will do. Can try stopping the topamax/topiramate and see how you do, if headaches return (having 1 or more in week) restart back on preventative med (topamax).  Can f/u as needed.  Call back if questions.

## 2018-05-03 NOTE — Telephone Encounter (Signed)
I spoke to pt and he will continue with taking the topamax for now and will call us if decides to stop taking.  He may dsicontinue one tablet for 3 days then stop when he decides to stop.  He will take imitrex prn.  He verbalized understanding of plan.

## 2018-05-04 DIAGNOSIS — D2271 Melanocytic nevi of right lower limb, including hip: Secondary | ICD-10-CM | POA: Diagnosis not present

## 2018-05-04 DIAGNOSIS — D2272 Melanocytic nevi of left lower limb, including hip: Secondary | ICD-10-CM | POA: Diagnosis not present

## 2018-05-04 DIAGNOSIS — Z85828 Personal history of other malignant neoplasm of skin: Secondary | ICD-10-CM | POA: Diagnosis not present

## 2018-05-04 DIAGNOSIS — D1801 Hemangioma of skin and subcutaneous tissue: Secondary | ICD-10-CM | POA: Diagnosis not present

## 2018-05-04 DIAGNOSIS — L820 Inflamed seborrheic keratosis: Secondary | ICD-10-CM | POA: Diagnosis not present

## 2018-05-04 DIAGNOSIS — L812 Freckles: Secondary | ICD-10-CM | POA: Diagnosis not present

## 2018-05-04 DIAGNOSIS — L570 Actinic keratosis: Secondary | ICD-10-CM | POA: Diagnosis not present

## 2018-05-04 DIAGNOSIS — L821 Other seborrheic keratosis: Secondary | ICD-10-CM | POA: Diagnosis not present

## 2018-06-02 ENCOUNTER — Other Ambulatory Visit: Payer: Self-pay | Admitting: Internal Medicine

## 2018-08-10 IMAGING — US US AORTA SCREENING (MEDICARE)
1 series · 10 of 10 positions shown · non-contrast
Comparison: None.

CLINICAL DATA: Male between 65-75 years of age with a smoking
history.

EXAM:
US ABDOMINAL AORTA MEDICARE SCREENING
TECHNIQUE: Ultrasound examination of the abdominal aorta was performed as a
screening evaluation for abdominal aortic aneurysm.

[Series 1: us aorta screening (medicare) · 0.20mm/px · 10 of 10 slices shown]
[im 1/10]
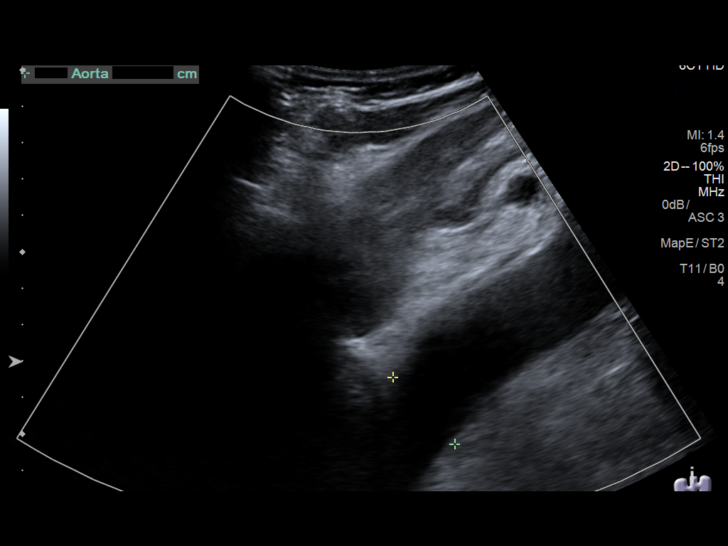
[im 2/10]
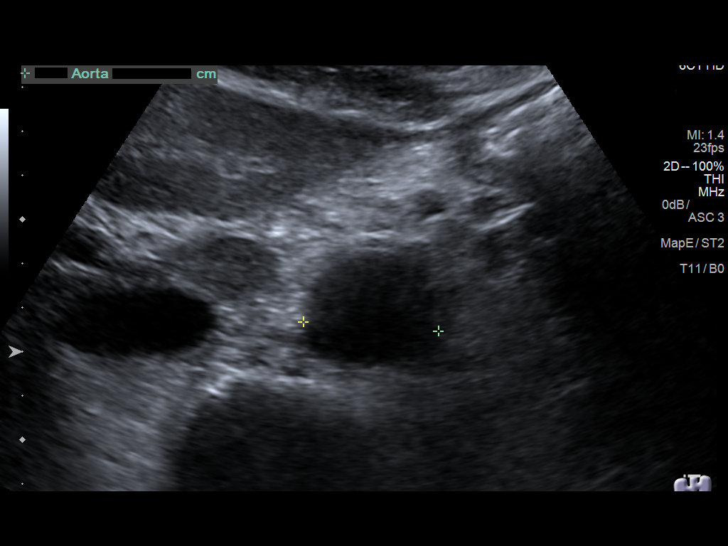
[im 3/10]
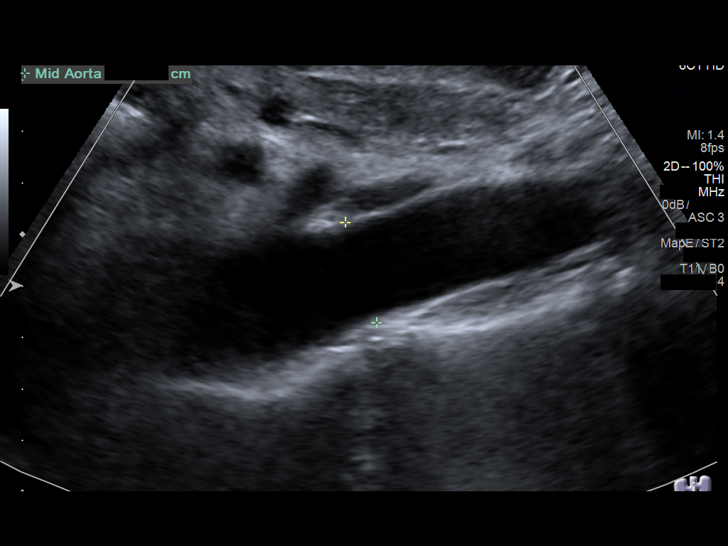
[im 4/10]
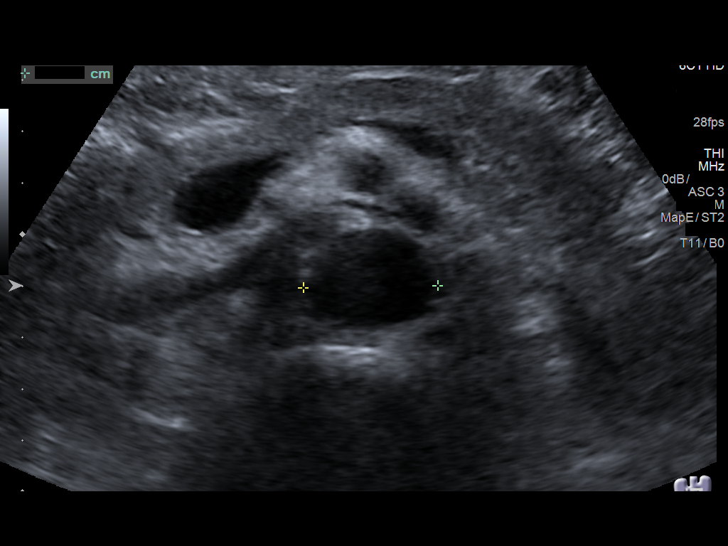
[im 5/10]
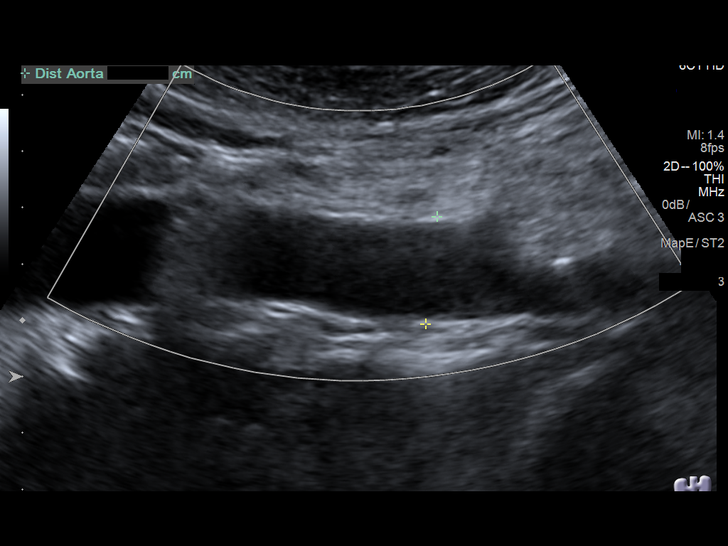
[im 6/10]
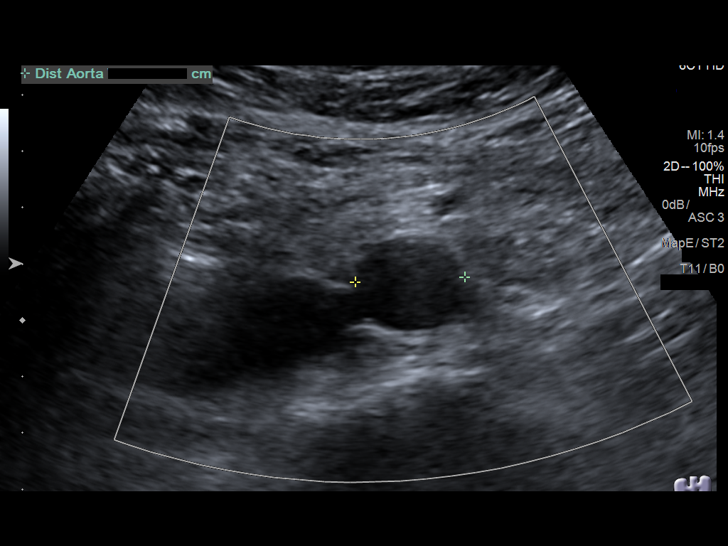
[im 7/10]
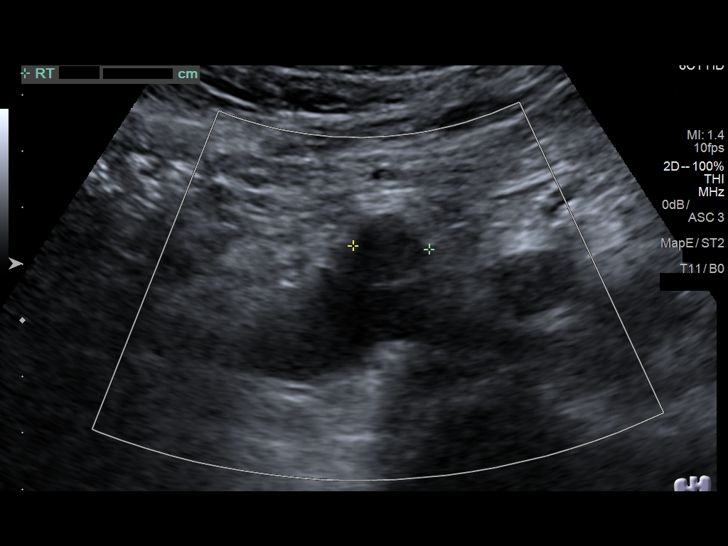
[im 8/10]
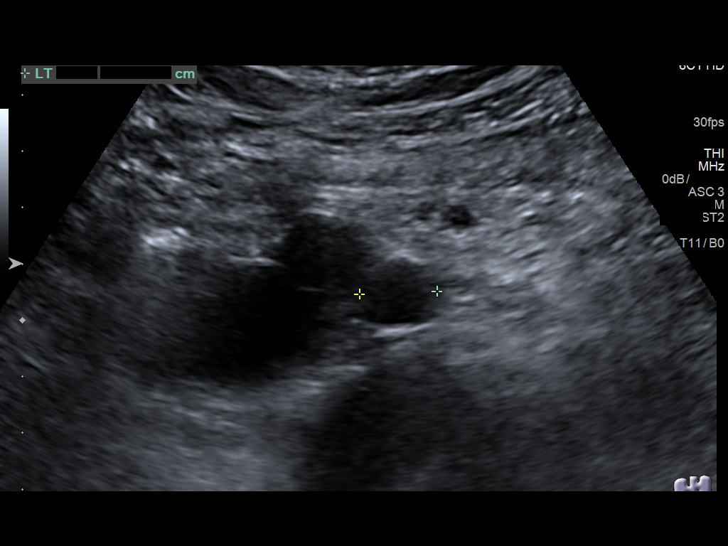
[im 9/10]
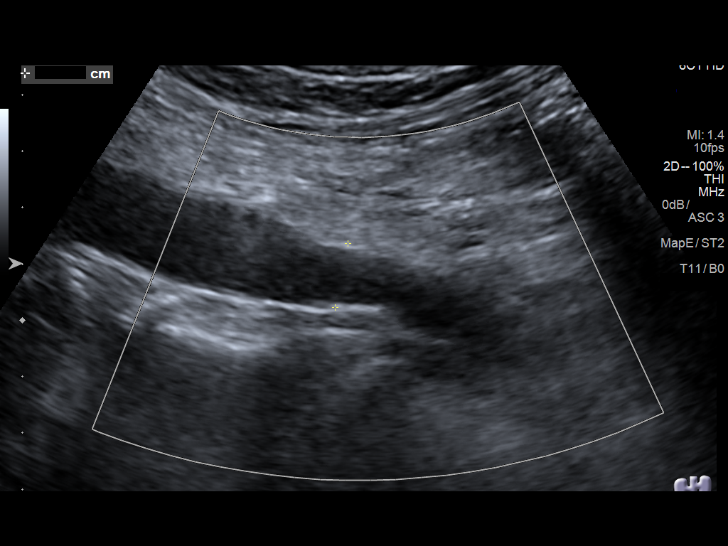
[im 10/10]
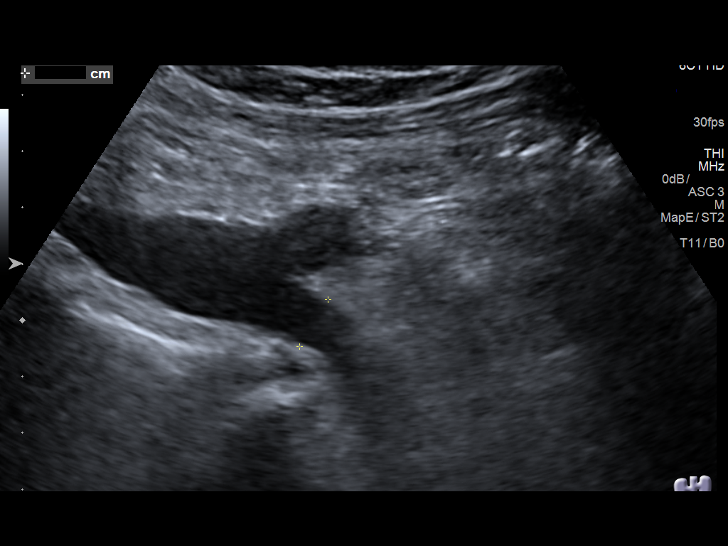

[10 of 10 positions shown; findings below may reference images not displayed]

FINDINGS: Aorta:

Proximal diameter:  2.5 cm x 3.1 cm

Mid diameter:  2.0 cm

Distal diameter:  1.9 cm

Right iliac diameter:  1.4 cm

Left iliac diameter:  1.4 cm
IMPRESSION: Suprarenal aorta measures as great as 3.1 cm. Recommend followup by
ultrasound in 3 years. This recommendation follows ACR consensus
guidelines: White Paper of the ACR Incidental Findings Committee II
on Vascular Findings. [HOSPITAL] 2524; [DATE]

## 2018-08-29 ENCOUNTER — Encounter: Payer: Medicare HMO | Admitting: Internal Medicine

## 2018-09-19 ENCOUNTER — Telehealth: Payer: Self-pay | Admitting: Internal Medicine

## 2018-09-19 NOTE — Telephone Encounter (Signed)
LVM for pt to call and schedule CPE, pt was requesting a cpe. Done

## 2018-09-28 DIAGNOSIS — Z0100 Encounter for examination of eyes and vision without abnormal findings: Secondary | ICD-10-CM | POA: Diagnosis not present

## 2018-10-08 ENCOUNTER — Ambulatory Visit (INDEPENDENT_AMBULATORY_CARE_PROVIDER_SITE_OTHER): Payer: Medicare HMO | Admitting: Internal Medicine

## 2018-10-08 ENCOUNTER — Encounter: Payer: Self-pay | Admitting: Internal Medicine

## 2018-10-08 ENCOUNTER — Other Ambulatory Visit: Payer: Self-pay

## 2018-10-08 VITALS — BP 131/83 | HR 69 | Temp 97.7°F | Resp 16 | Ht 72.0 in | Wt 189.5 lb

## 2018-10-08 DIAGNOSIS — Z0000 Encounter for general adult medical examination without abnormal findings: Secondary | ICD-10-CM | POA: Diagnosis not present

## 2018-10-08 DIAGNOSIS — I7789 Other specified disorders of arteries and arterioles: Secondary | ICD-10-CM | POA: Diagnosis not present

## 2018-10-08 DIAGNOSIS — R972 Elevated prostate specific antigen [PSA]: Secondary | ICD-10-CM

## 2018-10-08 DIAGNOSIS — E785 Hyperlipidemia, unspecified: Secondary | ICD-10-CM | POA: Diagnosis not present

## 2018-10-08 LAB — COMPREHENSIVE METABOLIC PANEL
ALT: 19 U/L (ref 0–53)
AST: 22 U/L (ref 0–37)
Albumin: 4.5 g/dL (ref 3.5–5.2)
Alkaline Phosphatase: 61 U/L (ref 39–117)
BUN: 16 mg/dL (ref 6–23)
CO2: 27 mEq/L (ref 19–32)
Calcium: 9 mg/dL (ref 8.4–10.5)
Chloride: 104 mEq/L (ref 96–112)
Creatinine, Ser: 0.86 mg/dL (ref 0.40–1.50)
GFR: 87.58 mL/min (ref >60.00)
Glucose, Bld: 71 mg/dL (ref 70–99)
Potassium: 4 mEq/L (ref 3.5–5.1)
Sodium: 140 mEq/L (ref 135–145)
Total Bilirubin: 1.2 mg/dL (ref 0.2–1.2)
Total Protein: 6.4 g/dL (ref 6.0–8.3)

## 2018-10-08 LAB — LIPID PANEL
Cholesterol: 166 mg/dL (ref 0–200)
HDL: 76.5 mg/dL (ref >39.00)
LDL Cholesterol: 73 mg/dL (ref 0–99)
NonHDL: 89.46
Total CHOL/HDL Ratio: 2
Triglycerides: 83 mg/dL (ref 0.0–149.0)
VLDL: 16.6 mg/dL (ref 0.0–40.0)

## 2018-10-08 LAB — TSH: TSH: 2.6 u[IU]/mL (ref 0.35–4.50)

## 2018-10-08 LAB — PSA: PSA: 5.92 ng/mL — ABNORMAL HIGH (ref 0.10–4.00)

## 2018-10-08 NOTE — Progress Notes (Signed)
Subjective:    Patient ID: Eric Vang, male    DOB: 1945/10/04, 73 y.o.   MRN: [id number]  DOS:  10/08/2018 Type of visit - description: CPX Since the last office visit, he is doing well. Cluster headaches resolved.    Review of Systems C/o mild LUTS, occasional difficulty urinating but no dysuria, gross hematuria or other problems  Other than above, a 14 point review of systems is negative     Past Medical History:  Diagnosis Date  . Anemia   . Basal cell carcinoma of nose 2017   removed. a small area right eye  . CLUSTER HEADACHE 01/15/2007   2006, 2019  . Cluster headaches   . Coronary artery disease   . DIVERTICULOSIS, COLON 02/04/2008  . Elevated PSA    plan possible prostate Bx w/ urology  . Hyperlipidemia    started statin 2015   . Inguinal hernia    right  . Insomnia   . Scrotal varices   . Spermatocele of epididymis, single   . Unilateral inguinal hernia 2017    Past Surgical History:  Procedure Laterality Date  . CARDIAC CATHETERIZATION     > 20 years ago  . COLONOSCOPY      x 2 no polyps  . COLONOSCOPY W/ POLYPECTOMY  2016 ish  . INGUINAL HERNIA REPAIR Right 12/03/2015   Procedure: LAPAROSCOPIC RIGHT INGUINAL HERNIA REPAIR;  Surgeon: [name], MD;  Location: [facility];  Service: General;  Laterality: Right;  . INSERTION OF MESH Right 12/03/2015   Procedure: INSERTION OF MESH;  Surgeon: [name], MD;  Location: [facility];  Service: General;  Laterality: Right;  . MOHS SURGERY  2017   L nose   . RADIOLOGY WITH ANESTHESIA N/A 05/01/2018   Procedure: MRI WITH ANESTHESIA   BRAIN WITH AND WITHOUT;  Surgeon: Radiologist, Medication, MD;  Location: [facility];  Service: Radiology;  Laterality: N/A;    Social History   Socioeconomic History  . Marital status: Married    Spouse name: Not on file  . Number of children: 1  . Years of education: Not on file  . Highest education level: Not on file  Occupational History  . Occupation: fully retired Education officer, environmental  Social Needs  . Financial resource strain: Not on file  . Food insecurity    Worry: Not on file    Inability: Not on file  . Transportation needs    Medical: Not on file    Non-medical: Not on file  Tobacco Use  . Smoking status: Former Smoker    Years: 7.00    Types: Cigarettes  . Smokeless tobacco: Never Used  . Tobacco comment: stopped in 1979, was a light smoker  Substance and Sexual Activity  . Alcohol use: Not Currently    Alcohol/week: 0.0 standard drinks  . Drug use: No  . Sexual activity: Not on file  Lifestyle  . Physical activity    Days per week: Not on file    Minutes per session: Not on file  . Stress: Not on file  Relationships  . Social Herbalist on phone: Not on file    Gets together: Not on file    Attends religious service: Not on file    Active member of club or organization: Not on file    Attends meetings of clubs or organizations: Not on file    Relationship status: Not on file  . Intimate partner violence  Fear of current or ex partner: Not on file    Emotionally abused: Not on file    Physically abused: Not on file    Forced sexual activity: Not on file  Other Topics Concern  . Not on file  Social History Narrative   Moved from [place] .   Lives home with wife, terri. 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  Caffeine 4 cups daily.     Step daughter is a Therapist, sports   Sister in law [name]    Exercise- walks qd      Family History  Problem Relation Age of Onset  . Diabetes Mother 74       natural causes.   . Atrial fibrillation Mother   . Uterine cancer Mother   . Cancer Father 42       bone   . Cancer Paternal Grandmother        sinus cancer  . Colon cancer Neg Hx   . Prostate cancer Neg Hx   . CAD Neg Hx     Allergies as of 10/08/2018   No Known Allergies     Medication List       Accurate as of October 08, 2018  9:04 PM. If you have any questions, ask your  nurse or doctor.        STOP taking these medications   topiramate 50 MG tablet Commonly known as: TOPAMAX Stopped by: Kathlene November, MD     TAKE these medications   aspirin EC 81 MG tablet Take 81 mg by mouth every evening.   simvastatin 10 MG tablet Commonly known as: ZOCOR Take 1 tablet (10 mg total) by mouth daily.   SUMAtriptan 100 MG tablet Commonly known as: IMITREX Take 1 tablet (100 mg total) by mouth once as needed for migraine. May repeat x 1 after 2 hours; maximum 2 tabs per day and 8 tabs per month What changed: reasons to take this   tamsulosin 0.4 MG Caps capsule Commonly known as: FLOMAX Take 1 capsule (0.4 mg total) by mouth every evening.           Objective:   Physical Exam BP 131/83 (BP Location: Right Arm, Patient Position: Sitting, Cuff Size: Small)   Pulse 69   Temp 97.7 F (36.5 C) (Oral)   Resp 16   Ht 6' (1.829 m)   Wt 189 lb 8 oz (86 kg)   SpO2 99%   BMI 25.70 kg/m  General: Well developed, NAD, BMI noted Neck: No  thyromegaly  HEENT:  Normocephalic . Face symmetric, atraumatic Lungs:  CTA B Normal respiratory effort, no intercostal retractions, no accessory muscle use. Heart: RRR,  no murmur.  No pretibial edema bilaterally  Abdomen:  Not distended, soft, non-tender. No rebound or rigidity.   Skin: Exposed areas without rash. Not pale. Not jaundice DRE: Normal sphincter tone, brown stools, prostate slightly enlarged but not tender or nodular Neurologic:  alert & oriented X3.  Speech normal, gait appropriate for age and unassisted Strength symmetric and appropriate for age.  Psych: Cognition and judgment appear intact.  Cooperative with normal attention span and concentration.  Behavior appropriate. No anxious or depressed appearing.     Assessment      Assessment Hyperlipidemia , rx statins 2015 Insomnia BCC, nose 2017 Cold sores, prn Valtrex H/o cluster HAs 1 -- 2006. Had  sx again 2019, MRI 04/2018; topamax helped  but had s/e (fatigue, mental fog) Elevated  PSA 08-2015, UCX (-), s/p abx, saw urology, DRE benign except for mild enlargement, PSA decreased, dx w/  Sx of bladder outlet obstruction  PLAN: Hyperlipidemia: Checking labs Headaches: Since the last visit, had a brain  MRI with no acute findings, neurology concurred that he had cluster headaches. He was feeling fatigued and  mentally "fogged" he contact neurology because he felt it was a side effect from Topamax, medicine was weaned off and he is now back to normal.  No further headaches. He still have some Imitrex in case he has an acute headache. Hopefully, cluster headache will not come back. Enlarged Aorta: Per ultrasound 3 years ago, due to repeat, will arrange RTC 1 year, sooner depending on the results.

## 2018-10-08 NOTE — Patient Instructions (Signed)
GO TO THE LAB : Get the blood work     GO TO THE FRONT DESK Schedule your next appointment   for a physical exam in 1 year 

## 2018-10-08 NOTE — Progress Notes (Signed)
Pre visit review using our clinic review tool, if applicable. No additional management support is needed unless otherwise documented below in the visit note. 

## 2018-10-08 NOTE — Assessment & Plan Note (Addendum)
-  Td 2016 -pnm 23: 2016 -prevnar: 2017; zostavax : at age 73   -s/p shingrex #1 and #2 - rec flu shot  --CCS: cscope 2007, cscope 02-2016 , + polyps , next 2022 per GI letter  --Prostate cancer  Screening: DRE today is normal.  Check a PSA, it has been been elevated before, last  year it was 4.1.  He has minimal symptoms.    --Lifestyle discussed, he is doing very well.  Walks almost daily , ~5 miles. --Labs:    CMP, FLP, TSH, PSA --Still on aspirin

## 2018-10-08 NOTE — Assessment & Plan Note (Signed)
Hyperlipidemia: Checking labs Headaches: Since the last visit, had a brain  MRI with no acute findings, neurology concurred that he had cluster headaches. He was feeling fatigued and  mentally "fogged" he contact neurology because he felt it was a side effect from Topamax, medicine was weaned off and he is now back to normal.  No further headaches. He still have some Imitrex in case he has an acute headache. Hopefully, cluster headache will not come back. Enlarged Aorta: Per ultrasound 3 years ago, due to repeat, will arrange RTC 1 year, sooner depending on the results.

## 2018-10-11 NOTE — Addendum Note (Signed)
Addended byDamita Dunnings D on: 10/11/2018 04:23 PM   Modules accepted: Orders

## 2018-11-13 DIAGNOSIS — N400 Enlarged prostate without lower urinary tract symptoms: Secondary | ICD-10-CM | POA: Diagnosis not present

## 2018-11-13 DIAGNOSIS — R972 Elevated prostate specific antigen [PSA]: Secondary | ICD-10-CM | POA: Diagnosis not present

## 2018-12-29 DIAGNOSIS — R69 Illness, unspecified: Secondary | ICD-10-CM | POA: Diagnosis not present

## 2019-01-07 ENCOUNTER — Other Ambulatory Visit: Payer: Self-pay

## 2019-01-07 DIAGNOSIS — Z20822 Contact with and (suspected) exposure to covid-19: Secondary | ICD-10-CM

## 2019-01-07 DIAGNOSIS — Z20828 Contact with and (suspected) exposure to other viral communicable diseases: Secondary | ICD-10-CM | POA: Diagnosis not present

## 2019-01-09 LAB — NOVEL CORONAVIRUS, NAA: SARS-CoV-2, NAA: NOT DETECTED

## 2019-01-10 ENCOUNTER — Telehealth: Payer: Self-pay | Admitting: Internal Medicine

## 2019-01-10 MED ORDER — VALACYCLOVIR HCL 1 G PO TABS: ORAL_TABLET | ORAL | 3 refills | Status: DC

## 2019-01-10 NOTE — Telephone Encounter (Signed)
Pt need refill on valacyclovir 1 gram. Pt has a new pharm [facility] [address]

## 2019-01-10 NOTE — Telephone Encounter (Signed)
Use Valtrex as needed for cold sores, medication sent

## 2019-01-10 NOTE — Telephone Encounter (Signed)
Please advise. Med not on med list.

## 2019-02-05 DIAGNOSIS — R69 Illness, unspecified: Secondary | ICD-10-CM | POA: Diagnosis not present

## 2019-02-11 DIAGNOSIS — R972 Elevated prostate specific antigen [PSA]: Secondary | ICD-10-CM | POA: Diagnosis not present

## 2019-02-12 LAB — PSA: PSA: 5.12

## 2019-03-11 ENCOUNTER — Other Ambulatory Visit: Payer: Self-pay

## 2019-03-11 ENCOUNTER — Ambulatory Visit (HOSPITAL_BASED_OUTPATIENT_CLINIC_OR_DEPARTMENT_OTHER)
Admission: RE | Admit: 2019-03-11 | Discharge: 2019-03-11 | Disposition: A | Discharge status: Home / Self Care | Payer: Medicare HMO | Source: Ambulatory Visit | Attending: Internal Medicine | Admitting: Internal Medicine

## 2019-03-11 DIAGNOSIS — I7789 Other specified disorders of arteries and arterioles: Secondary | ICD-10-CM | POA: Diagnosis not present

## 2019-03-11 DIAGNOSIS — I77819 Aortic ectasia, unspecified site: Secondary | ICD-10-CM | POA: Diagnosis not present

## 2019-05-09 DIAGNOSIS — D1801 Hemangioma of skin and subcutaneous tissue: Secondary | ICD-10-CM | POA: Diagnosis not present

## 2019-05-09 DIAGNOSIS — L570 Actinic keratosis: Secondary | ICD-10-CM | POA: Diagnosis not present

## 2019-05-09 DIAGNOSIS — L812 Freckles: Secondary | ICD-10-CM | POA: Diagnosis not present

## 2019-05-09 DIAGNOSIS — D225 Melanocytic nevi of trunk: Secondary | ICD-10-CM | POA: Diagnosis not present

## 2019-05-09 DIAGNOSIS — L821 Other seborrheic keratosis: Secondary | ICD-10-CM | POA: Diagnosis not present

## 2019-05-09 DIAGNOSIS — L720 Epidermal cyst: Secondary | ICD-10-CM | POA: Diagnosis not present

## 2019-05-09 DIAGNOSIS — R972 Elevated prostate specific antigen [PSA]: Secondary | ICD-10-CM | POA: Diagnosis not present

## 2019-05-09 DIAGNOSIS — Z85828 Personal history of other malignant neoplasm of skin: Secondary | ICD-10-CM | POA: Diagnosis not present

## 2019-05-10 LAB — PSA: PSA: 4.65

## 2019-05-13 DIAGNOSIS — N400 Enlarged prostate without lower urinary tract symptoms: Secondary | ICD-10-CM | POA: Diagnosis not present

## 2019-05-13 DIAGNOSIS — R972 Elevated prostate specific antigen [PSA]: Secondary | ICD-10-CM | POA: Diagnosis not present

## 2019-05-20 ENCOUNTER — Encounter: Payer: Self-pay | Admitting: Internal Medicine

## 2019-05-27 ENCOUNTER — Ambulatory Visit: Payer: Medicare HMO | Attending: Family

## 2019-05-27 DIAGNOSIS — Z23 Encounter for immunization: Secondary | ICD-10-CM | POA: Insufficient documentation

## 2019-05-27 NOTE — Progress Notes (Signed)
   Covid-19 Vaccination Clinic  Name:  Eric Vang    MRN: [id number] DOB: 08-Jan-1946  05/27/2019  [name] was observed post Covid-19 immunization for 15 minutes without incidence. He was provided with Vaccine Information Sheet and instruction to access the V-Safe system.   [name] was instructed to call 911 with any severe reactions post vaccine: Marland Kitchen Difficulty breathing  . Swelling of your face and throat  . A fast heartbeat  . A bad rash all over your body  . Dizziness and weakness    Immunizations Administered    Name Date Dose VIS Date Route   Moderna COVID-19 Vaccine 05/27/2019 12:47 PM 0.5 mL 03/05/2019 Intramuscular   Manufacturer: Moderna   Lot: [id number]   [place]:[id number]

## 2019-06-25 ENCOUNTER — Ambulatory Visit: Payer: Medicare HMO | Attending: Family

## 2019-06-25 DIAGNOSIS — Z23 Encounter for immunization: Secondary | ICD-10-CM

## 2019-06-25 NOTE — Progress Notes (Signed)
   Covid-19 Vaccination Clinic  Name:  Eric Vang    MRN: [id number] DOB: 1945/05/04  06/25/2019  Eric Vang was observed post Covid-19 immunization for 15 minutes without incident. He was provided with Vaccine Information Sheet and instruction to access the V-Safe system.   Eric Vang was instructed to call 911 with any severe reactions post vaccine: Marland Kitchen Difficulty breathing  . Swelling of face and throat  . A fast heartbeat  . A bad rash all over body  . Dizziness and weakness   Immunizations Administered    Name Date Dose VIS Date Route   Moderna COVID-19 Vaccine 06/25/2019 12:59 PM 0.5 mL 03/05/2019 Intramuscular   Manufacturer: Moderna   LotHQ:[id number]   [place]:[id number]

## 2019-07-02 ENCOUNTER — Ambulatory Visit: Payer: Medicare HMO

## 2019-07-04 ENCOUNTER — Other Ambulatory Visit: Payer: Self-pay | Admitting: Internal Medicine

## 2019-08-15 DIAGNOSIS — R972 Elevated prostate specific antigen [PSA]: Secondary | ICD-10-CM | POA: Diagnosis not present

## 2019-08-15 LAB — PSA: PSA: 3.95

## 2019-09-04 DIAGNOSIS — Z85828 Personal history of other malignant neoplasm of skin: Secondary | ICD-10-CM | POA: Diagnosis not present

## 2019-09-04 DIAGNOSIS — L309 Dermatitis, unspecified: Secondary | ICD-10-CM | POA: Diagnosis not present

## 2019-09-04 DIAGNOSIS — L239 Allergic contact dermatitis, unspecified cause: Secondary | ICD-10-CM | POA: Diagnosis not present

## 2019-10-18 ENCOUNTER — Encounter: Payer: Medicare HMO | Admitting: Internal Medicine

## 2019-10-30 DIAGNOSIS — Z85828 Personal history of other malignant neoplasm of skin: Secondary | ICD-10-CM | POA: Diagnosis not present

## 2019-10-30 DIAGNOSIS — R030 Elevated blood-pressure reading, without diagnosis of hypertension: Secondary | ICD-10-CM | POA: Diagnosis not present

## 2019-10-30 DIAGNOSIS — Z7982 Long term (current) use of aspirin: Secondary | ICD-10-CM | POA: Diagnosis not present

## 2019-10-30 DIAGNOSIS — Z008 Encounter for other general examination: Secondary | ICD-10-CM | POA: Diagnosis not present

## 2019-10-30 DIAGNOSIS — Z809 Family history of malignant neoplasm, unspecified: Secondary | ICD-10-CM | POA: Diagnosis not present

## 2019-10-30 DIAGNOSIS — E785 Hyperlipidemia, unspecified: Secondary | ICD-10-CM | POA: Diagnosis not present

## 2019-10-30 DIAGNOSIS — N529 Male erectile dysfunction, unspecified: Secondary | ICD-10-CM | POA: Diagnosis not present

## 2019-10-30 DIAGNOSIS — Z833 Family history of diabetes mellitus: Secondary | ICD-10-CM | POA: Diagnosis not present

## 2019-10-30 DIAGNOSIS — N400 Enlarged prostate without lower urinary tract symptoms: Secondary | ICD-10-CM | POA: Diagnosis not present

## 2019-11-15 ENCOUNTER — Encounter: Payer: Self-pay | Admitting: Internal Medicine

## 2019-11-15 ENCOUNTER — Ambulatory Visit (INDEPENDENT_AMBULATORY_CARE_PROVIDER_SITE_OTHER): Payer: Medicare HMO | Admitting: Internal Medicine

## 2019-11-15 ENCOUNTER — Other Ambulatory Visit: Payer: Self-pay

## 2019-11-15 VITALS — BP 125/74 | HR 75 | Temp 98.4°F | Resp 16 | Ht 72.0 in | Wt 196.0 lb

## 2019-11-15 DIAGNOSIS — E785 Hyperlipidemia, unspecified: Secondary | ICD-10-CM | POA: Diagnosis not present

## 2019-11-15 DIAGNOSIS — Z0000 Encounter for general adult medical examination without abnormal findings: Secondary | ICD-10-CM

## 2019-11-15 LAB — CBC WITH DIFFERENTIAL/PLATELET
Basophils Absolute: 0 10*3/uL (ref 0.0–0.1)
Basophils Relative: 0.8 % (ref 0.0–3.0)
Eosinophils Absolute: 0.3 10*3/uL (ref 0.0–0.7)
Eosinophils Relative: 5.1 % — ABNORMAL HIGH (ref 0.0–5.0)
HCT: 44.6 % (ref 39.0–52.0)
Hemoglobin: 15.2 g/dL (ref 13.0–17.0)
Lymphocytes Relative: 27.2 % (ref 12.0–46.0)
Lymphs Abs: 1.4 10*3/uL (ref 0.7–4.0)
MCHC: 34 g/dL (ref 30.0–36.0)
MCV: 99.6 fl (ref 78.0–100.0)
Monocytes Absolute: 0.5 10*3/uL (ref 0.1–1.0)
Monocytes Relative: 9.8 % (ref 3.0–12.0)
Neutro Abs: 2.9 10*3/uL (ref 1.4–7.7)
Neutrophils Relative %: 57.1 % (ref 43.0–77.0)
Platelets: 256 10*3/uL (ref 150.0–400.0)
RBC: 4.48 Mil/uL (ref 4.22–5.81)
RDW: 13.3 % (ref 11.5–15.5)
WBC: 5 10*3/uL (ref 4.0–10.5)

## 2019-11-15 LAB — COMPREHENSIVE METABOLIC PANEL
ALT: 34 U/L (ref 0–53)
AST: 32 U/L (ref 0–37)
Albumin: 4.3 g/dL (ref 3.5–5.2)
Alkaline Phosphatase: 57 U/L (ref 39–117)
BUN: 26 mg/dL — ABNORMAL HIGH (ref 6–23)
CO2: 27 mEq/L (ref 19–32)
Calcium: 9.4 mg/dL (ref 8.4–10.5)
Chloride: 105 mEq/L (ref 96–112)
Creatinine, Ser: 0.98 mg/dL (ref 0.40–1.50)
GFR: 75.09 mL/min (ref >60.00)
Glucose, Bld: 86 mg/dL (ref 70–99)
Potassium: 4.1 mEq/L (ref 3.5–5.1)
Sodium: 140 mEq/L (ref 135–145)
Total Bilirubin: 1.1 mg/dL (ref 0.2–1.2)
Total Protein: 6.3 g/dL (ref 6.0–8.3)

## 2019-11-15 LAB — LIPID PANEL
Cholesterol: 193 mg/dL (ref 0–200)
HDL: 74.9 mg/dL (ref >39.00)
LDL Cholesterol: 101 mg/dL — ABNORMAL HIGH (ref 0–99)
NonHDL: 118.42
Total CHOL/HDL Ratio: 3
Triglycerides: 87 mg/dL (ref 0.0–149.0)
VLDL: 17.4 mg/dL (ref 0.0–40.0)

## 2019-11-15 LAB — PSA: PSA: 4.9

## 2019-11-15 MED ORDER — VALACYCLOVIR HCL 1 G PO TABS: ORAL_TABLET | ORAL | 3 refills | Status: DC

## 2019-11-15 NOTE — Progress Notes (Signed)
Subjective:    Patient ID: Eric Vang, male    DOB: 1945/06/22, 74 y.o.   MRN: [id number]  DOS:  11/15/2019 Type of visit - description: CPX Since the last office visit he is doing well and has no concerns   Review of Systems Occasional nocturia, otherwise no LUTS  Other than above, a 14 point review of systems is negative      Past Medical History:  Diagnosis Date  . Anemia   . Basal cell carcinoma of nose 2017   removed. a small area right eye  . CLUSTER HEADACHE 01/15/2007   2006, 2019  . Cluster headaches   . Coronary artery disease   . DIVERTICULOSIS, COLON 02/04/2008  . Elevated PSA    plan possible prostate Bx w/ urology  . Hyperlipidemia    started statin 2015   . Inguinal hernia    right  . Insomnia   . Scrotal varices   . Spermatocele of epididymis, single   . Unilateral inguinal hernia 2017    Past Surgical History:  Procedure Laterality Date  . CARDIAC CATHETERIZATION     > 20 years ago  . COLONOSCOPY      x 2 no polyps  . COLONOSCOPY W/ POLYPECTOMY  2016 ish  . INGUINAL HERNIA REPAIR Right 12/03/2015   Procedure: LAPAROSCOPIC RIGHT INGUINAL HERNIA REPAIR;  Surgeon: [name], MD;  Location: [place];  Service: General;  Laterality: Right;  . INSERTION OF MESH Right 12/03/2015   Procedure: INSERTION OF MESH;  Surgeon: [name], MD;  Location: [place];  Service: General;  Laterality: Right;  . MOHS SURGERY  2017   L nose   . RADIOLOGY WITH ANESTHESIA N/A 05/01/2018   Procedure: MRI WITH ANESTHESIA   BRAIN WITH AND WITHOUT;  Surgeon: Radiologist, Medication, MD;  Location: [place];  Service: Radiology;  Laterality: N/A;    Allergies as of 11/15/2019   No Known Allergies     Medication List       Accurate as of November 15, 2019 11:59 PM. If you have any questions, ask your nurse or doctor.        STOP taking these medications   aspirin EC 81 MG tablet Stopped by: Kathlene November, MD   SUMAtriptan 100 MG tablet Commonly known as:  IMITREX Stopped by: Kathlene November, MD     TAKE these medications   simvastatin 10 MG tablet Commonly known as: ZOCOR Take 1 tablet (10 mg total) by mouth daily.   tamsulosin 0.4 MG Caps capsule Commonly known as: FLOMAX Take 1 capsule (0.4 mg total) by mouth daily after supper.   valACYclovir 1000 MG tablet Commonly known as: VALTREX TAKE 1 TABLET BY MOUTH TWO (2) TIMES DAILY AS NEEDED FOR COLD SORES. FOR 5 DAYS PER EPISODE          Objective:   Physical Exam BP 125/74   Pulse 75   Temp 98.4 F (36.9 C) (Oral)   Resp 16   Ht 6' (1.829 m)   Wt 196 lb (88.9 kg)   SpO2 95%   BMI 26.58 kg/m  General: Well developed, NAD, BMI noted Neck: No  thyromegaly  HEENT:  Normocephalic . Face symmetric, atraumatic Lungs:  CTA B Normal respiratory effort, no intercostal retractions, no accessory muscle use. Heart: RRR,  no murmur.  Abdomen:  Not distended, soft, non-tender. No rebound or rigidity.   Lower extremities: no pretibial edema bilaterally  Skin: Exposed areas without rash. Not pale. Not  jaundice Neurologic:  alert & oriented X3.  Speech normal, gait appropriate for age and unassisted Strength symmetric and appropriate for age.  Psych: Cognition and judgment appear intact.  Cooperative with normal attention span and concentration.  Behavior appropriate. No anxious or depressed appearing.     Assessment      Assessment Hyperlipidemia , rx statins 2015 Insomnia BCC, nose 2017 Cold sores, prn Valtrex H/o cluster HAs 1 -- 2006. Had  sx again 2019, MRI 04/2018; topamax helped but had s/e (fatigue, mental fog) Elevated PSA 08-2015, UCX (-), s/p abx, saw urology, DRE benign except for mild enlargement, PSA decreased, dx w/  Sx of bladder outlet obstruction  PLAN: Here for CPX High cholesterol: Continue simvastatin, checking labs Cold sores: RF Valtrex Cluster headaches: Not an issue in many months. Enlarged aorta: Last ultrasound 03/11/2019, Ao was smaller,  consider ultrasound in 5 years Aspirin: Per literature okay to stop it RTC 1 year    This visit occurred during the SARS-CoV-2 public health emergency.  Safety protocols were in place, including screening questions prior to the visit, additional usage of staff PPE, and extensive cleaning of exam room while observing appropriate contact time as indicated for disinfecting solutions.

## 2019-11-15 NOTE — Patient Instructions (Addendum)
   GO TO THE LAB : Get the blood work     GO TO THE FRONT DESK, PLEASE SCHEDULE YOUR APPOINTMENTS Come back for a physical exam in 1 year 

## 2019-11-17 ENCOUNTER — Encounter: Payer: Self-pay | Admitting: Internal Medicine

## 2019-11-17 NOTE — Assessment & Plan Note (Signed)
Here for CPX High cholesterol: Continue simvastatin, checking labs Cold sores: RF Valtrex Cluster headaches: Not an issue in many months. Enlarged aorta: Last ultrasound 03/11/2019, Ao was smaller, consider ultrasound in 5 years Aspirin: Per literature okay to stop it RTC 1 year

## 2019-11-17 NOTE — Assessment & Plan Note (Signed)
-  Td 2016 -pnm 23: 2016 -prevnar: 2017 - zostavax : at age 74   -s/p shingrex -Had Covid vaccinations - rec flu shot q. year --CCS: cscope 2007, cscope 02-2016 , + polyps , next 2022 per GI letter  --Prostate cancer  Screening: DRE normal 10-2018, PSA was re-checked >> slightly elevated, now sees urology, last OV 05/13/2019 .  To have a PSA checked today @ urology --Lifestyle : cont to be very good  --Labs: CMP, FLP, CBC

## 2019-11-21 DIAGNOSIS — R972 Elevated prostate specific antigen [PSA]: Secondary | ICD-10-CM | POA: Diagnosis not present

## 2019-11-21 DIAGNOSIS — N400 Enlarged prostate without lower urinary tract symptoms: Secondary | ICD-10-CM | POA: Diagnosis not present

## 2019-11-27 ENCOUNTER — Encounter: Payer: Self-pay | Admitting: Internal Medicine

## 2020-01-18 DIAGNOSIS — R69 Illness, unspecified: Secondary | ICD-10-CM | POA: Diagnosis not present

## 2020-04-30 ENCOUNTER — Other Ambulatory Visit: Payer: Self-pay | Admitting: Internal Medicine

## 2020-05-07 DIAGNOSIS — R972 Elevated prostate specific antigen [PSA]: Secondary | ICD-10-CM | POA: Diagnosis not present

## 2020-05-07 LAB — PSA: PSA: 5.07

## 2020-05-14 DIAGNOSIS — L570 Actinic keratosis: Secondary | ICD-10-CM | POA: Diagnosis not present

## 2020-05-14 DIAGNOSIS — N400 Enlarged prostate without lower urinary tract symptoms: Secondary | ICD-10-CM | POA: Diagnosis not present

## 2020-05-14 DIAGNOSIS — Z85828 Personal history of other malignant neoplasm of skin: Secondary | ICD-10-CM | POA: Diagnosis not present

## 2020-05-14 DIAGNOSIS — L814 Other melanin hyperpigmentation: Secondary | ICD-10-CM | POA: Diagnosis not present

## 2020-05-14 DIAGNOSIS — D225 Melanocytic nevi of trunk: Secondary | ICD-10-CM | POA: Diagnosis not present

## 2020-05-14 DIAGNOSIS — D1801 Hemangioma of skin and subcutaneous tissue: Secondary | ICD-10-CM | POA: Diagnosis not present

## 2020-05-14 DIAGNOSIS — R972 Elevated prostate specific antigen [PSA]: Secondary | ICD-10-CM | POA: Diagnosis not present

## 2020-05-14 DIAGNOSIS — D2271 Melanocytic nevi of right lower limb, including hip: Secondary | ICD-10-CM | POA: Diagnosis not present

## 2020-05-14 DIAGNOSIS — N5201 Erectile dysfunction due to arterial insufficiency: Secondary | ICD-10-CM | POA: Diagnosis not present

## 2020-05-14 DIAGNOSIS — L821 Other seborrheic keratosis: Secondary | ICD-10-CM | POA: Diagnosis not present

## 2020-05-14 DIAGNOSIS — D2272 Melanocytic nevi of left lower limb, including hip: Secondary | ICD-10-CM | POA: Diagnosis not present

## 2020-05-14 DIAGNOSIS — D2371 Other benign neoplasm of skin of right lower limb, including hip: Secondary | ICD-10-CM | POA: Diagnosis not present

## 2020-05-14 DIAGNOSIS — L820 Inflamed seborrheic keratosis: Secondary | ICD-10-CM | POA: Diagnosis not present

## 2020-06-04 ENCOUNTER — Encounter: Payer: Self-pay | Admitting: Internal Medicine

## 2020-07-28 ENCOUNTER — Other Ambulatory Visit: Payer: Self-pay | Admitting: Internal Medicine

## 2020-11-02 DIAGNOSIS — R972 Elevated prostate specific antigen [PSA]: Secondary | ICD-10-CM | POA: Diagnosis not present

## 2020-11-02 LAB — PSA: PSA: 4.21

## 2020-11-09 DIAGNOSIS — R972 Elevated prostate specific antigen [PSA]: Secondary | ICD-10-CM | POA: Diagnosis not present

## 2020-11-09 DIAGNOSIS — N5201 Erectile dysfunction due to arterial insufficiency: Secondary | ICD-10-CM | POA: Diagnosis not present

## 2020-11-09 DIAGNOSIS — N400 Enlarged prostate without lower urinary tract symptoms: Secondary | ICD-10-CM | POA: Diagnosis not present

## 2020-11-16 ENCOUNTER — Encounter: Payer: Self-pay | Admitting: Internal Medicine

## 2020-11-18 ENCOUNTER — Other Ambulatory Visit: Payer: Self-pay

## 2020-11-19 ENCOUNTER — Encounter: Payer: Self-pay | Admitting: Internal Medicine

## 2020-11-19 ENCOUNTER — Ambulatory Visit (INDEPENDENT_AMBULATORY_CARE_PROVIDER_SITE_OTHER): Payer: Medicare HMO | Admitting: Internal Medicine

## 2020-11-19 VITALS — BP 118/74 | HR 71 | Temp 98.0°F | Resp 16 | Ht 73.0 in | Wt 193.5 lb

## 2020-11-19 DIAGNOSIS — E785 Hyperlipidemia, unspecified: Secondary | ICD-10-CM | POA: Diagnosis not present

## 2020-11-19 DIAGNOSIS — Z0000 Encounter for general adult medical examination without abnormal findings: Secondary | ICD-10-CM | POA: Diagnosis not present

## 2020-11-19 LAB — CBC WITH DIFFERENTIAL/PLATELET
Basophils Absolute: 0 10*3/uL (ref 0.0–0.1)
Basophils Relative: 0.8 % (ref 0.0–3.0)
Eosinophils Absolute: 0.2 10*3/uL (ref 0.0–0.7)
Eosinophils Relative: 4 % (ref 0.0–5.0)
HCT: 44.2 % (ref 39.0–52.0)
Hemoglobin: 15 g/dL (ref 13.0–17.0)
Lymphocytes Relative: 27.5 % (ref 12.0–46.0)
Lymphs Abs: 1 10*3/uL (ref 0.7–4.0)
MCHC: 33.9 g/dL (ref 30.0–36.0)
MCV: 97.6 fl (ref 78.0–100.0)
Monocytes Absolute: 0.4 10*3/uL (ref 0.1–1.0)
Monocytes Relative: 10.4 % (ref 3.0–12.0)
Neutro Abs: 2.2 10*3/uL (ref 1.4–7.7)
Neutrophils Relative %: 57.3 % (ref 43.0–77.0)
Platelets: 259 10*3/uL (ref 150.0–400.0)
RBC: 4.53 Mil/uL (ref 4.22–5.81)
RDW: 13.2 % (ref 11.5–15.5)
WBC: 3.8 10*3/uL — ABNORMAL LOW (ref 4.0–10.5)

## 2020-11-19 LAB — COMPREHENSIVE METABOLIC PANEL
ALT: 25 U/L (ref 0–53)
AST: 35 U/L (ref 0–37)
Albumin: 4.3 g/dL (ref 3.5–5.2)
Alkaline Phosphatase: 68 U/L (ref 39–117)
BUN: 14 mg/dL (ref 6–23)
CO2: 28 mEq/L (ref 19–32)
Calcium: 9.7 mg/dL (ref 8.4–10.5)
Chloride: 105 mEq/L (ref 96–112)
Creatinine, Ser: 0.91 mg/dL (ref 0.40–1.50)
GFR: 83.63 mL/min (ref >60.00)
Glucose, Bld: 86 mg/dL (ref 70–99)
Potassium: 4.5 mEq/L (ref 3.5–5.1)
Sodium: 141 mEq/L (ref 135–145)
Total Bilirubin: 1 mg/dL (ref 0.2–1.2)
Total Protein: 6.4 g/dL (ref 6.0–8.3)

## 2020-11-19 LAB — LIPID PANEL
Cholesterol: 160 mg/dL (ref 0–200)
HDL: 77 mg/dL (ref >39.00)
LDL Cholesterol: 65 mg/dL (ref 0–99)
NonHDL: 82.58
Total CHOL/HDL Ratio: 2
Triglycerides: 88 mg/dL (ref 0.0–149.0)
VLDL: 17.6 mg/dL (ref 0.0–40.0)

## 2020-11-19 LAB — TSH: TSH: 2.68 u[IU]/mL (ref 0.35–5.50)

## 2020-11-19 NOTE — Patient Instructions (Addendum)
You are due for your recall colonoscopy, please arrange that at your new location  We will miss you guys, good luck in [place]!   Check the  blood pressure regularly BP GOAL is between 110/65 and  135/85. If it is consistently higher or lower, let me know     GO TO THE LAB : Get the blood work

## 2020-11-19 NOTE — Progress Notes (Signed)
Subjective:    Patient ID: Eric Vang, male    DOB: 03-Feb-1946, 75 y.o.   MRN: [id number]  DOS:  11/19/2020 Type of visit - description: CPX  Has no major concerns. Remains active.    Review of Systems  A 14 point review of systems is negative    Past Medical History:  Diagnosis Date   Anemia    Basal cell carcinoma of nose 2017   removed. a small area right eye   BPH (benign prostatic hyperplasia)    CLUSTER HEADACHE 01/15/2007   2006, 2019   Cluster headaches    Coronary artery disease    DIVERTICULOSIS, COLON 02/04/2008   Elevated PSA    plan possible prostate Bx w/ urology   Erectile dysfunction    Hyperlipidemia    started statin 2015    Inguinal hernia    right   Insomnia    Scrotal varices    Spermatocele of epididymis, single    Unilateral inguinal hernia 2017    Past Surgical History:  Procedure Laterality Date   CARDIAC CATHETERIZATION     > 20 years ago   COLONOSCOPY      x 2 no polyps   COLONOSCOPY W/ POLYPECTOMY  2016 ish   INGUINAL HERNIA REPAIR Right 12/03/2015   Procedure: LAPAROSCOPIC RIGHT INGUINAL HERNIA REPAIR;  Surgeon: [name], MD;  Location: [place];  Service: General;  Laterality: Right;   INSERTION OF MESH Right 12/03/2015   Procedure: INSERTION OF MESH;  Surgeon: [name], MD;  Location: [place];  Service: General;  Laterality: Right;   MOHS SURGERY  2017   L nose    RADIOLOGY WITH ANESTHESIA N/A 05/01/2018   Procedure: MRI WITH ANESTHESIA   BRAIN WITH AND WITHOUT;  Surgeon: Radiologist, Medication, MD;  Location: [place];  Service: Radiology;  Laterality: N/A;   Social History   Socioeconomic History   Marital status: Married    Spouse name: Not on file   Number of children: 1   Years of education: Not on file   Highest education level: Not on file  Occupational History   Occupation: fully retired Scientist, water quality  Tobacco Use   Smoking status: Former    Years: 7.00    Types: Cigarettes   Smokeless tobacco: Never    Tobacco comments:    stopped in 1979, was a light smoker  Vaping Use   Vaping Use: Never used  Substance and Sexual Activity   Alcohol use: Not Currently    Alcohol/week: 0.0 standard drinks   Drug use: No   Sexual activity: Not on file  Other Topics Concern   Not on file  Social History Narrative   Moved from [place] .   Lives home with wife, terri. 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  Caffeine 4 cups daily.     Step daughter is a Therapist, sports   Sister in law [name]    Exercise- walks qd    Social Determinants of Health   Financial Resource Strain: Not on file  Food Insecurity: Not on file  Transportation Needs: Not on file  Physical Activity: Not on file  Stress: Not on file  Social Connections: Not on file  Intimate Partner Violence: Not on file    Allergies as of 11/19/2020   No Known Allergies      Medication List        Accurate as of November 19, 2020 11:59 PM.  If you have any questions, ask your nurse or doctor.          simvastatin 10 MG tablet Commonly known as: ZOCOR Take 1 tablet (10 mg total) by mouth daily.   tamsulosin 0.4 MG Caps capsule Commonly known as: FLOMAX Take 1 capsule (0.4 mg total) by mouth daily after supper.   valACYclovir 1000 MG tablet Commonly known as: VALTREX TAKE 1 TABLET BY MOUTH TWO (2) TIMES DAILY AS NEEDED FOR COLD SORES. FOR 5 DAYS PER EPISODE           Objective:   Physical Exam BP 118/74 (BP Location: Left Arm, Patient Position: Sitting, Cuff Size: Normal)   Pulse 71   Temp 98 F (36.7 C) (Oral)   Resp 16   Ht '6\' 1"'$  (1.854 m)   Wt 193 lb 8 oz (87.8 kg)   SpO2 97%   BMI 25.53 kg/m  General: Well developed, NAD, BMI noted Neck: No  thyromegaly  HEENT:  Normocephalic . Face symmetric, atraumatic Lungs:  CTA B Normal respiratory effort, no intercostal retractions, no accessory muscle use. Heart: RRR,  no murmur.  Abdomen:  Not distended, soft,  non-tender. No rebound or rigidity.   Lower extremities: no pretibial edema bilaterally  Skin: Exposed areas without rash. Not pale. Not jaundice Neurologic:  alert & oriented X3.  Speech normal, gait appropriate for age and unassisted Strength symmetric and appropriate for age.  Psych: Cognition and judgment appear intact.  Cooperative with normal attention span and concentration.  Behavior appropriate. No anxious or depressed appearing.     Assessment      Assessment Hyperlipidemia , rx statins 2015 Insomnia BCC, nose 2017 Cold sores, prn Valtrex H/o cluster HAs 1 -- 2006. Had  sx again 2019, MRI 04/2018; topamax helped but had s/e (fatigue, mental fog) Elevated PSA 08-2015, UCX (-), s/p abx, saw urology, DRE benign except for mild enlargement, PSA decreased, dx w/  Sx of bladder outlet obstruction  PLAN: Here for CPX Hyperlipidemia: On statins, check FLP.  Follows a healthy diet mostly avoiding excessive carbohydrates. BCC: Sees Derm regularly Social: Moving to [facility] in few days, to be close to his family RTC as needed    This visit occurred during the SARS-CoV-2 public health emergency.  Safety protocols were in place, including screening questions prior to the visit, additional usage of staff PPE, and extensive cleaning of exam room while observing appropriate contact time as indicated for disinfecting solutions.

## 2020-11-20 NOTE — Assessment & Plan Note (Signed)
Here for CPX Hyperlipidemia: On statins, check FLP.  Follows a healthy diet mostly avoiding excessive carbohydrates. BCC: Sees Derm regularly Social: Moving to [facility] in few days, to be close to his family RTC as needed

## 2020-11-20 NOTE — Assessment & Plan Note (Signed)
-  Td 2016 -pnm 23: 2016; prevnar: 2017 - zostavax : at age 75  ; s/p shingrex -covid vax x 4 - rec flu shot q. year --CCS: cscope 2007, cscope 02-2016 , + polyps , next 2022 , plans to proceed (once he is settls in his new location, moving to [facility] ) --Prostate cancer screening: sees  urology, PSA 4.2 (11-02-20 per [facility]) --Lifestyle : cont to be very good  --Labs: CMP, FLP, CBC, TSH -Has a POA

## 2020-12-22 DIAGNOSIS — D485 Neoplasm of uncertain behavior of skin: Secondary | ICD-10-CM | POA: Diagnosis not present

## 2020-12-22 DIAGNOSIS — N400 Enlarged prostate without lower urinary tract symptoms: Secondary | ICD-10-CM | POA: Diagnosis not present

## 2020-12-22 DIAGNOSIS — R319 Hematuria, unspecified: Secondary | ICD-10-CM | POA: Diagnosis not present

## 2020-12-22 DIAGNOSIS — R972 Elevated prostate specific antigen [PSA]: Secondary | ICD-10-CM | POA: Diagnosis not present

## 2020-12-22 DIAGNOSIS — R310 Gross hematuria: Secondary | ICD-10-CM | POA: Diagnosis not present

## 2020-12-22 DIAGNOSIS — E7800 Pure hypercholesterolemia, unspecified: Secondary | ICD-10-CM | POA: Diagnosis not present

## 2021-01-08 DIAGNOSIS — Z0000 Encounter for general adult medical examination without abnormal findings: Secondary | ICD-10-CM | POA: Diagnosis not present

## 2021-01-08 DIAGNOSIS — Z13220 Encounter for screening for lipoid disorders: Secondary | ICD-10-CM | POA: Diagnosis not present

## 2021-01-08 DIAGNOSIS — Z1389 Encounter for screening for other disorder: Secondary | ICD-10-CM | POA: Diagnosis not present

## 2021-01-08 DIAGNOSIS — Z1329 Encounter for screening for other suspected endocrine disorder: Secondary | ICD-10-CM | POA: Diagnosis not present

## 2021-01-08 DIAGNOSIS — Z125 Encounter for screening for malignant neoplasm of prostate: Secondary | ICD-10-CM | POA: Diagnosis not present

## 2021-01-15 DIAGNOSIS — N3289 Other specified disorders of bladder: Secondary | ICD-10-CM | POA: Diagnosis not present

## 2021-01-20 ENCOUNTER — Other Ambulatory Visit: Payer: Self-pay | Admitting: Internal Medicine

## 2021-01-21 ENCOUNTER — Other Ambulatory Visit: Payer: Self-pay | Admitting: Internal Medicine

## 2021-01-25 ENCOUNTER — Other Ambulatory Visit: Payer: Self-pay | Admitting: Internal Medicine

## 2021-01-25 DIAGNOSIS — R310 Gross hematuria: Secondary | ICD-10-CM | POA: Diagnosis not present

## 2021-01-25 DIAGNOSIS — R9341 Abnormal radiologic findings on diagnostic imaging of renal pelvis, ureter, or bladder: Secondary | ICD-10-CM | POA: Diagnosis not present

## 2021-01-25 DIAGNOSIS — N401 Enlarged prostate with lower urinary tract symptoms: Secondary | ICD-10-CM | POA: Diagnosis not present

## 2021-01-28 DIAGNOSIS — Z86010 Personal history of colonic polyps: Secondary | ICD-10-CM | POA: Diagnosis not present

## 2021-01-28 DIAGNOSIS — C679 Malignant neoplasm of bladder, unspecified: Secondary | ICD-10-CM | POA: Diagnosis not present

## 2021-01-28 DIAGNOSIS — Z1211 Encounter for screening for malignant neoplasm of colon: Secondary | ICD-10-CM | POA: Diagnosis not present

## 2021-01-28 DIAGNOSIS — R310 Gross hematuria: Secondary | ICD-10-CM | POA: Diagnosis not present

## 2021-01-28 LAB — COMPREHENSIVE METABOLIC PANEL
Calcium: 8.3 — AB (ref 8.7–10.7)
GFR calc non Af Amer: 86

## 2021-01-28 LAB — BASIC METABOLIC PANEL
BUN: 22 — AB (ref 4–21)
CO2: 24 — AB (ref 13–22)
Chloride: 112 — AB (ref 99–108)
Creatinine: 0.9 (ref 0.6–1.3)
Glucose: 86
Potassium: 3.7 (ref 3.4–5.3)
Sodium: 140 (ref 137–147)

## 2021-01-28 LAB — CBC: RBC: 4.38 (ref 3.87–5.11)

## 2021-01-28 LAB — PROTIME-INR: Protime: 10.9 (ref 10.0–13.8)

## 2021-01-28 LAB — CBC AND DIFFERENTIAL
HCT: 44 (ref 41–53)
Hemoglobin: 14.4 (ref 13.5–17.5)
Neutrophils Absolute: 2.5
Platelets: 285 (ref 150–399)
WBC: 4.5

## 2021-01-28 LAB — POCT INR: INR: 1 (ref 0.9–1.1)

## 2021-01-29 ENCOUNTER — Encounter: Payer: Self-pay | Admitting: Internal Medicine

## 2021-01-29 ENCOUNTER — Telehealth: Payer: Self-pay | Admitting: Internal Medicine

## 2021-01-29 DIAGNOSIS — C678 Malignant neoplasm of overlapping sites of bladder: Secondary | ICD-10-CM | POA: Diagnosis not present

## 2021-01-29 DIAGNOSIS — D494 Neoplasm of unspecified behavior of bladder: Secondary | ICD-10-CM | POA: Diagnosis not present

## 2021-01-29 DIAGNOSIS — R310 Gross hematuria: Secondary | ICD-10-CM | POA: Diagnosis not present

## 2021-01-29 DIAGNOSIS — C679 Malignant neoplasm of bladder, unspecified: Secondary | ICD-10-CM | POA: Diagnosis not present

## 2021-01-29 DIAGNOSIS — C670 Malignant neoplasm of trigone of bladder: Secondary | ICD-10-CM | POA: Diagnosis not present

## 2021-01-29 DIAGNOSIS — Z720 Tobacco use: Secondary | ICD-10-CM | POA: Diagnosis not present

## 2021-01-29 NOTE — Telephone Encounter (Signed)
See my chart message

## 2021-01-29 NOTE — Telephone Encounter (Signed)
Pt. Called in and stated he would like for someone to return his call regarding an EKG that was done in office. He is scheduled to have a procedure to determine if he has bladder cancer and they are wanting to know about the EKG that was had when he saw dr Larose Kells. They stated they tried to call medical records but no one has returned call.  [phone]

## 2021-01-29 NOTE — Telephone Encounter (Signed)
Last EKG 05/01/2018, normal.  Was ordered by another provider.  Okay to fax him a copy if so desired.

## 2021-01-30 DIAGNOSIS — Z720 Tobacco use: Secondary | ICD-10-CM | POA: Diagnosis not present

## 2021-01-30 DIAGNOSIS — C670 Malignant neoplasm of trigone of bladder: Secondary | ICD-10-CM | POA: Diagnosis not present

## 2021-01-30 DIAGNOSIS — C678 Malignant neoplasm of overlapping sites of bladder: Secondary | ICD-10-CM | POA: Diagnosis not present

## 2021-01-30 DIAGNOSIS — R310 Gross hematuria: Secondary | ICD-10-CM | POA: Diagnosis not present

## 2021-01-30 DIAGNOSIS — C679 Malignant neoplasm of bladder, unspecified: Secondary | ICD-10-CM | POA: Diagnosis not present

## 2021-02-01 ENCOUNTER — Encounter: Payer: Self-pay | Admitting: Internal Medicine

## 2021-02-03 DIAGNOSIS — Z87891 Personal history of nicotine dependence: Secondary | ICD-10-CM | POA: Diagnosis not present

## 2021-02-03 DIAGNOSIS — R310 Gross hematuria: Secondary | ICD-10-CM | POA: Diagnosis not present

## 2021-02-03 DIAGNOSIS — C671 Malignant neoplasm of dome of bladder: Secondary | ICD-10-CM | POA: Diagnosis not present

## 2021-02-03 DIAGNOSIS — N401 Enlarged prostate with lower urinary tract symptoms: Secondary | ICD-10-CM | POA: Diagnosis not present

## 2021-02-10 DIAGNOSIS — N401 Enlarged prostate with lower urinary tract symptoms: Secondary | ICD-10-CM | POA: Diagnosis not present

## 2021-02-10 DIAGNOSIS — Z87891 Personal history of nicotine dependence: Secondary | ICD-10-CM | POA: Diagnosis not present

## 2021-02-10 DIAGNOSIS — R310 Gross hematuria: Secondary | ICD-10-CM | POA: Diagnosis not present

## 2021-02-10 DIAGNOSIS — C671 Malignant neoplasm of dome of bladder: Secondary | ICD-10-CM | POA: Diagnosis not present

## 2021-02-18 DIAGNOSIS — C672 Malignant neoplasm of lateral wall of bladder: Secondary | ICD-10-CM | POA: Diagnosis not present

## 2021-03-04 ENCOUNTER — Ambulatory Visit (INDEPENDENT_AMBULATORY_CARE_PROVIDER_SITE_OTHER): Payer: Medicare HMO | Admitting: Internal Medicine

## 2021-03-04 ENCOUNTER — Encounter: Payer: Self-pay | Admitting: Internal Medicine

## 2021-03-04 ENCOUNTER — Ambulatory Visit: Payer: Medicare HMO | Admitting: Internal Medicine

## 2021-03-04 VITALS — BP 116/76 | HR 76 | Temp 97.8°F | Resp 16 | Ht 73.0 in | Wt 200.4 lb

## 2021-03-04 DIAGNOSIS — Z8551 Personal history of malignant neoplasm of bladder: Secondary | ICD-10-CM | POA: Insufficient documentation

## 2021-03-04 DIAGNOSIS — R9431 Abnormal electrocardiogram [ECG] [EKG]: Secondary | ICD-10-CM

## 2021-03-04 DIAGNOSIS — R972 Elevated prostate specific antigen [PSA]: Secondary | ICD-10-CM | POA: Diagnosis not present

## 2021-03-04 DIAGNOSIS — C679 Malignant neoplasm of bladder, unspecified: Secondary | ICD-10-CM

## 2021-03-04 DIAGNOSIS — N400 Enlarged prostate without lower urinary tract symptoms: Secondary | ICD-10-CM | POA: Diagnosis not present

## 2021-03-04 DIAGNOSIS — C678 Malignant neoplasm of overlapping sites of bladder: Secondary | ICD-10-CM | POA: Diagnosis not present

## 2021-03-04 NOTE — Assessment & Plan Note (Signed)
Bladder cancer Patient was last seen 11/19/2020, at the time he was moving to [facility]. While there, had an episode of gross hematuria, that led to a CT that showed a bladder mass, subsequently had a cystoscopy and he was Dx w/ bladder cancer so he decided to move back to [place]. PET scan was done in [place] >>>  no mets. He already talked with his local urologist Dr. Milford Cage, the patient elected to be seen at [facility] and has an appointment with oncology and surgery next week. Emotionally he is doing okay. Abnormal EKG: In the process of the hematuria eval he had a EKG: Copy is poor quality but was told it was abnormal. EKG repeated today: NSR, nonspecific T wave inversion, unchanged from previous EKG.  He has no symptoms.  Offered to cardiology eval but again we agreed that he will see his doctors at [facility] if they request further eval we will proceed. RTC 6 months for recheck

## 2021-03-04 NOTE — Progress Notes (Signed)
Subjective:    Patient ID: Eric Vang, male    DOB: [birth year], 75 y.o.   MRN: [id number]  DOS:  03/04/2021 Type of visit - description: Acute, here with his wife  The patient recently moved to [facility]. While he was there had an episode of gross hematuria. Subsequently saw a internist, CT was done, showing bladder mass. Went to see a local urologist, biopsy showed a high-grade urothelial carcinoma.  He already has plans for treatment.  See assessment and plan.  In the process, they did an EKG and it was abnormal. The patient denies chest pain or difficulty breathing.  No palpitations.  Records for my review: 01/31/2021: CBC essentially normal.  BMP normal.  Review of Systems See above   Past Medical History:  Diagnosis Date   Anemia    Basal cell carcinoma of nose 2017   removed. a small area right eye   Bladder cancer ([place])    BPH (benign prostatic hyperplasia)    CLUSTER HEADACHE 01/15/2007   2006, 2019   Cluster headaches    Coronary artery disease    DIVERTICULOSIS, COLON 02/04/2008   Elevated PSA    plan possible prostate Bx w/ urology   Erectile dysfunction    Hyperlipidemia    started statin 2015    Inguinal hernia    right   Insomnia    Scrotal varices    Spermatocele of epididymis, single    Unilateral inguinal hernia 2017    Past Surgical History:  Procedure Laterality Date   CARDIAC CATHETERIZATION     > 20 years ago   COLONOSCOPY      x 2 no polyps   COLONOSCOPY W/ POLYPECTOMY  2016 ish   INGUINAL HERNIA REPAIR Right 12/03/2015   Procedure: LAPAROSCOPIC RIGHT INGUINAL HERNIA REPAIR;  Surgeon: [name], MD;  Location: [place];  Service: General;  Laterality: Right;   INSERTION OF MESH Right 12/03/2015   Procedure: INSERTION OF MESH;  Surgeon: [name], MD;  Location: [place];  Service: General;  Laterality: Right;   MOHS SURGERY  2017   L nose    RADIOLOGY WITH ANESTHESIA N/A 05/01/2018   Procedure: MRI WITH ANESTHESIA    BRAIN WITH AND WITHOUT;  Surgeon: Radiologist, Medication, MD;  Location: [place];  Service: Radiology;  Laterality: N/A;    Allergies as of 03/04/2021   No Known Allergies      Medication List        Accurate as of March 04, 2021  2:40 PM. If you have any questions, ask your nurse or doctor.          simvastatin 10 MG tablet Commonly known as: ZOCOR Take 1 tablet (10 mg total) by mouth daily.   tamsulosin 0.4 MG Caps capsule Commonly known as: FLOMAX Take 1 capsule (0.4 mg total) by mouth daily after supper.   valACYclovir 1000 MG tablet Commonly known as: VALTREX TAKE 1 TABLET BY MOUTH TWO (2) TIMES DAILY AS NEEDED FOR COLD SORES. FOR 5 DAYS PER EPISODE           Objective:   Physical Exam BP 116/76 (BP Location: Left Arm, Patient Position: Sitting, Cuff Size: Normal)   Pulse 76   Temp 97.8 F (36.6 C) (Oral)   Resp 16   Ht 6\' 1"  (1.854 m)   Wt 200 lb 6 oz (90.9 kg)   SpO2 96%   BMI 26.44 kg/m  General:   Well developed, NAD, BMI noted. HEENT:  Normocephalic . Face symmetric, atraumatic Lungs:  CTA B Normal respiratory effort, no intercostal retractions, no accessory muscle use. Heart: RRR,  no murmur.  Lower extremities: no pretibial edema bilaterally.  Calves symmetric, nontender. Skin: Not pale. Not jaundice Neurologic:  alert & oriented X3.  Speech normal, gait appropriate for age and unassisted Psych--  Cognition and judgment appear intact.  Cooperative with normal attention span and concentration.  Behavior appropriate. No anxious or depressed appearing.      Assessment     Assessment Hyperlipidemia , rx statins 2015 Insomnia BCC, nose 2017 Cold sores, prn Valtrex H/o cluster HAs 1 -- 2006. Had  sx again 2019, MRI 04/2018; topamax helped but had s/e (fatigue, mental fog) Elevated PSA 08-2015, UCX (-), s/p abx, saw urology, DRE benign except for mild enlargement, PSA decreased, dx w/  Sx of bladder outlet obstruction  PLAN: Bladder  cancer Patient was last seen 11/19/2020, at the time he was moving to [facility]. While there, had an episode of gross hematuria, that led to a CT that showed a bladder mass, subsequently had a cystoscopy and he was Dx w/ bladder cancer so he decided to move back to [place]. PET scan was done in [place] >>>  no mets. He already talked with his local urologist Dr. Milford Cage, the patient elected to be seen at [facility] and has an appointment with oncology and surgery next week. Emotionally he is doing okay. Abnormal EKG: In the process of the hematuria eval he had a EKG: Copy is poor quality but was told it was abnormal. EKG repeated today: NSR, nonspecific T wave inversion, unchanged from previous EKG.  He has no symptoms.  Offered to cardiology eval but again we agreed that he will see his doctors at [facility] if they request further eval we will proceed. RTC 6 months for recheck  Time spent 30 minutes, more than 50% counseling about abnormal EKG, giving him my opinion about further evaluation.  I also review records from his urologist in [place].  This visit occurred during the SARS-CoV-2 public health emergency.  Safety protocols were in place, including screening questions prior to the visit, additional usage of staff PPE, and extensive cleaning of exam room while observing appropriate contact time as indicated for disinfecting solutions.

## 2021-03-04 NOTE — Patient Instructions (Signed)
   GO TO THE FRONT DESK, PLEASE SCHEDULE YOUR APPOINTMENTS Come back for  a check up in 6 m

## 2021-03-05 ENCOUNTER — Encounter: Payer: Self-pay | Admitting: Internal Medicine

## 2021-03-08 DIAGNOSIS — C678 Malignant neoplasm of overlapping sites of bladder: Secondary | ICD-10-CM | POA: Diagnosis not present

## 2021-03-09 ENCOUNTER — Encounter: Payer: Self-pay | Admitting: Internal Medicine

## 2021-03-10 DIAGNOSIS — Z8049 Family history of malignant neoplasm of other genital organs: Secondary | ICD-10-CM | POA: Diagnosis not present

## 2021-03-10 DIAGNOSIS — C678 Malignant neoplasm of overlapping sites of bladder: Secondary | ICD-10-CM | POA: Diagnosis not present

## 2021-03-25 DIAGNOSIS — N323 Diverticulum of bladder: Secondary | ICD-10-CM | POA: Diagnosis not present

## 2021-03-25 DIAGNOSIS — N400 Enlarged prostate without lower urinary tract symptoms: Secondary | ICD-10-CM | POA: Diagnosis not present

## 2021-03-25 DIAGNOSIS — Z8719 Personal history of other diseases of the digestive system: Secondary | ICD-10-CM | POA: Diagnosis not present

## 2021-03-25 DIAGNOSIS — Z9889 Other specified postprocedural states: Secondary | ICD-10-CM | POA: Diagnosis not present

## 2021-03-25 DIAGNOSIS — Z79899 Other long term (current) drug therapy: Secondary | ICD-10-CM | POA: Diagnosis not present

## 2021-03-25 DIAGNOSIS — E7800 Pure hypercholesterolemia, unspecified: Secondary | ICD-10-CM | POA: Diagnosis not present

## 2021-03-25 DIAGNOSIS — C678 Malignant neoplasm of overlapping sites of bladder: Secondary | ICD-10-CM | POA: Diagnosis not present

## 2021-03-25 DIAGNOSIS — C679 Malignant neoplasm of bladder, unspecified: Secondary | ICD-10-CM | POA: Diagnosis not present

## 2021-03-25 DIAGNOSIS — E785 Hyperlipidemia, unspecified: Secondary | ICD-10-CM | POA: Diagnosis not present

## 2021-03-25 DIAGNOSIS — Z87891 Personal history of nicotine dependence: Secondary | ICD-10-CM | POA: Diagnosis not present

## 2021-03-25 DIAGNOSIS — Z85828 Personal history of other malignant neoplasm of skin: Secondary | ICD-10-CM | POA: Diagnosis not present

## 2021-03-30 ENCOUNTER — Encounter (HOSPITAL_COMMUNITY): Payer: Self-pay | Admitting: Emergency Medicine

## 2021-03-30 ENCOUNTER — Other Ambulatory Visit: Payer: Self-pay

## 2021-03-30 ENCOUNTER — Emergency Department (HOSPITAL_COMMUNITY)
Admission: EM | Admit: 2021-03-30 | Discharge: 2021-03-31 | Discharge status: Against Medical Advice | Payer: Medicare HMO | Attending: Student | Admitting: Student

## 2021-03-30 ENCOUNTER — Ambulatory Visit (HOSPITAL_COMMUNITY): Admission: EM | Admit: 2021-03-30 | Discharge: 2021-03-30 | Discharge status: Absent without leave | Payer: Medicare HMO

## 2021-03-30 DIAGNOSIS — R350 Frequency of micturition: Secondary | ICD-10-CM | POA: Diagnosis not present

## 2021-03-30 DIAGNOSIS — R6883 Chills (without fever): Secondary | ICD-10-CM | POA: Insufficient documentation

## 2021-03-30 DIAGNOSIS — Z5321 Procedure and treatment not carried out due to patient leaving prior to being seen by health care provider: Secondary | ICD-10-CM | POA: Diagnosis not present

## 2021-03-30 DIAGNOSIS — R109 Unspecified abdominal pain: Secondary | ICD-10-CM | POA: Insufficient documentation

## 2021-03-30 DIAGNOSIS — R300 Dysuria: Secondary | ICD-10-CM | POA: Insufficient documentation

## 2021-03-30 LAB — COMPREHENSIVE METABOLIC PANEL
ALT: 24 U/L (ref 0–44)
AST: 27 U/L (ref 15–41)
Albumin: 3.6 g/dL (ref 3.5–5.0)
Alkaline Phosphatase: 71 U/L (ref 38–126)
Anion gap: 10 (ref 5–15)
BUN: 16 mg/dL (ref 8–23)
CO2: 21 mmol/L — ABNORMAL LOW (ref 22–32)
Calcium: 8.8 mg/dL — ABNORMAL LOW (ref 8.9–10.3)
Chloride: 104 mmol/L (ref 98–111)
Creatinine, Ser: 1.08 mg/dL (ref 0.61–1.24)
GFR, Estimated: >60 mL/min (ref >60)
Glucose, Bld: 118 mg/dL — ABNORMAL HIGH (ref 70–99)
Potassium: 3.8 mmol/L (ref 3.5–5.1)
Sodium: 135 mmol/L (ref 135–145)
Total Bilirubin: 1 mg/dL (ref 0.3–1.2)
Total Protein: 6.2 g/dL — ABNORMAL LOW (ref 6.5–8.1)

## 2021-03-30 LAB — CBC WITH DIFFERENTIAL/PLATELET
Abs Immature Granulocytes: 0.09 10*3/uL — ABNORMAL HIGH (ref 0.00–0.07)
Basophils Absolute: 0 10*3/uL (ref 0.0–0.1)
Basophils Relative: 0 %
Eosinophils Absolute: 0.2 10*3/uL (ref 0.0–0.5)
Eosinophils Relative: 3 %
HCT: 42.1 % (ref 39.0–52.0)
Hemoglobin: 14.5 g/dL (ref 13.0–17.0)
Immature Granulocytes: 1 %
Lymphocytes Relative: 4 %
Lymphs Abs: 0.3 10*3/uL — ABNORMAL LOW (ref 0.7–4.0)
MCH: 34.2 pg — ABNORMAL HIGH (ref 26.0–34.0)
MCHC: 34.4 g/dL (ref 30.0–36.0)
MCV: 99.3 fL (ref 80.0–100.0)
Monocytes Absolute: 0.4 10*3/uL (ref 0.1–1.0)
Monocytes Relative: 4 %
Neutro Abs: 7.2 10*3/uL (ref 1.7–7.7)
Neutrophils Relative %: 88 %
Platelets: 241 10*3/uL (ref 150–400)
RBC: 4.24 MIL/uL (ref 4.22–5.81)
RDW: 12.4 % (ref 11.5–15.5)
WBC: 8.3 10*3/uL (ref 4.0–10.5)
nRBC: 0 % (ref 0.0–0.2)

## 2021-03-30 LAB — URINALYSIS, ROUTINE W REFLEX MICROSCOPIC
RBC / HPF: >50 RBC/hpf — ABNORMAL HIGH (ref 0–5)
WBC, UA: >50 WBC/hpf — ABNORMAL HIGH (ref 0–5)

## 2021-03-30 LAB — LACTIC ACID, PLASMA: Lactic Acid, Venous: 1.8 mmol/L (ref 0.5–1.9)

## 2021-03-30 MED ORDER — ACETAMINOPHEN 325 MG PO TABS
650.0000 mg | ORAL_TABLET | Freq: Once | ORAL | Status: AC
  Administered 2021-03-30: 650 mg via ORAL
  Filled 2021-03-30: qty 2

## 2021-03-30 NOTE — ED Triage Notes (Signed)
Patient reports urinary urgency/frequency , burning sensation with chills today after catheter removal this morning, history of bladder cancer and tumor resection at [facility] last 03/25/21.

## 2021-03-30 NOTE — ED Provider Notes (Signed)
Emergency Medicine Provider Triage Evaluation Note  Eric Vang , a 75 y.o. male  was evaluated in triage.  Pt complains of dysuria, decreased urine output, and abdominal pain and chills since today.  Recently underwent transurethral resection of bladder tumor on 12/292/2022 at [facility].  Foley catheter was removed today.  Review of Systems  Positive: Dysuria, hematuria, decreased urinary output, chills  Negative: Fevers, nausea, vomiting  Physical Exam  BP 138/83 (BP Location: Left Arm)    Pulse (!) 101    Temp 98.6 F (37 C)    Resp 20    Ht 6\' 1"  (1.854 m)    Wt 100 kg    SpO2 94%    BMI 29.09 kg/m  Gen:   Awake, no distress   Resp:  Normal effort  MSK:   Moves extremities without difficulty  Other:  Abdomen soft nondistended, nontender.  Medical Decision Making  Medically screening exam initiated at 10:20 PM.  Appropriate orders placed.  Eric Vang was informed that the remainder of the evaluation will be completed by another provider, this initial triage assessment does not replace that evaluation, and the importance of remaining in the ED until their evaluation is complete.  This chart was dictated using voice recognition software, Dragon. Despite the best efforts of this provider to proofread and correct errors, errors may still occur which can change documentation meaning.    Emeline Darling, PA-C 03/30/21 [id number]    Lorelle Gibbs, DO 03/30/21 2352

## 2021-03-31 LAB — URINE CULTURE

## 2021-03-31 NOTE — ED Notes (Signed)
Wife stated the wait time was to long Called patient will see doctor at [facility].

## 2021-03-31 NOTE — ED Notes (Signed)
Pt states that they are leaving

## 2021-04-01 DIAGNOSIS — R3915 Urgency of urination: Secondary | ICD-10-CM | POA: Diagnosis not present

## 2021-04-02 ENCOUNTER — Encounter (HOSPITAL_BASED_OUTPATIENT_CLINIC_OR_DEPARTMENT_OTHER): Payer: Self-pay | Admitting: Emergency Medicine

## 2021-04-02 ENCOUNTER — Emergency Department (HOSPITAL_BASED_OUTPATIENT_CLINIC_OR_DEPARTMENT_OTHER): Payer: Medicare HMO

## 2021-04-02 ENCOUNTER — Inpatient Hospital Stay (HOSPITAL_BASED_OUTPATIENT_CLINIC_OR_DEPARTMENT_OTHER)
Admission: EM | Admit: 2021-04-02 | Discharge: 2021-04-06 | DRG: 388 | Disposition: A | Discharge status: Home / Self Care | Payer: Medicare HMO | Attending: Family Medicine | Admitting: Family Medicine

## 2021-04-02 ENCOUNTER — Other Ambulatory Visit: Payer: Self-pay

## 2021-04-02 DIAGNOSIS — N390 Urinary tract infection, site not specified: Secondary | ICD-10-CM | POA: Diagnosis present

## 2021-04-02 DIAGNOSIS — E785 Hyperlipidemia, unspecified: Secondary | ICD-10-CM | POA: Diagnosis present

## 2021-04-02 DIAGNOSIS — G4700 Insomnia, unspecified: Secondary | ICD-10-CM | POA: Diagnosis present

## 2021-04-02 DIAGNOSIS — U071 COVID-19: Secondary | ICD-10-CM | POA: Diagnosis not present

## 2021-04-02 DIAGNOSIS — C678 Malignant neoplasm of overlapping sites of bladder: Secondary | ICD-10-CM | POA: Diagnosis not present

## 2021-04-02 DIAGNOSIS — K56609 Unspecified intestinal obstruction, unspecified as to partial versus complete obstruction: Secondary | ICD-10-CM

## 2021-04-02 DIAGNOSIS — K56600 Partial intestinal obstruction, unspecified as to cause: Principal | ICD-10-CM | POA: Diagnosis present

## 2021-04-02 DIAGNOSIS — J9811 Atelectasis: Secondary | ICD-10-CM | POA: Diagnosis not present

## 2021-04-02 DIAGNOSIS — K567 Ileus, unspecified: Secondary | ICD-10-CM | POA: Diagnosis present

## 2021-04-02 DIAGNOSIS — Z87891 Personal history of nicotine dependence: Secondary | ICD-10-CM

## 2021-04-02 DIAGNOSIS — N138 Other obstructive and reflux uropathy: Secondary | ICD-10-CM | POA: Diagnosis present

## 2021-04-02 DIAGNOSIS — Z8551 Personal history of malignant neoplasm of bladder: Secondary | ICD-10-CM | POA: Diagnosis present

## 2021-04-02 DIAGNOSIS — C679 Malignant neoplasm of bladder, unspecified: Secondary | ICD-10-CM | POA: Diagnosis present

## 2021-04-02 DIAGNOSIS — R932 Abnormal findings on diagnostic imaging of liver and biliary tract: Secondary | ICD-10-CM

## 2021-04-02 DIAGNOSIS — R319 Hematuria, unspecified: Secondary | ICD-10-CM | POA: Diagnosis present

## 2021-04-02 DIAGNOSIS — Z79899 Other long term (current) drug therapy: Secondary | ICD-10-CM

## 2021-04-02 DIAGNOSIS — K769 Liver disease, unspecified: Secondary | ICD-10-CM | POA: Diagnosis present

## 2021-04-02 DIAGNOSIS — Z85828 Personal history of other malignant neoplasm of skin: Secondary | ICD-10-CM

## 2021-04-02 DIAGNOSIS — K219 Gastro-esophageal reflux disease without esophagitis: Secondary | ICD-10-CM | POA: Diagnosis present

## 2021-04-02 DIAGNOSIS — N401 Enlarged prostate with lower urinary tract symptoms: Secondary | ICD-10-CM | POA: Diagnosis present

## 2021-04-02 DIAGNOSIS — R109 Unspecified abdominal pain: Secondary | ICD-10-CM | POA: Diagnosis not present

## 2021-04-02 DIAGNOSIS — I2510 Atherosclerotic heart disease of native coronary artery without angina pectoris: Secondary | ICD-10-CM | POA: Diagnosis present

## 2021-04-02 LAB — CBC WITH DIFFERENTIAL/PLATELET
Abs Immature Granulocytes: 0.03 10*3/uL (ref 0.00–0.07)
Basophils Absolute: 0 10*3/uL (ref 0.0–0.1)
Basophils Relative: 0 %
Eosinophils Absolute: 0 10*3/uL (ref 0.0–0.5)
Eosinophils Relative: 0 %
HCT: 42.7 % (ref 39.0–52.0)
Hemoglobin: 14.6 g/dL (ref 13.0–17.0)
Immature Granulocytes: 0 %
Lymphocytes Relative: 4 %
Lymphs Abs: 0.4 10*3/uL — ABNORMAL LOW (ref 0.7–4.0)
MCH: 33.6 pg (ref 26.0–34.0)
MCHC: 34.2 g/dL (ref 30.0–36.0)
MCV: 98.4 fL (ref 80.0–100.0)
Monocytes Absolute: 0.7 10*3/uL (ref 0.1–1.0)
Monocytes Relative: 7 %
Neutro Abs: 8.6 10*3/uL — ABNORMAL HIGH (ref 1.7–7.7)
Neutrophils Relative %: 89 %
Platelets: 306 10*3/uL (ref 150–400)
RBC: 4.34 MIL/uL (ref 4.22–5.81)
RDW: 12.5 % (ref 11.5–15.5)
WBC: 9.7 10*3/uL (ref 4.0–10.5)
nRBC: 0 % (ref 0.0–0.2)

## 2021-04-02 LAB — URINALYSIS, ROUTINE W REFLEX MICROSCOPIC
Glucose, UA: NEGATIVE mg/dL
Ketones, ur: >=80 mg/dL — AB
Nitrite: POSITIVE — AB
Protein, ur: >300 mg/dL — AB
Specific Gravity, Urine: >=1.03 (ref 1.005–1.030)
pH: 7 (ref 5.0–8.0)

## 2021-04-02 LAB — COMPREHENSIVE METABOLIC PANEL
ALT: 35 U/L (ref 0–44)
AST: 39 U/L (ref 15–41)
Albumin: 3.5 g/dL (ref 3.5–5.0)
Alkaline Phosphatase: 64 U/L (ref 38–126)
Anion gap: 11 (ref 5–15)
BUN: 23 mg/dL (ref 8–23)
CO2: 25 mmol/L (ref 22–32)
Calcium: 9.1 mg/dL (ref 8.9–10.3)
Chloride: 98 mmol/L (ref 98–111)
Creatinine, Ser: 0.88 mg/dL (ref 0.61–1.24)
GFR, Estimated: >60 mL/min (ref >60)
Glucose, Bld: 133 mg/dL — ABNORMAL HIGH (ref 70–99)
Potassium: 4 mmol/L (ref 3.5–5.1)
Sodium: 134 mmol/L — ABNORMAL LOW (ref 135–145)
Total Bilirubin: 1 mg/dL (ref 0.3–1.2)
Total Protein: 7.1 g/dL (ref 6.5–8.1)

## 2021-04-02 LAB — URINALYSIS, MICROSCOPIC (REFLEX)
RBC / HPF: >50 RBC/hpf (ref 0–5)
WBC, UA: >50 WBC/hpf (ref 0–5)

## 2021-04-02 LAB — LIPASE, BLOOD: Lipase: 28 U/L (ref 11–51)

## 2021-04-02 LAB — CBG MONITORING, ED: Glucose-Capillary: 127 mg/dL — ABNORMAL HIGH (ref 70–99)

## 2021-04-02 LAB — RESP PANEL BY RT-PCR (FLU A&B, COVID) ARPGX2
Influenza A by PCR: NEGATIVE
Influenza B by PCR: NEGATIVE
SARS Coronavirus 2 by RT PCR: POSITIVE — AB

## 2021-04-02 MED ORDER — LACTATED RINGERS IV BOLUS
1000.0000 mL | Freq: Once | INTRAVENOUS | Status: AC
  Administered 2021-04-02: 22:00:00 1000 mL via INTRAVENOUS

## 2021-04-02 MED ORDER — IOHEXOL 300 MG/ML  SOLN
100.0000 mL | Freq: Once | INTRAMUSCULAR | Status: AC | PRN
  Administered 2021-04-02: 22:00:00 100 mL via INTRAVENOUS

## 2021-04-02 MED ORDER — SODIUM CHLORIDE 0.9 % IV SOLN
1.0000 g | Freq: Once | INTRAVENOUS | Status: AC
  Administered 2021-04-02: 22:00:00 1 g via INTRAVENOUS
  Filled 2021-04-02: qty 10

## 2021-04-02 MED ORDER — ONDANSETRON HCL 4 MG/2ML IJ SOLN
4.0000 mg | Freq: Once | INTRAMUSCULAR | Status: AC
  Administered 2021-04-02: 22:00:00 4 mg via INTRAVENOUS
  Filled 2021-04-02: qty 2

## 2021-04-02 MED ORDER — DEXTROSE-NACL 5-0.9 % IV SOLN: INTRAVENOUS | Status: AC

## 2021-04-02 MED ORDER — SODIUM CHLORIDE 0.9 % IV SOLN: INTRAVENOUS | Status: DC

## 2021-04-02 NOTE — Progress Notes (Signed)
Patient: Eric Vang, Folks  DOB: Apr 01, 2046 OIZ:[id number] Transferring facility: [facility] Requesting provider: Dr Pearline Cables (EDP at [facility]) Reason for transfer: admission for further evaluation/management of small bowel obstruction.  75 year old male with h/o bladder cancer s/p resection procedure 1 week ago, who presented to [facility] [place] complaining of 1 to 2 days of nausea, vomiting, abdominal distention, with CT abdomen/pelvis showing evidence of small bowel obstruction with transition point.   He was also diagnosed with UTI at urgent care yesterday, started on Keflex.  His wife was diagnosed with COVID a few days ago, at which time the patient was initially COVID-negative.  However, COVID screen at [facility] is now positive.  No reported respiratory symptoms or chest pain.  EDP discussed case/imaging with on-call general surgeon, Dr. Autumn Messing, who recommended admission to hospitalist service for management of SBO, with plan for general surgery to consult at [facility].   Subsequently, I accepted this patient for transfer for inpatient admission to a med telemetry bed at [facility] for further work-up and management of SBO.     Babs Bertin, DO Hospitalist

## 2021-04-02 NOTE — ED Triage Notes (Addendum)
Pt reports vomited x 3 today; abd is tender to palpation and is distended; currently being treated for UTI and is undergoing bladder CA tx at [facility]; also sts wife tested + for Covid on 12/27

## 2021-04-02 NOTE — ED Provider Notes (Signed)
[facility] EMERGENCY DEPARTMENT Provider Note   CSN: [id number] Arrival date & time: 04/02/21  1727     History Chief Complaint  Patient presents with   Emesis    Eric Vang is a 75 y.o. male.  This is a 75 y.o. male with significant medical history as below, including bladder cancer status post bladder tumor resection, cluster headaches, recent Foley catheter use who presents to the ED with complaint of arthralgias, nausea, vomiting, abdominal pain, dysuria, urgency.  Patient was started on antibiotics for presumed urinary tract infection yesterday, Keflex 5 mg twice daily x7 days.  He has been having nausea and vomiting since yesterday afternoon which he attributed to possible food poisoning.  Abdominal distention.  No fevers or chills.  Positive sick contacts of wife who was diagnosed with COVID-19.  Patient has been vaccinated.  No chest pain or dyspnea.  He has had reduced appetite for the past 24 to 36 hours.  No change in bowel function.  No melena or bright blood per rectum.  No medications at home for symptom control other than antibiotics for UTI. No other acute complaints offered  The history is provided by the patient. No language interpreter was used.  Emesis Associated symptoms: abdominal pain   Associated symptoms: no chills, no cough, no fever and no headaches       Past Medical History:  Diagnosis Date   Anemia    Basal cell carcinoma of nose 2017   removed. a small area right eye   Bladder cancer ([place])    BPH (benign prostatic hyperplasia)    CLUSTER HEADACHE 01/15/2007   2006, 2019   Cluster headaches    Coronary artery disease    DIVERTICULOSIS, COLON 02/04/2008   Elevated PSA    plan possible prostate Bx w/ urology   Erectile dysfunction    Hyperlipidemia    started statin 2015    Inguinal hernia    right   Insomnia    Scrotal varices    Spermatocele of epididymis, single    Unilateral inguinal hernia 2017    Patient  Active Problem List   Diagnosis Date Noted   SBO (small bowel obstruction) ([place]) 04/02/2021   Bladder cancer ([place]) 03/04/2021   Annual physical exam 08/12/2015   PCP NOTES >>>>>>>>>>>>>>>>>>>>>>>>>>>>>>>>>>>>>>>. 06/03/2015   BPH & bladder outlet obstruction 11/13/2014   Hyperlipidemia 11/12/2014   DIVERTICULOSIS, COLON 02/04/2008   INSOMNIA, PERSISTENT 01/15/2007   Chronic headaches 01/15/2007   COLONOSCOPY, HX OF 01/15/2007    Past Surgical History:  Procedure Laterality Date   CARDIAC CATHETERIZATION     > 20 years ago   COLONOSCOPY      x 2 no polyps   COLONOSCOPY W/ POLYPECTOMY  2016 ish   INGUINAL HERNIA REPAIR Right 12/03/2015   Procedure: LAPAROSCOPIC RIGHT INGUINAL HERNIA REPAIR;  Surgeon: [name], MD;  Location: [facility];  Service: General;  Laterality: Right;   INSERTION OF MESH Right 12/03/2015   Procedure: INSERTION OF MESH;  Surgeon: [name], MD;  Location: [facility];  Service: General;  Laterality: Right;   MOHS SURGERY  2017   L nose    RADIOLOGY WITH ANESTHESIA N/A 05/01/2018   Procedure: MRI WITH ANESTHESIA   BRAIN WITH AND WITHOUT;  Surgeon: Radiologist, Medication, MD;  Location: [facility];  Service: Radiology;  Laterality: N/A;       Family History  Problem Relation Age of Onset   Diabetes Mother 64  natural causes.    Atrial fibrillation Mother    Uterine cancer Mother    Cancer Father 92       bone    Cancer Paternal Grandmother        sinus cancer   Colon cancer Neg Hx    Prostate cancer Neg Hx    CAD Neg Hx     Social History   Tobacco Use   Smoking status: Former    Years: 7.00    Types: Cigarettes   Smokeless tobacco: Never   Tobacco comments:    stopped in 1979, was a light smoker  Vaping Use   Vaping Use: Never used  Substance Use Topics   Alcohol use: Not Currently    Alcohol/week: 0.0 standard drinks   Drug use: No    Home Medications Prior to Admission medications   Medication Sig Start Date End Date Taking? Authorizing  Provider  oxybutynin (DITROPAN) 5 MG tablet Take 5 mg by mouth every 8 (eight) hours as needed for bladder spasms.    [provider]  phenazopyridine (PYRIDIUM) 100 MG tablet Take 100 mg by mouth 3 (three) times daily as needed for pain.    [provider]  senna-docusate (SENOKOT-S) 8.6-50 MG tablet Take 1 tablet by mouth at bedtime as needed for mild constipation.    [provider]  simvastatin (ZOCOR) 10 MG tablet Take 1 tablet (10 mg total) by mouth daily. Patient taking differently: Take 10 mg by mouth every evening. 07/28/20   Colon Branch, MD  tamsulosin (FLOMAX) 0.4 MG CAPS capsule Take 1 capsule (0.4 mg total) by mouth daily after supper. 07/28/20   Colon Branch, MD  valACYclovir (VALTREX) 1000 MG tablet TAKE 1 TABLET BY MOUTH TWO (2) TIMES DAILY AS NEEDED FOR COLD SORES. FOR 5 DAYS PER EPISODE Patient not taking: Reported on 03/30/2021 11/15/19   Colon Branch, MD    Allergies    Patient has no known allergies.  Review of Systems   Review of Systems  Constitutional:  Positive for appetite change. Negative for chills and fever.  HENT:  Negative for facial swelling and trouble swallowing.   Eyes:  Negative for photophobia and visual disturbance.  Respiratory:  Negative for cough and shortness of breath.   Cardiovascular:  Negative for chest pain and palpitations.  Gastrointestinal:  Positive for abdominal distention, abdominal pain, nausea and vomiting.  Endocrine: Negative for polydipsia and polyuria.  Genitourinary:  Positive for difficulty urinating and urgency. Negative for hematuria.  Musculoskeletal:  Negative for gait problem and joint swelling.  Skin:  Negative for pallor and rash.  Neurological:  Negative for syncope and headaches.  Psychiatric/Behavioral:  Negative for agitation and confusion.    Physical Exam Updated Vital Signs BP (!) 142/70 (BP Location: Right Arm)    Pulse 84    Temp 98.4 F (36.9 C) (Oral)    Resp 18    Ht 6\' 1"  (1.854 m)     Wt 90.7 kg    SpO2 96%    BMI 26.39 kg/m   Physical Exam Vitals and nursing note reviewed.  Constitutional:      General: He is not in acute distress.    Appearance: He is well-developed.  HENT:     Head: Normocephalic and atraumatic.     Right Ear: External ear normal.     Left Ear: External ear normal.     Mouth/Throat:     Mouth: Mucous membranes are moist.  Eyes:  General: No scleral icterus. Cardiovascular:     Rate and Rhythm: Normal rate and regular rhythm.     Pulses: Normal pulses.     Heart sounds: Normal heart sounds.  Pulmonary:     Effort: Pulmonary effort is normal. No respiratory distress.     Breath sounds: Normal breath sounds.  Abdominal:     General: Abdomen is flat. There is distension.     Palpations: Abdomen is soft.     Tenderness: There is abdominal tenderness in the periumbilical area. There is no right CVA tenderness, left CVA tenderness, guarding or rebound. Negative signs include Murphy's sign.  Musculoskeletal:        General: Normal range of motion.     Cervical back: Normal range of motion.     Right lower leg: No edema.     Left lower leg: No edema.  Skin:    General: Skin is warm and dry.     Capillary Refill: Capillary refill takes less than 2 seconds.  Neurological:     Mental Status: He is alert and oriented to person, place, and time.  Psychiatric:        Mood and Affect: Mood normal.        Behavior: Behavior normal.    ED Results / Procedures / Treatments   Labs (all labs ordered are listed, but only abnormal results are displayed) Labs Reviewed  RESP PANEL BY RT-PCR (FLU A&B, COVID) ARPGX2 - Abnormal; Notable for the following components:      Result Value   SARS Coronavirus 2 by RT PCR POSITIVE (*)    All other components within normal limits  URINALYSIS, ROUTINE W REFLEX MICROSCOPIC - Abnormal; Notable for the following components:   Color, Urine AMBER (*)    APPearance HAZY (*)    Hgb urine dipstick LARGE (*)     Bilirubin Urine SMALL (*)    Ketones, ur >=80 (*)    Protein, ur >300 (*)    Nitrite POSITIVE (*)    Leukocytes,Ua SMALL (*)    All other components within normal limits  COMPREHENSIVE METABOLIC PANEL - Abnormal; Notable for the following components:   Sodium 134 (*)    Glucose, Bld 133 (*)    All other components within normal limits  CBC WITH DIFFERENTIAL/PLATELET - Abnormal; Notable for the following components:   Neutro Abs 8.6 (*)    Lymphs Abs 0.4 (*)    All other components within normal limits  URINALYSIS, MICROSCOPIC (REFLEX) - Abnormal; Notable for the following components:   Bacteria, UA MANY (*)    All other components within normal limits  URINE CULTURE  LIPASE, BLOOD    EKG None  Radiology CT ABDOMEN PELVIS W CONTRAST  Result Date: 04/02/2021 CLINICAL DATA:  Acute abdominal pain. Recent TURP. UTI. History of bladder cancer. EXAM: CT ABDOMEN AND PELVIS WITH CONTRAST TECHNIQUE: Multidetector CT imaging of the abdomen and pelvis was performed using the standard protocol following bolus administration of intravenous contrast. CONTRAST:  156mL OMNIPAQUE IOHEXOL 300 MG/ML  SOLN COMPARISON:  None. FINDINGS: Lower chest: No acute abnormality. Hepatobiliary: There are 2 hypodensities in the right lobe of the liver which are too small to characterize. There is no biliary ductal dilatation. No gallstones are identified. Pancreas: Unremarkable. No pancreatic ductal dilatation or surrounding inflammatory changes. Spleen: Normal in size without focal abnormality. Adrenals/Urinary Tract: There is an 8 cm cyst in the lower pole of the right kidney. Otherwise the kidneys and adrenal glands are within normal  limits. There is diffuse bladder wall thickening and enhancement with surrounding inflammatory stranding. There is a small amount of likely extraluminal gas adjacent to the dome of the bladder image 6/74. Stomach/Bowel: There are dilated small bowel loops with air-fluid levels measuring  up to 5 cm. Transition point is seen in the lower central pelvis image 2/64. Distal small bowel is decompressed. There are few dilated small bowel loops demonstrating mild wall thickening. There is no pneumatosis. The colon is nondilated. Appendix is within normal limits. The stomach is moderately distended with air-fluid level. There is no definite pneumatosis or free air. may be some mild small bowel wall thickening Vascular/Lymphatic: No significant vascular findings are present. No enlarged abdominal or pelvic lymph nodes. Reproductive: Prostate gland is enlarged and heterogeneous measuring 5.9 x 4.4 cm image 2/82. Other: There is no focal abdominal wall hernia. There is trace free fluid in the right upper quadrant. Musculoskeletal: Multilevel degenerative changes affect the spine. There also degenerative changes of the hips bilaterally. IMPRESSION: 1. Diffuse bladder wall thickening with surrounding inflammation worrisome for cystitis. There is a small amount of extraluminal air adjacent to the dome of the bladder suspicious for micro perforation. 2. Small-bowel obstruction with transition point in the central abdomen. No pneumatosis. 3. Trace ascites in the right upper quadrant. 4. Two small indeterminate lesions in the right lobe of the liver. These can be further characterized with liver MRI. 5. Prostatomegaly. Electronically Signed   By: Ronney Asters M.D.   On: 04/02/2021 22:22   DG Chest Portable 1 View  Result Date: 04/02/2021 CLINICAL DATA:  COVID positive, abdominal pain. EXAM: PORTABLE CHEST 1 VIEW COMPARISON:  None. FINDINGS: There is a low inspiration. The cardiac size is normal allowing for low inspiration. There is mild aortic uncoiling and ectasia, minimal calcification in the aortic arch. No vascular congestion is seen. The hypoexpanded lungs are generally clear aside from linear atelectasis in the bases. There is a mild elevation of the right hemidiaphragm. Thoracic cage is intact.  IMPRESSION: Limited assessment of the lung bases due to low inspiration. Linear atelectasis is seen in the bases but no convincing acute pneumonia. Electronically Signed   By: Telford Nab M.D.   On: 04/02/2021 22:36    Procedures .Critical Care Performed by: Jeanell Sparrow, DO Authorized by: Jeanell Sparrow, DO   Critical care provider statement:    Critical care time (minutes):  30   Critical care time was exclusive of:  Separately billable procedures and treating other patients   Critical care was time spent personally by me on the following activities:  Development of treatment plan with patient or surrogate, discussions with consultants, evaluation of patient's response to treatment, examination of patient, ordering and review of laboratory studies, ordering and review of radiographic studies, ordering and performing treatments and interventions, pulse oximetry, re-evaluation of patient's condition and review of old charts Comments:     SBO, complicated uti, covid 19   Medications Ordered in ED Medications  0.9 %  sodium chloride infusion (has no administration in time range)  lactated ringers bolus 1,000 mL (1,000 mLs Intravenous New Bag/Given 04/02/21 2151)  ondansetron (ZOFRAN) injection 4 mg (4 mg Intravenous Given 04/02/21 2153)  cefTRIAXone (ROCEPHIN) 1 g in sodium chloride 0.9 % 100 mL IVPB (1 g Intravenous New Bag/Given 04/02/21 2152)  iohexol (OMNIPAQUE) 300 MG/ML solution 100 mL (100 mLs Intravenous Contrast Given 04/02/21 2157)    ED Course  I have reviewed the triage vital signs and the  nursing notes.  Pertinent labs & imaging results that were available during my care of the patient were reviewed by me and considered in my medical decision making (see chart for details).  Clinical Course as of 04/02/21 2337  Fri Apr 02, 2021  2301 Dr Marlou Starks gen surg, recommend medical admission and they will see on consult [SG]    Clinical Course User Index [SG] Jeanell Sparrow, DO    MDM Rules/Calculators/A&P                          CC: Nausea, vomiting, abdominal pain, urination changes  This patient complains of above; this involves an extensive number of treatment options and is a complaint that carries with it a high risk of complications and morbidity. Vital signs were reviewed. Serious etiologies considered.  Record review:  Previous records obtained and reviewed   Work up as above, notable for:  Labs & imaging results that were available during my care of the patient were reviewed by me and considered in my medical decision making.   I ordered imaging studies which included CT abdomen pelvis, chest x-ray and I independently visualized and interpreted imaging which showed small bowel obstruction with transition point, findings also consistent with cystitis  Urinalysis demonstrates infection, patient was started on Keflex yesterday.  Urinalysis remains infected.  Will give dose of Rocephin.  There is concern for complicated UTI and will increase frequency of Keflex if pt ends up being discharged.  Culture sent.  UA with ketonuria.  He is hemodynamically stable.  Likely associated reduced oral intake over the past 24 hours, nausea and vomiting.  Give IV fluids  Management: Patient given IV antibiotics, antiemetics, IV fluids.  Reassessment:   D/w general surgery Dr. Marlou Starks, recommends medical admission with surgical evaluation on arrival.  D/w Dr Velia Meyer regarding admission, pt accepted to [facility] for SBO, complicated UTI, Covid 19.  He is hemodynamically stable and agreeable to transport. Will hold off on NGT at this time given lack of nausea/vomiting currently, no current abdominal discomfort.   F/u with pcp regarding abnormal liver findings on CT       This chart was dictated using voice recognition software.  Despite best efforts to proofread,  errors can occur which can change the documentation meaning.    Final Clinical Impression(s) / ED  Diagnoses Final diagnoses:  [id number]  Urinary tract infection with hematuria, site unspecified  Small bowel obstruction ([place])  Abnormal CT of liver    Rx / DC Orders ED Discharge Orders     None        Jeanell Sparrow, DO 04/02/21 2338

## 2021-04-03 DIAGNOSIS — E785 Hyperlipidemia, unspecified: Secondary | ICD-10-CM

## 2021-04-03 DIAGNOSIS — N390 Urinary tract infection, site not specified: Secondary | ICD-10-CM | POA: Diagnosis not present

## 2021-04-03 DIAGNOSIS — N401 Enlarged prostate with lower urinary tract symptoms: Secondary | ICD-10-CM | POA: Diagnosis present

## 2021-04-03 DIAGNOSIS — R109 Unspecified abdominal pain: Secondary | ICD-10-CM | POA: Diagnosis not present

## 2021-04-03 DIAGNOSIS — U071 COVID-19: Secondary | ICD-10-CM | POA: Diagnosis not present

## 2021-04-03 DIAGNOSIS — K5669 Other partial intestinal obstruction: Secondary | ICD-10-CM | POA: Diagnosis not present

## 2021-04-03 DIAGNOSIS — K56609 Unspecified intestinal obstruction, unspecified as to partial versus complete obstruction: Secondary | ICD-10-CM | POA: Diagnosis not present

## 2021-04-03 DIAGNOSIS — K5939 Other megacolon: Secondary | ICD-10-CM | POA: Diagnosis not present

## 2021-04-03 DIAGNOSIS — Z87891 Personal history of nicotine dependence: Secondary | ICD-10-CM | POA: Diagnosis not present

## 2021-04-03 DIAGNOSIS — I2510 Atherosclerotic heart disease of native coronary artery without angina pectoris: Secondary | ICD-10-CM | POA: Diagnosis not present

## 2021-04-03 DIAGNOSIS — K567 Ileus, unspecified: Secondary | ICD-10-CM | POA: Diagnosis not present

## 2021-04-03 DIAGNOSIS — G4700 Insomnia, unspecified: Secondary | ICD-10-CM | POA: Diagnosis present

## 2021-04-03 DIAGNOSIS — N138 Other obstructive and reflux uropathy: Secondary | ICD-10-CM | POA: Diagnosis not present

## 2021-04-03 DIAGNOSIS — C678 Malignant neoplasm of overlapping sites of bladder: Secondary | ICD-10-CM | POA: Diagnosis not present

## 2021-04-03 DIAGNOSIS — K6389 Other specified diseases of intestine: Secondary | ICD-10-CM | POA: Diagnosis not present

## 2021-04-03 DIAGNOSIS — Z85828 Personal history of other malignant neoplasm of skin: Secondary | ICD-10-CM | POA: Diagnosis not present

## 2021-04-03 DIAGNOSIS — Z79899 Other long term (current) drug therapy: Secondary | ICD-10-CM | POA: Diagnosis not present

## 2021-04-03 DIAGNOSIS — C679 Malignant neoplasm of bladder, unspecified: Secondary | ICD-10-CM | POA: Diagnosis not present

## 2021-04-03 DIAGNOSIS — K219 Gastro-esophageal reflux disease without esophagitis: Secondary | ICD-10-CM | POA: Diagnosis present

## 2021-04-03 DIAGNOSIS — K56600 Partial intestinal obstruction, unspecified as to cause: Secondary | ICD-10-CM | POA: Diagnosis not present

## 2021-04-03 DIAGNOSIS — K769 Liver disease, unspecified: Secondary | ICD-10-CM | POA: Diagnosis not present

## 2021-04-03 DIAGNOSIS — R319 Hematuria, unspecified: Secondary | ICD-10-CM | POA: Diagnosis present

## 2021-04-03 LAB — BASIC METABOLIC PANEL
Anion gap: 9 (ref 5–15)
BUN: 21 mg/dL (ref 8–23)
CO2: 23 mmol/L (ref 22–32)
Calcium: 8.3 mg/dL — ABNORMAL LOW (ref 8.9–10.3)
Chloride: 104 mmol/L (ref 98–111)
Creatinine, Ser: 0.8 mg/dL (ref 0.61–1.24)
GFR, Estimated: >60 mL/min (ref >60)
Glucose, Bld: 154 mg/dL — ABNORMAL HIGH (ref 70–99)
Potassium: 3.7 mmol/L (ref 3.5–5.1)
Sodium: 136 mmol/L (ref 135–145)

## 2021-04-03 LAB — CBC
HCT: 36.6 % — ABNORMAL LOW (ref 39.0–52.0)
Hemoglobin: 12.7 g/dL — ABNORMAL LOW (ref 13.0–17.0)
MCH: 34.1 pg — ABNORMAL HIGH (ref 26.0–34.0)
MCHC: 34.7 g/dL (ref 30.0–36.0)
MCV: 98.4 fL (ref 80.0–100.0)
Platelets: 301 10*3/uL (ref 150–400)
RBC: 3.72 MIL/uL — ABNORMAL LOW (ref 4.22–5.81)
RDW: 12.7 % (ref 11.5–15.5)
WBC: 8 10*3/uL (ref 4.0–10.5)
nRBC: 0 % (ref 0.0–0.2)

## 2021-04-03 LAB — CBG MONITORING, ED: Glucose-Capillary: 157 mg/dL — ABNORMAL HIGH (ref 70–99)

## 2021-04-03 MED ORDER — ACETAMINOPHEN 650 MG RE SUPP: 650.0000 mg | Freq: Four times a day (QID) | RECTAL | Status: DC | PRN

## 2021-04-03 MED ORDER — PANTOPRAZOLE SODIUM 40 MG IV SOLR
40.0000 mg | INTRAVENOUS | Status: DC
  Administered 2021-04-03 – 2021-04-05 (×3): 40 mg via INTRAVENOUS
  Filled 2021-04-03 (×3): qty 40

## 2021-04-03 MED ORDER — DEXTROSE-NACL 5-0.9 % IV SOLN: INTRAVENOUS | Status: DC

## 2021-04-03 MED ORDER — SODIUM CHLORIDE 0.9 % IV SOLN
1.0000 g | Freq: Once | INTRAVENOUS | Status: AC
  Administered 2021-04-03: 1 g via INTRAVENOUS
  Filled 2021-04-03 (×2): qty 10

## 2021-04-03 MED ORDER — ONDANSETRON HCL 4 MG/2ML IJ SOLN
4.0000 mg | Freq: Four times a day (QID) | INTRAMUSCULAR | Status: DC | PRN
  Administered 2021-04-05: 4 mg via INTRAVENOUS
  Filled 2021-04-03: qty 2

## 2021-04-03 MED ORDER — HEPARIN SODIUM (PORCINE) 5000 UNIT/ML IJ SOLN
5000.0000 [IU] | Freq: Three times a day (TID) | INTRAMUSCULAR | Status: DC
  Administered 2021-04-03 – 2021-04-06 (×9): 5000 [IU] via SUBCUTANEOUS
  Filled 2021-04-03 (×9): qty 1

## 2021-04-03 MED ORDER — KCL IN DEXTROSE-NACL 40-5-0.45 MEQ/L-%-% IV SOLN
INTRAVENOUS | Status: DC
  Filled 2021-04-03 (×6): qty 1000

## 2021-04-03 MED ORDER — ACETAMINOPHEN 325 MG PO TABS: 650.0000 mg | ORAL_TABLET | Freq: Four times a day (QID) | ORAL | Status: DC | PRN

## 2021-04-03 MED ORDER — ONDANSETRON HCL 4 MG PO TABS: 4.0000 mg | ORAL_TABLET | Freq: Four times a day (QID) | ORAL | Status: DC | PRN

## 2021-04-03 MED ORDER — METOCLOPRAMIDE HCL 5 MG/ML IJ SOLN
5.0000 mg | Freq: Three times a day (TID) | INTRAMUSCULAR | Status: AC
  Administered 2021-04-03 – 2021-04-04 (×3): 5 mg via INTRAVENOUS
  Filled 2021-04-03 (×3): qty 2

## 2021-04-03 NOTE — Progress Notes (Signed)
Patient arrived to room  1504. A & O X4 .

## 2021-04-03 NOTE — ED Notes (Signed)
[facility] at bedside 

## 2021-04-03 NOTE — H&P (Signed)
History and Physical    Eric Vang Vang FTD:[id number] DOB: [birth year] DOA: 04/02/2021  PCP: Colon Branch, MD   Patient coming from: Home  I have personally briefly reviewed patient's old medical records in [place]  Chief Complaint: Abdominal pain, nausea and vomiting.  HPI: Eric Vang is a 75 y.o. male with medical history significant of bladder cancer, BPH status post TURP a week ago, hyperlipidemia, GERD and prior history of inguinal hernia status postsurgical repair; who presented to the hospital with complaints of abdominal pain, (colicky in nature, 7 out of 10 in intensity, localized in her mid/lower abdomen and radiating across his flanks, decreased appetite and associated nausea/vomiting (total of 3 episodes no blood seen); patient reported last bowel movement was on Wednesday.  He had never experienced this type of symptoms before.  Patient denies chest pain, fever, melena, hematochezia, hematuria, focal weakness or any other complaints.  Of note, patient reports that her wife turned out to be positive for COVID about 3-4 days ago; at that time he check his self and he was negative.  Patient is vaccinated against COVID and boosted; COVID PCR positive in the ED.  ED Course: Work-up demonstrating positive urinalysis for UTI, CT scan of abdomen and pelvis also demonstrating increased wall thickening in his bladder and concern for cystitis; CT scan also demonstrating a small bowel obstruction with transition point.  Case was discussed with general surgery who recommended transfer to [facility] and hospitalist admission for conservative management.  They will follow in consultation.  Review of Systems: As per HPI otherwise all other systems reviewed and are negative.  Past Medical History:  Diagnosis Date   Anemia    Basal cell carcinoma of nose 2017   removed. a small area right eye   Bladder cancer ([place])    BPH (benign prostatic hyperplasia)     CLUSTER HEADACHE 01/15/2007   2006, 2019   Cluster headaches    Coronary artery disease    DIVERTICULOSIS, COLON 02/04/2008   Elevated PSA    plan possible prostate Bx w/ urology   Erectile dysfunction    Hyperlipidemia    started statin 2015    Inguinal hernia    right   Insomnia    Scrotal varices    Spermatocele of epididymis, single    Unilateral inguinal hernia 2017    Past Surgical History:  Procedure Laterality Date   CARDIAC CATHETERIZATION     > 20 years ago   COLONOSCOPY      x 2 no polyps   COLONOSCOPY W/ POLYPECTOMY  2016 ish   INGUINAL HERNIA REPAIR Right 12/03/2015   Procedure: LAPAROSCOPIC RIGHT INGUINAL HERNIA REPAIR;  Surgeon: [name], MD;  Location: [place];  Service: General;  Laterality: Right;   INSERTION OF MESH Right 12/03/2015   Procedure: INSERTION OF MESH;  Surgeon: [name], MD;  Location: [place];  Service: General;  Laterality: Right;   MOHS SURGERY  2017   L nose    RADIOLOGY WITH ANESTHESIA N/A 05/01/2018   Procedure: MRI WITH ANESTHESIA   BRAIN WITH AND WITHOUT;  Surgeon: Radiologist, Medication, MD;  Location: [place];  Service: Radiology;  Laterality: N/A;    Social History  reports that he has quit smoking. His smoking use included cigarettes. He has never used smokeless tobacco. He reports that he does not currently use alcohol. He reports that he does not use drugs.  No Known Allergies  Family History  Problem  Relation Age of Onset   Diabetes Mother 45       natural causes.    Atrial fibrillation Mother    Uterine cancer Mother    Cancer Father 33       bone    Cancer Paternal Grandmother        sinus cancer   Colon cancer Neg Hx    Prostate cancer Neg Hx    CAD Neg Hx     Prior to Admission medications   Medication Sig Start Date End Date Taking? Authorizing Provider  oxybutynin (DITROPAN) 5 MG tablet Take 5 mg by mouth every 8 (eight) hours as needed for bladder spasms.    [provider]  phenazopyridine  (PYRIDIUM) 100 MG tablet Take 100 mg by mouth 3 (three) times daily as needed for pain.    [provider]  senna-docusate (SENOKOT-S) 8.6-50 MG tablet Take 1 tablet by mouth at bedtime as needed for mild constipation.    [provider]  simvastatin (ZOCOR) 10 MG tablet Take 1 tablet (10 mg total) by mouth daily. Patient taking differently: Take 10 mg by mouth every evening. 07/28/20   Colon Branch, MD  tamsulosin (FLOMAX) 0.4 MG CAPS capsule Take 1 capsule (0.4 mg total) by mouth daily after supper. 07/28/20   Colon Branch, MD  valACYclovir (VALTREX) 1000 MG tablet TAKE 1 TABLET BY MOUTH TWO (2) TIMES DAILY AS NEEDED FOR COLD SORES. FOR 5 DAYS PER EPISODE Patient not taking: Reported on 03/30/2021 11/15/19   Colon Branch, MD    Physical Exam: Vitals:   04/03/21 1930 04/03/21 2045 04/03/21 2100 04/03/21 2159  BP:  126/71 128/73 (!) 155/78  Pulse: 64 63 65 66  Resp: 14 18 (!) 23 20  Temp:    98.6 F (37 C)  TempSrc:    Oral  SpO2: 96% 96% 97% 97%  Weight:      Height:        Constitutional: NAD, calm, comfortable; complaining of hiccups.  Patient reports no bowel movements or passing gas currently. Vitals:   04/03/21 1930 04/03/21 2045 04/03/21 2100 04/03/21 2159  BP:  126/71 128/73 (!) 155/78  Pulse: 64 63 65 66  Resp: 14 18 (!) 23 20  Temp:    98.6 F (37 C)  TempSrc:    Oral  SpO2: 96% 96% 97% 97%  Weight:      Height:       Eyes: PERRL, lids and conjunctivae normal; no icterus, no nystagmus. ENMT: Mucous membranes are moist. Posterior pharynx clear of any exudate or lesions. Neck: normal, supple, no masses, no thyromegaly; no JVD. Respiratory: clear to auscultation bilaterally, no wheezing, no crackles. Normal respiratory effort. No accessory muscle use.  Cardiovascular: Regular rate and rhythm, no murmurs / rubs / gallops. No extremity edema. 2+ pedal pulses. No carotid bruits.  Abdomen: No guarding, mild discomfort in his mid lower abdomen and suprapubic  area.  Decreased bowel sounds appreciated.  No distention. Musculoskeletal: no clubbing / cyanosis. No joint deformity upper and lower extremities. Good ROM, no contractures. Normal muscle tone.  Skin: no rashes, lesions, ulcers. No induration Neurologic: CN 2-12 grossly intact. Sensation intact, DTR normal. Strength 5/5 in all 4.  Psychiatric: Normal judgment and insight. Alert and oriented x 3. Normal mood.   Labs on Admission: I have personally reviewed following labs and imaging studies  CBC: Recent Labs  Lab 03/30/21 2210 04/02/21 1850 04/03/21 1453  WBC 8.3 9.7 8.0  NEUTROABS  7.2 8.6*  --   HGB 14.5 14.6 12.7*  HCT 42.1 42.7 36.6*  MCV 99.3 98.4 98.4  PLT 241 306 144    Basic Metabolic Panel: Recent Labs  Lab 03/30/21 2210 04/02/21 1850 04/03/21 1453  NA 135 134* 136  K 3.8 4.0 3.7  CL 104 98 104  CO2 21* 25 23  GLUCOSE 118* 133* 154*  BUN 16 23 21   CREATININE 1.08 0.88 0.80  CALCIUM 8.8* 9.1 8.3*    GFR: Estimated Creatinine Clearance: 92.9 mL/min (by C-G formula based on SCr of 0.8 mg/dL).  Liver Function Tests: Recent Labs  Lab 03/30/21 2210 04/02/21 1850  AST 27 39  ALT 24 35  ALKPHOS 71 64  BILITOT 1.0 1.0  PROT 6.2* 7.1  ALBUMIN 3.6 3.5    Urine analysis:    Component Value Date/Time   COLORURINE AMBER (A) 04/02/2021 1826   APPEARANCEUR HAZY (A) 04/02/2021 1826   LABSPEC >=1.030 04/02/2021 1826   PHURINE 7.0 04/02/2021 1826   GLUCOSEU NEGATIVE 04/02/2021 1826   GLUCOSEU NEGATIVE 08/24/2017 0836   HGBUR LARGE (A) 04/02/2021 1826   BILIRUBINUR SMALL (A) 04/02/2021 1826   KETONESUR >=80 (A) 04/02/2021 1826   PROTEINUR >300 (A) 04/02/2021 1826   UROBILINOGEN 0.2 08/24/2017 0836   NITRITE POSITIVE (A) 04/02/2021 1826   LEUKOCYTESUR SMALL (A) 04/02/2021 1826    Radiological Exams on Admission: CT ABDOMEN PELVIS W CONTRAST  Result Date: 04/02/2021 CLINICAL DATA:  Acute abdominal pain. Recent TURP. UTI. History of bladder cancer. EXAM:  CT ABDOMEN AND PELVIS WITH CONTRAST TECHNIQUE: Multidetector CT imaging of the abdomen and pelvis was performed using the standard protocol following bolus administration of intravenous contrast. CONTRAST:  190mL OMNIPAQUE IOHEXOL 300 MG/ML  SOLN COMPARISON:  None. FINDINGS: Lower chest: No acute abnormality. Hepatobiliary: There are 2 hypodensities in the right lobe of the liver which are too small to characterize. There is no biliary ductal dilatation. No gallstones are identified. Pancreas: Unremarkable. No pancreatic ductal dilatation or surrounding inflammatory changes. Spleen: Normal in size without focal abnormality. Adrenals/Urinary Tract: There is an 8 cm cyst in the lower pole of the right kidney. Otherwise the kidneys and adrenal glands are within normal limits. There is diffuse bladder wall thickening and enhancement with surrounding inflammatory stranding. There is a small amount of likely extraluminal gas adjacent to the dome of the bladder image 6/74. Stomach/Bowel: There are dilated small bowel loops with air-fluid levels measuring up to 5 cm. Transition point is seen in the lower central pelvis image 2/64. Distal small bowel is decompressed. There are few dilated small bowel loops demonstrating mild wall thickening. There is no pneumatosis. The colon is nondilated. Appendix is within normal limits. The stomach is moderately distended with air-fluid level. There is no definite pneumatosis or free air. may be some mild small bowel wall thickening Vascular/Lymphatic: No significant vascular findings are present. No enlarged abdominal or pelvic lymph nodes. Reproductive: Prostate gland is enlarged and heterogeneous measuring 5.9 x 4.4 cm image 2/82. Other: There is no focal abdominal wall hernia. There is trace free fluid in the right upper quadrant. Musculoskeletal: Multilevel degenerative changes affect the spine. There also degenerative changes of the hips bilaterally. IMPRESSION: 1. Diffuse bladder  wall thickening with surrounding inflammation worrisome for cystitis. There is a small amount of extraluminal air adjacent to the dome of the bladder suspicious for micro perforation. 2. Small-bowel obstruction with transition point in the central abdomen. No pneumatosis. 3. Trace ascites in the right upper  quadrant. 4. Two small indeterminate lesions in the right lobe of the liver. These can be further characterized with liver MRI. 5. Prostatomegaly. Electronically Signed   By: Ronney Asters M.D.   On: 04/02/2021 22:22   DG Chest Portable 1 View  Result Date: 04/02/2021 CLINICAL DATA:  COVID positive, abdominal pain. EXAM: PORTABLE CHEST 1 VIEW COMPARISON:  None. FINDINGS: There is a low inspiration. The cardiac size is normal allowing for low inspiration. There is mild aortic uncoiling and ectasia, minimal calcification in the aortic arch. No vascular congestion is seen. The hypoexpanded lungs are generally clear aside from linear atelectasis in the bases. There is a mild elevation of the right hemidiaphragm. Thoracic cage is intact. IMPRESSION: Limited assessment of the lung bases due to low inspiration. Linear atelectasis is seen in the bases but no convincing acute pneumonia. Electronically Signed   By: Telford Nab M.D.   On: 04/02/2021 22:36    EKG: none   Assessment/Plan 1-SBO (small bowel obstruction) ([place]) -Last bowel movement on 03/31/2021;: No passing gas. -Reports no nausea or vomiting at this time; holding on placing NG tube. -Will provide IV fluids, electrolyte repletion, as needed analgesics and antiemetics. -General surgery will see patient in consultation and will follow recommendations; had prior history of hernia repair and images demonstrating transition point.  2-UTI -With recent instrumentation -Started on Rocephin -Currently without signs of systemic infection -Follow sensitivity -Maintain adequate hydration.    3-COVID-19 -asymptomatic -patient is vaccinated and  boosted against COVID. -Continue symptomatic management only. -Good saturation on room air.   -X-ray not demonstrating acute infiltrates.    4-GERD -IV PPI ordered  5-HLD -Resume statins when able to tolerate p.o.  6-hx of BPH -Status post TURP a week ago -Resume the use of Flomax when able to tolerate by mouth. -Continue outpatient follow-up with urology service.  DVT prophylaxis: Heparin Code Status:   Full code Family Communication:  Wife at bedside. Disposition Plan:   Patient is from:  Home  Anticipated DC to:  Home  Anticipated DC date:  04/06/20  Anticipated DC barriers: Ability to tolerate PO's resolution of SBO and further improvement in UTI.  Consults called:  General Surgery (Dr. Marlou Starks) Admission status:  Inpatient, length of stay more than 2 midnights; telemetry bed.  Severity of Illness: The appropriate patient status for this patient is INPATIENT. Inpatient status is judged to be reasonable and necessary in order to provide the required intensity of service to ensure the patient's safety. The patient's presenting symptoms, physical exam findings, and initial radiographic and laboratory data in the context of their chronic comorbidities is felt to place them at high risk for further clinical deterioration. Furthermore, it is not anticipated that the patient will be medically stable for discharge from the hospital within 2 midnights of admission.   * I certify that at the point of admission it is my clinical judgment that the patient will require inpatient hospital care spanning beyond 2 midnights from the point of admission due to high intensity of service, high risk for further deterioration and high frequency of surveillance required.Barton Dubois MD [facility]  How to contact the [facility] Attending or Consulting provider [place] or covering provider during after hours [place], for this patient?   Check the care team in [facility] and look for a)  attending/consulting [facility] provider listed and b) the [facility] team listed Log into [email or web address] and use [place]'s universal password to access. If you do  not have the password, please contact the hospital operator. Locate the [facility] provider you are looking for under [facility] and page to a number that you can be directly reached. If you still have difficulty reaching the provider, please page the [facility] (Director on Call) for the Hospitalists listed on amion for assistance.  04/03/2021, 10:20 PM

## 2021-04-04 ENCOUNTER — Inpatient Hospital Stay (HOSPITAL_COMMUNITY): Payer: Medicare HMO

## 2021-04-04 DIAGNOSIS — K769 Liver disease, unspecified: Secondary | ICD-10-CM

## 2021-04-04 DIAGNOSIS — K219 Gastro-esophageal reflux disease without esophagitis: Secondary | ICD-10-CM

## 2021-04-04 DIAGNOSIS — K56609 Unspecified intestinal obstruction, unspecified as to partial versus complete obstruction: Secondary | ICD-10-CM | POA: Diagnosis not present

## 2021-04-04 LAB — COMPREHENSIVE METABOLIC PANEL
ALT: 57 U/L — ABNORMAL HIGH (ref 0–44)
AST: 45 U/L — ABNORMAL HIGH (ref 15–41)
Albumin: 2.7 g/dL — ABNORMAL LOW (ref 3.5–5.0)
Alkaline Phosphatase: 52 U/L (ref 38–126)
Anion gap: 6 (ref 5–15)
BUN: 21 mg/dL (ref 8–23)
CO2: 22 mmol/L (ref 22–32)
Calcium: 8.1 mg/dL — ABNORMAL LOW (ref 8.9–10.3)
Chloride: 109 mmol/L (ref 98–111)
Creatinine, Ser: 0.71 mg/dL (ref 0.61–1.24)
GFR, Estimated: >60 mL/min (ref >60)
Glucose, Bld: 109 mg/dL — ABNORMAL HIGH (ref 70–99)
Potassium: 3.3 mmol/L — ABNORMAL LOW (ref 3.5–5.1)
Sodium: 137 mmol/L (ref 135–145)
Total Bilirubin: 0.6 mg/dL (ref 0.3–1.2)
Total Protein: 5.5 g/dL — ABNORMAL LOW (ref 6.5–8.1)

## 2021-04-04 LAB — CBC
HCT: 34.9 % — ABNORMAL LOW (ref 39.0–52.0)
Hemoglobin: 11.6 g/dL — ABNORMAL LOW (ref 13.0–17.0)
MCH: 33.7 pg (ref 26.0–34.0)
MCHC: 33.2 g/dL (ref 30.0–36.0)
MCV: 101.5 fL — ABNORMAL HIGH (ref 80.0–100.0)
Platelets: 286 10*3/uL (ref 150–400)
RBC: 3.44 MIL/uL — ABNORMAL LOW (ref 4.22–5.81)
RDW: 12.7 % (ref 11.5–15.5)
WBC: 7.7 10*3/uL (ref 4.0–10.5)
nRBC: 0 % (ref 0.0–0.2)

## 2021-04-04 LAB — URINE CULTURE

## 2021-04-04 LAB — MAGNESIUM: Magnesium: 2.1 mg/dL (ref 1.7–2.4)

## 2021-04-04 MED ORDER — SODIUM CHLORIDE 0.9 % IV SOLN
1.0000 g | INTRAVENOUS | Status: DC
  Administered 2021-04-04 – 2021-04-05 (×2): 1 g via INTRAVENOUS
  Filled 2021-04-04 (×2): qty 10

## 2021-04-04 NOTE — Assessment & Plan Note (Addendum)
CT with diffuse bladder wall thickening with surrounding inflammation c/f cystitis - extraluminal air adajcent to dome concerning for micro perf Ceftriaxone Urine cx with multiple species, but with imaging findings, will treat empirically x7 days (discharged with 7 days bactrim)

## 2021-04-04 NOTE — Progress Notes (Signed)
PROGRESS NOTE    Eric Vang  AST:[id number] DOB: 08-11-1945 DOA: 04/02/2021 PCP: Colon Branch, MD  Chief Complaint  Patient presents with   Emesis    Brief Narrative:  76 yo with hx bladder cancer s/p TURBT about 1 week ago, BPH, HLD, GERD, and hx inguinal hernia s/p repair who presented to the hospital with abdominal pain.  He was found to have SBO on CT as well as findings concerning for Janiqua Friscia UTI.  General surgery has been c/s.  See below for additional details.    Assessment & Plan:   Principal Problem:   SBO (small bowel obstruction) (HCC) Active Problems:   Acute lower UTI   COVID-19 virus infection   GERD (gastroesophageal reflux disease)   Hyperlipidemia   Bladder cancer (HCC)   * SBO (small bowel obstruction) (HCC)- (present on admission) CT 12/30 with SBO with transition point in central abdomen KUB with persistent partial SBO Pt has passed 4 small BM's since yesterday Clear liquids, slowly advance as tolerated Surgery c/s, appreciate assistance  KUB again tomorrow  Acute lower UTI- (present on admission) CT with diffuse bladder wall thickening with surrounding inflammation c/f cystitis - extraluminal air adajcent to dome concerning for micro perf ceftriaxone   COVID-19 virus infection- (present on admission) Asymptomatic Discussed possible antiviral treatment, will monitor for now  GERD (gastroesophageal reflux disease) PPI  Hyperlipidemia- (present on admission) Holding statin for now  Bladder cancer ([place])- (present on admission) Hx TURBT 03/25/2021 flomax when able to take PO Discussing recent CT findings with our urologist   DVT prophylaxis: heparin Code Status: full Family Communication: wife at bedside Disposition:   Status is: Inpatient  Remains inpatient appropriate because: need for surgery c/s, resolution of SBO       Consultants:  Surgery  Procedures:  none  Antimicrobials:  Anti-infectives (From admission, onward)     Start     Dose/Rate Route Frequency Ordered Stop   04/04/21 2200  cefTRIAXone (ROCEPHIN) 1 g in sodium chloride 0.9 % 100 mL IVPB        1 g 200 mL/hr over 30 Minutes Intravenous Every 24 hours 04/04/21 1707     04/03/21 2130  cefTRIAXone (ROCEPHIN) 1 g in sodium chloride 0.9 % 100 mL IVPB        1 g 200 mL/hr over 30 Minutes Intravenous  Once 04/03/21 1438 04/04/21 0009   04/02/21 2130  cefTRIAXone (ROCEPHIN) 1 g in sodium chloride 0.9 % 100 mL IVPB        1 g 200 mL/hr over 30 Minutes Intravenous  Once 04/02/21 2124 04/03/21 0002       Subjective: Passed 4 stools since yesterday Feels better, asking for something to drink  Objective: Vitals:   04/04/21 0240 04/04/21 0650 04/04/21 1011 04/04/21 1541  BP: 117/63 (!) 114/54 111/69 114/67  Pulse: (!) 59 66 64 63  Resp: 20 20 20 18   Temp: 98.1 F (36.7 C) 98.7 F (37.1 C) 97.8 F (36.6 C) 98.4 F (36.9 C)  TempSrc: Oral Oral Oral Oral  SpO2: 97% 95% 97% 98%  Weight:      Height:        Intake/Output Summary (Last 24 hours) at 04/04/2021 1712 Last data filed at 04/04/2021 1400 Gross per 24 hour  Intake 1643.6 ml  Output --  Net 1643.6 ml   Filed Weights   04/02/21 1736  Weight: 90.7 kg    Examination:  General exam: Appears calm and comfortable  Respiratory  system: unlabored Cardiovascular system: RRR Gastrointestinal system: Abdomen is mildly distended, soft and nontender.  Central nervous system: Alert and oriented. No focal neurological deficits. Extremities: no LEE Skin: No rashes, lesions or ulcers Psychiatry: Judgement and insight appear normal. Mood & affect appropriate.     Data Reviewed: I have personally reviewed following labs and imaging studies  CBC: Recent Labs  Lab 03/30/21 2210 04/02/21 1850 04/03/21 1453 04/04/21 0546  WBC 8.3 9.7 8.0 7.7  NEUTROABS 7.2 8.6*  --   --   HGB 14.5 14.6 12.7* 11.6*  HCT 42.1 42.7 36.6* 34.9*  MCV 99.3 98.4 98.4 101.5*  PLT 241 306 301 286    Basic  Metabolic Panel: Recent Labs  Lab 03/30/21 2210 04/02/21 1850 04/03/21 1453 04/04/21 0546  NA 135 134* 136 137  K 3.8 4.0 3.7 3.3*  CL 104 98 104 109  CO2 21* 25 23 22   GLUCOSE 118* 133* 154* 109*  BUN 16 23 21 21   CREATININE 1.08 0.88 0.80 0.71  CALCIUM 8.8* 9.1 8.3* 8.1*  MG  --   --   --  2.1    GFR: Estimated Creatinine Clearance: 92.9 mL/min (by C-G formula based on SCr of 0.71 mg/dL).  Liver Function Tests: Recent Labs  Lab 03/30/21 2210 04/02/21 1850 04/04/21 0546  AST 27 39 45*  ALT 24 35 57*  ALKPHOS 71 64 52  BILITOT 1.0 1.0 0.6  PROT 6.2* 7.1 5.5*  ALBUMIN 3.6 3.5 2.7*    CBG: Recent Labs  Lab 04/02/21 2353 04/03/21 0213  GLUCAP 127* 157*     Recent Results (from the past 240 hour(s))  Urine Culture     Status: Abnormal   Collection Time: 03/30/21  9:51 PM   Specimen: Urine, Clean Catch  Result Value Ref Range Status   Specimen Description URINE, CLEAN CATCH  Final   Special Requests   Final    NONE Performed at [facility], [place] [address] [place], [place] [address]    Culture MULTIPLE SPECIES PRESENT, SUGGEST RECOLLECTION ([place])  Final   Report Status 03/31/2021 FINAL  Final  Resp Panel by RT-PCR (Flu Olyvia Gopal&B, Covid) Nasopharyngeal Swab     Status: Abnormal   Collection Time: 04/02/21  5:47 PM   Specimen: Nasopharyngeal Swab; Nasopharyngeal(NP) swabs in vial transport medium  Result Value Ref Range Status   SARS Coronavirus 2 by RT PCR POSITIVE (Dale Ribeiro) NEGATIVE Final    Comment: (NOTE) SARS-CoV-2 target nucleic acids are DETECTED.  The SARS-CoV-2 RNA is generally detectable in upper respiratory specimens during the acute phase of infection. Positive results are indicative of the presence of the identified virus, but do not rule out bacterial infection or co-infection with other pathogens not detected by the test. Clinical correlation with patient history and other diagnostic information is necessary to determine patient infection status.  The expected result is Negative.  Fact Sheet for Patients: EntrepreneurPulse.com.au  Fact Sheet for Healthcare Providers: IncredibleEmployment.be  This test is not yet approved or cleared by the [place] FDA and  has been authorized for detection and/or diagnosis of SARS-CoV-2 by FDA under an Emergency Use Authorization (EUA).  This EUA will remain in effect (meaning this test can be used) for the duration of  the COVID-19 declaration under Section 564(b)(1) of the Raymund Manrique ct, 21 U.S.C. section 360bbb-3(b)(1), unless the authorization is terminated or revoked sooner.     Influenza Jacklynn Dehaas by PCR NEGATIVE NEGATIVE Final   Influenza B by PCR NEGATIVE NEGATIVE Final  Comment: (NOTE) The Xpert Xpress SARS-CoV-2/FLU/RSV plus assay is intended as an aid in the diagnosis of influenza from Nasopharyngeal swab specimens and should not be used as Elisavet Buehrer sole basis for treatment. Nasal washings and aspirates are unacceptable for Xpert Xpress SARS-CoV-2/FLU/RSV testing.  Fact Sheet for Patients: EntrepreneurPulse.com.au  Fact Sheet for Healthcare Providers: IncredibleEmployment.be  This test is not yet approved or cleared by the [place] FDA and has been authorized for detection and/or diagnosis of SARS-CoV-2 by FDA under an Emergency Use Authorization (EUA). This EUA will remain in effect (meaning this test can be used) for the duration of the COVID-19 declaration under Section 564(b)(1) of the Act, 21 U.S.C. section 360bbb-3(b)(1), unless the authorization is terminated or revoked.  Performed at [facility], [address] [place], [place] [address]   Urine Culture     Status: Abnormal   Collection Time: 04/02/21 10:55 PM   Specimen: Urine, Clean Catch  Result Value Ref Range Status   Specimen Description   Final    URINE, CLEAN CATCH Performed at [facility], [address] [place], [place] [address]    Special Requests   Final    NONE Performed at [facility], [address] [place], [place] [address]    Culture MULTIPLE SPECIES PRESENT, SUGGEST RECOLLECTION (Shanaia Sievers)  Final   Report Status 04/04/2021 FINAL  Final         Radiology Studies: DG Abd 1 View  Result Date: 04/04/2021 CLINICAL DATA:  Limited for small bowel obstruction. Clinically improved today. EXAM: ABDOMEN - 1 VIEW COMPARISON:  CT, 04/02/2021. FINDINGS: Significant small bowel dilation is noted throughout the mid abdomen. No colonic distension. IMPRESSION: 1. Persistent partial small bowel obstruction. No convincing change from the prior abdomen and pelvis CT. Electronically Signed   By: Lajean Manes M.D.   On: 04/04/2021 14:54   CT ABDOMEN PELVIS W CONTRAST  Result Date: 04/02/2021 CLINICAL DATA:  Acute abdominal pain. Recent TURP. UTI. History of bladder cancer. EXAM: CT ABDOMEN AND PELVIS WITH CONTRAST TECHNIQUE: Multidetector CT imaging of the abdomen and pelvis was performed using the standard protocol following bolus administration of intravenous contrast. CONTRAST:  176mL OMNIPAQUE IOHEXOL 300 MG/ML  SOLN COMPARISON:  None. FINDINGS: Lower chest: No acute abnormality. Hepatobiliary: There are 2 hypodensities in the right lobe of the liver which are too small to characterize. There is no biliary ductal dilatation. No gallstones are identified. Pancreas: Unremarkable. No pancreatic ductal dilatation or surrounding inflammatory changes. Spleen: Normal in size without focal abnormality. Adrenals/Urinary Tract: There is an 8 cm cyst in the lower pole of the right kidney. Otherwise the kidneys and adrenal glands are within normal limits. There is diffuse bladder wall thickening and enhancement with surrounding inflammatory stranding. There is Georgeanne Frankland small amount of likely extraluminal gas adjacent to the dome of the bladder image 6/74. Stomach/Bowel: There are dilated small bowel loops with air-fluid  levels measuring up to 5 cm. Transition point is seen in the lower central pelvis image 2/64. Distal small bowel is decompressed. There are few dilated small bowel loops demonstrating mild wall thickening. There is no pneumatosis. The colon is nondilated. Appendix is within normal limits. The stomach is moderately distended with air-fluid level. There is no definite pneumatosis or free air. may be some mild small bowel wall thickening Vascular/Lymphatic: No significant vascular findings are present. No enlarged abdominal or pelvic lymph nodes. Reproductive: Prostate gland is enlarged and heterogeneous measuring 5.9  x 4.4 cm image 2/82. Other: There is no focal abdominal wall hernia. There is trace free fluid in the right upper quadrant. Musculoskeletal: Multilevel degenerative changes affect the spine. There also degenerative changes of the hips bilaterally. IMPRESSION: 1. Diffuse bladder wall thickening with surrounding inflammation worrisome for cystitis. There is Spence Soberano small amount of extraluminal air adjacent to the dome of the bladder suspicious for micro perforation. 2. Small-bowel obstruction with transition point in the central abdomen. No pneumatosis. 3. Trace ascites in the right upper quadrant. 4. Two small indeterminate lesions in the right lobe of the liver. These can be further characterized with liver MRI. 5. Prostatomegaly. Electronically Signed   By: Ronney Asters M.D.   On: 04/02/2021 22:22   DG Chest Portable 1 View  Result Date: 04/02/2021 CLINICAL DATA:  COVID positive, abdominal pain. EXAM: PORTABLE CHEST 1 VIEW COMPARISON:  None. FINDINGS: There is Jeanpierre Thebeau low inspiration. The cardiac size is normal allowing for low inspiration. There is mild aortic uncoiling and ectasia, minimal calcification in the aortic arch. No vascular congestion is seen. The hypoexpanded lungs are generally clear aside from linear atelectasis in the bases. There is Eino Whitner mild elevation of the right hemidiaphragm. Thoracic cage  is intact. IMPRESSION: Limited assessment of the lung bases due to low inspiration. Linear atelectasis is seen in the bases but no convincing acute pneumonia. Electronically Signed   By: Telford Nab M.D.   On: 04/02/2021 22:36        Scheduled Meds:  heparin  5,000 Units Subcutaneous Q8H   pantoprazole (PROTONIX) IV  40 mg Intravenous Q24H   Continuous Infusions:  cefTRIAXone (ROCEPHIN)  IV     dextrose 5 % and 0.45 % NaCl with KCl 40 mEq/L 75 mL/hr at 04/04/21 1124     LOS: 1 day    Time spent: over 30 min    Fayrene Helper, MD [facility]   To contact the attending provider between 7A-7P or the covering provider during after hours 7P-7A, please log into the web site [email or web address] and access using universal [place] password for that web site. If you do not have the password, please call the hospital operator.  04/04/2021, 5:12 PM

## 2021-04-04 NOTE — Hospital Course (Addendum)
76 yo with hx bladder cancer s/p TURBT about 1 week ago, BPH, HLD, GERD, and hx inguinal hernia s/p repair who presented to the hospital with abdominal pain.  He was found to have SBO on CT as well as findings concerning for Eric Vang UTI.  General surgery has been c/s.  He's now improved from SBO standpoint.  Urology c/s for abnormal CT findings with concern for micro perforation.  They initially discussed imaging, but he initially declined.  With improvement, planning for outpatient follow up.  See below for additional details.

## 2021-04-04 NOTE — Assessment & Plan Note (Addendum)
Resume home meds ?

## 2021-04-04 NOTE — Consult Note (Addendum)
Reason for Consult:abd distension Referring Physician: Dr. Jeni Salles III is an 76 y.o. male.  HPI: The patient is a 76 year old white male who began having abdominal distention on Wednesday or Thursday.  He did have recent bladder and prostate surgery at [facility] and his Foley catheter was removed on Tuesday of this past week.  After the Foley came out he started having more abdominal pain across his lower abdomen.  He started noticing more distention.  He had several episodes of nausea and vomiting.  He went to the emergency department where a CT scan did show some significant inflammatory change around his bladder as well as a possible small bowel obstruction.  He never had an NG tube placed.  It took some time to transfer him to [facility] and during that time he has made significant improvement.  His abdominal distention has improved quite a bit.  He denies any nausea or vomiting.  He has had 4 bowel movements today.  Past Medical History:  Diagnosis Date   Anemia    Basal cell carcinoma of nose 2017   removed. a small area right eye   Bladder cancer ([place])    BPH (benign prostatic hyperplasia)    CLUSTER HEADACHE 01/15/2007   2006, 2019   Cluster headaches    Coronary artery disease    DIVERTICULOSIS, COLON 02/04/2008   Elevated PSA    plan possible prostate Bx w/ urology   Erectile dysfunction    Hyperlipidemia    started statin 2015    Inguinal hernia    right   Insomnia    Scrotal varices    Spermatocele of epididymis, single    Unilateral inguinal hernia 2017    Past Surgical History:  Procedure Laterality Date   CARDIAC CATHETERIZATION     > 20 years ago   COLONOSCOPY      x 2 no polyps   COLONOSCOPY W/ POLYPECTOMY  2016 ish   INGUINAL HERNIA REPAIR Right 12/03/2015   Procedure: LAPAROSCOPIC RIGHT INGUINAL HERNIA REPAIR;  Surgeon: [name], MD;  Location: [place];  Service: General;  Laterality: Right;   INSERTION OF MESH Right  12/03/2015   Procedure: INSERTION OF MESH;  Surgeon: [name], MD;  Location: [place];  Service: General;  Laterality: Right;   MOHS SURGERY  2017   L nose    RADIOLOGY WITH ANESTHESIA N/A 05/01/2018   Procedure: MRI WITH ANESTHESIA   BRAIN WITH AND WITHOUT;  Surgeon: Radiologist, Medication, MD;  Location: [place];  Service: Radiology;  Laterality: N/A;    Family History  Problem Relation Age of Onset   Diabetes Mother 74       natural causes.    Atrial fibrillation Mother    Uterine cancer Mother    Cancer Father 15       bone    Cancer Paternal Grandmother        sinus cancer   Colon cancer Neg Hx    Prostate cancer Neg Hx    CAD Neg Hx     Social History:  reports that he has quit smoking. His smoking use included cigarettes. He has never used smokeless tobacco. He reports that he does not currently use alcohol. He reports that he does not use drugs.  Allergies: No Known Allergies  Medications: I have reviewed the patient's current medications.  Results for orders placed or performed during the hospital encounter of 04/02/21 (from the past 48 hour(s))  Resp Panel  by RT-PCR (Flu A&B, Covid) Nasopharyngeal Swab     Status: Abnormal   Collection Time: 04/02/21  5:47 PM   Specimen: Nasopharyngeal Swab; Nasopharyngeal(NP) swabs in vial transport medium  Result Value Ref Range   SARS Coronavirus 2 by RT PCR POSITIVE (A) NEGATIVE    Comment: (NOTE) SARS-CoV-2 target nucleic acids are DETECTED.  The SARS-CoV-2 RNA is generally detectable in upper respiratory specimens during the acute phase of infection. Positive results are indicative of the presence of the identified virus, but do not rule out bacterial infection or co-infection with other pathogens not detected by the test. Clinical correlation with patient history and other diagnostic information is necessary to determine patient infection status. The expected result is Negative.  Fact Sheet for  Patients: EntrepreneurPulse.com.au  Fact Sheet for Healthcare Providers: IncredibleEmployment.be  This test is not yet approved or cleared by the [place] FDA and  has been authorized for detection and/or diagnosis of SARS-CoV-2 by FDA under an Emergency Use Authorization (EUA).  This EUA will remain in effect (meaning this test can be used) for the duration of  the COVID-19 declaration under Section 564(b)(1) of the A ct, 21 U.S.C. section 360bbb-3(b)(1), unless the authorization is terminated or revoked sooner.     Influenza A by PCR NEGATIVE NEGATIVE   Influenza B by PCR NEGATIVE NEGATIVE    Comment: (NOTE) The Xpert Xpress SARS-CoV-2/FLU/RSV plus assay is intended as an aid in the diagnosis of influenza from Nasopharyngeal swab specimens and should not be used as a sole basis for treatment. Nasal washings and aspirates are unacceptable for Xpert Xpress SARS-CoV-2/FLU/RSV testing.  Fact Sheet for Patients: EntrepreneurPulse.com.au  Fact Sheet for Healthcare Providers: IncredibleEmployment.be  This test is not yet approved or cleared by the [place] FDA and has been authorized for detection and/or diagnosis of SARS-CoV-2 by FDA under an Emergency Use Authorization (EUA). This EUA will remain in effect (meaning this test can be used) for the duration of the COVID-19 declaration under Section 564(b)(1) of the Act, 21 U.S.C. section 360bbb-3(b)(1), unless the authorization is terminated or revoked.  Performed at [facility], [address] [place], [place] [address]   Urinalysis, Routine w reflex microscopic Nasopharyngeal Swab     Status: Abnormal   Collection Time: 04/02/21  6:26 PM  Result Value Ref Range   Color, Urine AMBER (A) YELLOW    Comment: BIOCHEMICALS MAY BE AFFECTED BY COLOR   APPearance HAZY (A) CLEAR   Specific Gravity, Urine >=1.030 1.005 - 1.030   pH 7.0 5.0 -  8.0   Glucose, UA NEGATIVE NEGATIVE mg/dL   Hgb urine dipstick LARGE (A) NEGATIVE   Bilirubin Urine SMALL (A) NEGATIVE   Ketones, ur >=80 (A) NEGATIVE mg/dL   Protein, ur >300 (A) NEGATIVE mg/dL   Nitrite POSITIVE (A) NEGATIVE   Leukocytes,Ua SMALL (A) NEGATIVE    Comment: Performed at [facility], [address] [place], [place] [address]  Urinalysis, Microscopic (reflex)     Status: Abnormal   Collection Time: 04/02/21  6:26 PM  Result Value Ref Range   RBC / HPF >50 0 - 5 RBC/hpf   WBC, UA >50 0 - 5 WBC/hpf   Bacteria, UA MANY (A) NONE SEEN   Squamous Epithelial / LPF 0-5 0 - 5   Mucus PRESENT     Comment: Performed at [facility], [address] [place], [place] [address]  Comprehensive metabolic panel  Status: Abnormal   Collection Time: 04/02/21  6:50 PM  Result Value Ref Range   Sodium 134 (L) 135 - 145 mmol/L   Potassium 4.0 3.5 - 5.1 mmol/L   Chloride 98 98 - 111 mmol/L   CO2 25 22 - 32 mmol/L   Glucose, Bld 133 (H) 70 - 99 mg/dL    Comment: Glucose reference range applies only to samples taken after fasting for at least 8 hours.   BUN 23 8 - 23 mg/dL   Creatinine, Ser 0.88 0.61 - 1.24 mg/dL   Calcium 9.1 8.9 - 10.3 mg/dL   Total Protein 7.1 6.5 - 8.1 g/dL   Albumin 3.5 3.5 - 5.0 g/dL   AST 39 15 - 41 U/L   ALT 35 0 - 44 U/L   Alkaline Phosphatase 64 38 - 126 U/L   Total Bilirubin 1.0 0.3 - 1.2 mg/dL   GFR, Estimated >60 >60 mL/min    Comment: (NOTE) Calculated using the CKD-EPI Creatinine Equation (2021)    Anion gap 11 5 - 15    Comment: Performed at [facility], [address] [place], [place] [address]  CBC with Differential     Status: Abnormal   Collection Time: 04/02/21  6:50 PM  Result Value Ref Range   WBC 9.7 4.0 - 10.5 K/uL   RBC 4.34 4.22 - 5.81 MIL/uL   Hemoglobin 14.6 13.0 - 17.0 g/dL   HCT 42.7 39.0 - 52.0 %   MCV 98.4 80.0 - 100.0 fL   MCH 33.6 26.0 - 34.0 pg   MCHC 34.2 30.0 - 36.0 g/dL   RDW 12.5  11.5 - 15.5 %   Platelets 306 150 - 400 K/uL   nRBC 0.0 0.0 - 0.2 %   Neutrophils Relative % 89 %   Neutro Abs 8.6 (H) 1.7 - 7.7 K/uL   Lymphocytes Relative 4 %   Lymphs Abs 0.4 (L) 0.7 - 4.0 K/uL   Monocytes Relative 7 %   Monocytes Absolute 0.7 0.1 - 1.0 K/uL   Eosinophils Relative 0 %   Eosinophils Absolute 0.0 0.0 - 0.5 K/uL   Basophils Relative 0 %   Basophils Absolute 0.0 0.0 - 0.1 K/uL   Immature Granulocytes 0 %   Abs Immature Granulocytes 0.03 0.00 - 0.07 K/uL    Comment: Performed at [facility], [address] [place], [place] [address]  Lipase, blood     Status: None   Collection Time: 04/02/21  6:50 PM  Result Value Ref Range   Lipase 28 11 - 51 U/L    Comment: Performed at [facility], [address] [place], [place] [address]  Urine Culture     Status: Abnormal   Collection Time: 04/02/21 10:55 PM   Specimen: Urine, Clean Catch  Result Value Ref Range   Specimen Description      URINE, CLEAN CATCH Performed at [facility], [address] [place], [place] [address]    Special Requests      NONE Performed at [facility], [address] [place], [place] [address]    Culture MULTIPLE SPECIES PRESENT, SUGGEST RECOLLECTION (A)    Report Status 04/04/2021 FINAL   CBG monitoring, ED     Status: Abnormal   Collection Time: 04/02/21 11:53 PM  Result Value Ref Range   Glucose-Capillary 127 (H) 70 - 99 mg/dL    Comment: Glucose reference range applies only to  samples taken after fasting for at least 8 hours.  POC CBG, ED     Status: Abnormal   Collection Time: 04/03/21  2:13 AM  Result Value Ref Range   Glucose-Capillary 157 (H) 70 - 99 mg/dL    Comment: Glucose reference range applies only to samples taken after fasting for at least 8 hours.  CBC     Status: Abnormal   Collection Time: 04/03/21  2:53 PM  Result Value Ref Range   WBC 8.0 4.0 - 10.5 K/uL   RBC 3.72 (L) 4.22 - 5.81 MIL/uL   Hemoglobin 12.7 (L) 13.0  - 17.0 g/dL   HCT 36.6 (L) 39.0 - 52.0 %   MCV 98.4 80.0 - 100.0 fL   MCH 34.1 (H) 26.0 - 34.0 pg   MCHC 34.7 30.0 - 36.0 g/dL   RDW 12.7 11.5 - 15.5 %   Platelets 301 150 - 400 K/uL   nRBC 0.0 0.0 - 0.2 %    Comment: Performed at [facility], [address] [place], [place] [address]  Basic metabolic panel     Status: Abnormal   Collection Time: 04/03/21  2:53 PM  Result Value Ref Range   Sodium 136 135 - 145 mmol/L   Potassium 3.7 3.5 - 5.1 mmol/L   Chloride 104 98 - 111 mmol/L   CO2 23 22 - 32 mmol/L   Glucose, Bld 154 (H) 70 - 99 mg/dL    Comment: Glucose reference range applies only to samples taken after fasting for at least 8 hours.   BUN 21 8 - 23 mg/dL   Creatinine, Ser 0.80 0.61 - 1.24 mg/dL   Calcium 8.3 (L) 8.9 - 10.3 mg/dL   GFR, Estimated >60 >60 mL/min    Comment: (NOTE) Calculated using the CKD-EPI Creatinine Equation (2021)    Anion gap 9 5 - 15    Comment: Performed at [facility], [address] [place], [place] [address]  Magnesium     Status: None   Collection Time: 04/04/21  5:46 AM  Result Value Ref Range   Magnesium 2.1 1.7 - 2.4 mg/dL    Comment: Performed at [facility], [place] [address] [place], [place] [address]  Comprehensive metabolic panel     Status: Abnormal   Collection Time: 04/04/21  5:46 AM  Result Value Ref Range   Sodium 137 135 - 145 mmol/L   Potassium 3.3 (L) 3.5 - 5.1 mmol/L   Chloride 109 98 - 111 mmol/L   CO2 22 22 - 32 mmol/L   Glucose, Bld 109 (H) 70 - 99 mg/dL    Comment: Glucose reference range applies only to samples taken after fasting for at least 8 hours.   BUN 21 8 - 23 mg/dL   Creatinine, Ser 0.71 0.61 - 1.24 mg/dL   Calcium 8.1 (L) 8.9 - 10.3 mg/dL   Total Protein 5.5 (L) 6.5 - 8.1 g/dL   Albumin 2.7 (L) 3.5 - 5.0 g/dL   AST 45 (H) 15 - 41 U/L   ALT 57 (H) 0 - 44 U/L   Alkaline Phosphatase 52 38 - 126 U/L   Total Bilirubin 0.6 0.3 - 1.2 mg/dL   GFR, Estimated >60 >60  mL/min    Comment: (NOTE) Calculated using the CKD-EPI Creatinine Equation (2021)    Anion gap 6 5 - 15    Comment: Performed at [facility], [place] [address], [place], [place] [address]  CBC  Status: Abnormal   Collection Time: 04/04/21  5:46 AM  Result Value Ref Range   WBC 7.7 4.0 - 10.5 K/uL   RBC 3.44 (L) 4.22 - 5.81 MIL/uL   Hemoglobin 11.6 (L) 13.0 - 17.0 g/dL   HCT 34.9 (L) 39.0 - 52.0 %   MCV 101.5 (H) 80.0 - 100.0 fL   MCH 33.7 26.0 - 34.0 pg   MCHC 33.2 30.0 - 36.0 g/dL   RDW 12.7 11.5 - 15.5 %   Platelets 286 150 - 400 K/uL   nRBC 0.0 0.0 - 0.2 %    Comment: Performed at [facility], [place] [address], [place], [place] [address]    DG Abd 1 View  Result Date: 04/04/2021 CLINICAL DATA:  Limited for small bowel obstruction. Clinically improved today. EXAM: ABDOMEN - 1 VIEW COMPARISON:  CT, 04/02/2021. FINDINGS: Significant small bowel dilation is noted throughout the mid abdomen. No colonic distension. IMPRESSION: 1. Persistent partial small bowel obstruction. No convincing change from the prior abdomen and pelvis CT. Electronically Signed   By: Lajean Manes M.D.   On: 04/04/2021 14:54   CT ABDOMEN PELVIS W CONTRAST  Result Date: 04/02/2021 CLINICAL DATA:  Acute abdominal pain. Recent TURP. UTI. History of bladder cancer. EXAM: CT ABDOMEN AND PELVIS WITH CONTRAST TECHNIQUE: Multidetector CT imaging of the abdomen and pelvis was performed using the standard protocol following bolus administration of intravenous contrast. CONTRAST:  112mL OMNIPAQUE IOHEXOL 300 MG/ML  SOLN COMPARISON:  None. FINDINGS: Lower chest: No acute abnormality. Hepatobiliary: There are 2 hypodensities in the right lobe of the liver which are too small to characterize. There is no biliary ductal dilatation. No gallstones are identified. Pancreas: Unremarkable. No pancreatic ductal dilatation or surrounding inflammatory changes. Spleen: Normal in size without focal  abnormality. Adrenals/Urinary Tract: There is an 8 cm cyst in the lower pole of the right kidney. Otherwise the kidneys and adrenal glands are within normal limits. There is diffuse bladder wall thickening and enhancement with surrounding inflammatory stranding. There is a small amount of likely extraluminal gas adjacent to the dome of the bladder image 6/74. Stomach/Bowel: There are dilated small bowel loops with air-fluid levels measuring up to 5 cm. Transition point is seen in the lower central pelvis image 2/64. Distal small bowel is decompressed. There are few dilated small bowel loops demonstrating mild wall thickening. There is no pneumatosis. The colon is nondilated. Appendix is within normal limits. The stomach is moderately distended with air-fluid level. There is no definite pneumatosis or free air. may be some mild small bowel wall thickening Vascular/Lymphatic: No significant vascular findings are present. No enlarged abdominal or pelvic lymph nodes. Reproductive: Prostate gland is enlarged and heterogeneous measuring 5.9 x 4.4 cm image 2/82. Other: There is no focal abdominal wall hernia. There is trace free fluid in the right upper quadrant. Musculoskeletal: Multilevel degenerative changes affect the spine. There also degenerative changes of the hips bilaterally. IMPRESSION: 1. Diffuse bladder wall thickening with surrounding inflammation worrisome for cystitis. There is a small amount of extraluminal air adjacent to the dome of the bladder suspicious for micro perforation. 2. Small-bowel obstruction with transition point in the central abdomen. No pneumatosis. 3. Trace ascites in the right upper quadrant. 4. Two small indeterminate lesions in the right lobe of the liver. These can be further characterized with liver MRI. 5. Prostatomegaly. Electronically Signed   By: Ronney Asters M.D.   On: 04/02/2021 22:22   DG Chest Portable 1 View  Result  Date: 04/02/2021 CLINICAL DATA:  COVID positive,  abdominal pain. EXAM: PORTABLE CHEST 1 VIEW COMPARISON:  None. FINDINGS: There is a low inspiration. The cardiac size is normal allowing for low inspiration. There is mild aortic uncoiling and ectasia, minimal calcification in the aortic arch. No vascular congestion is seen. The hypoexpanded lungs are generally clear aside from linear atelectasis in the bases. There is a mild elevation of the right hemidiaphragm. Thoracic cage is intact. IMPRESSION: Limited assessment of the lung bases due to low inspiration. Linear atelectasis is seen in the bases but no convincing acute pneumonia. Electronically Signed   By: Telford Nab M.D.   On: 04/02/2021 22:36    Review of Systems  Constitutional: Negative.   HENT: Negative.    Eyes: Negative.   Respiratory: Negative.    Cardiovascular: Negative.   Gastrointestinal:  Positive for abdominal distention, abdominal pain, nausea and vomiting.  Endocrine: Negative.   Genitourinary: Negative.   Musculoskeletal: Negative.   Skin: Negative.   Allergic/Immunologic: Negative.   Neurological: Negative.   Hematological: Negative.   Psychiatric/Behavioral: Negative.    Blood pressure 114/67, pulse 63, temperature 98.4 F (36.9 C), temperature source Oral, resp. rate 18, height 6\' 1"  (1.854 m), weight 90.7 kg, SpO2 98 %. Physical Exam Vitals reviewed.  Constitutional:      General: He is not in acute distress.    Appearance: Normal appearance.  HENT:     Head: Normocephalic and atraumatic.     Right Ear: External ear normal.     Left Ear: External ear normal.     Nose: Nose normal.     Mouth/Throat:     Mouth: Mucous membranes are moist.     Pharynx: Oropharynx is clear.  Eyes:     General: No scleral icterus.    Extraocular Movements: Extraocular movements intact.     Conjunctiva/sclera: Conjunctivae normal.     Pupils: Pupils are equal, round, and reactive to light.  Cardiovascular:     Rate and Rhythm: Normal rate and regular rhythm.     Pulses:  Normal pulses.     Heart sounds: Normal heart sounds.  Pulmonary:     Effort: Pulmonary effort is normal. No respiratory distress.     Breath sounds: Normal breath sounds.  Abdominal:     General: Abdomen is flat.     Tenderness: There is no abdominal tenderness.     Comments: There is only mild distension. Pt has had 4 bm's today  Musculoskeletal:        General: No swelling or tenderness. Normal range of motion.     Cervical back: Normal range of motion and neck supple. No tenderness.  Skin:    General: Skin is warm and dry.     Coloration: Skin is not jaundiced.  Neurological:     General: No focal deficit present.     Mental Status: He is alert and oriented to person, place, and time.  Psychiatric:        Mood and Affect: Mood normal.        Behavior: Behavior normal.        Thought Content: Thought content normal.    Assessment/Plan: The patient appears to have developed a small bowel obstruction after recent bladder surgery.  The bowel obstruction seems to be resolving on its own.  Since he does not have an NG tube I would go very slowly with advancing his diet.  He should stay on sips of clears today.  We can recheck  an abdominal x-ray tomorrow and if he is improving then we can consider advancing his diet.  If he is not showing significant improvement then we would again need to think about bowel rest and going very slowly with him.  We will follow him closely with you.  He is also COVID-positive but relatively asymptomatic.  It is difficult to know how much this plays into his symptoms as well.  Autumn Messing III 04/04/2021, 4:28 PM

## 2021-04-04 NOTE — Assessment & Plan Note (Addendum)
2 indeterminate lesions in the right lobe of liver Consider MRI outpatient Discussed with pt

## 2021-04-04 NOTE — Assessment & Plan Note (Addendum)
Asymptomatic Discussed possible antiviral treatment, will monitor for now Isolate at least 5 days if asymptomatic

## 2021-04-04 NOTE — Assessment & Plan Note (Addendum)
Hx TURBT 03/25/2021 flomax when able to take PO Discussing recent CT findings with our urologist - urology discussed foley catheter and cystogram to r/o bladder perforation, he initially declined.  Given his continued improvement, will treat for UTI as above and recommended follow outpatient with his primary urologist.

## 2021-04-04 NOTE — Assessment & Plan Note (Signed)
PPI ?

## 2021-04-04 NOTE — Assessment & Plan Note (Addendum)
CT 12/30 with SBO with transition point in central abdomen KUB 1/2 with improved SBO/ileus Continues to have small BM's and pass flatus Soft diet per surgery, gradually advance Surgery c/s, appreciate assistance - stable for d/c per surgery

## 2021-04-04 NOTE — Consult Note (Signed)
H&P Physician requesting consult: Fayrene Helper  Chief Complaint: Concern for possible bladder perforation on CT  History of Present Illness: 76 year old male status post TURBT at [facility] on 03/25/2021.  This was a second resection for high-grade T1 bladder cancer.  He apparently had a 7 cm papillary tumor in the posterior dome/left anterior lateral bladder.  Final pathology revealed high-grade T1.  Muscularis was present and uninvolved.  He subsequent started having abdominal distention on Wednesday or Thursday of last week Foley catheter had been removed last Tuesday.  After the Foley came out he is starting having abdominal pain across the lower abdomen with distention and several episodes of nausea/vomiting.  He subsequent underwent a CT scan on 04/02/2021 that revealed diffuse bladder wall thickening with surrounding inflammation.  There was a small amount of extraluminal air adjacent to the dome of the bladder suspicious for microperforation.  He was also found to have a small bowel obstruction with a transition point.  Since then, the patient has improved clinically.  He no longer has significant abdominal pain or discomfort.  He has some voiding complaints that are at baseline and he is on Flomax.  He denies any increased difficulty with voiding and denies any hematuria/dysuria.  He is now having bowel movements and plenty of flatus.  Past Medical History:  Diagnosis Date   Anemia    Basal cell carcinoma of nose 2017   removed. a small area right eye   Bladder cancer ([place])    BPH (benign prostatic hyperplasia)    CLUSTER HEADACHE 01/15/2007   2006, 2019   Cluster headaches    Coronary artery disease    DIVERTICULOSIS, COLON 02/04/2008   Elevated PSA    plan possible prostate Bx w/ urology   Erectile dysfunction    Hyperlipidemia    started statin 2015    Inguinal hernia    right   Insomnia    Scrotal varices    Spermatocele of epididymis, single    Unilateral inguinal  hernia 2017   Past Surgical History:  Procedure Laterality Date   CARDIAC CATHETERIZATION     > 20 years ago   COLONOSCOPY      x 2 no polyps   COLONOSCOPY W/ POLYPECTOMY  2016 ish   INGUINAL HERNIA REPAIR Right 12/03/2015   Procedure: LAPAROSCOPIC RIGHT INGUINAL HERNIA REPAIR;  Surgeon: [name], MD;  Location: [place];  Service: General;  Laterality: Right;   INSERTION OF MESH Right 12/03/2015   Procedure: INSERTION OF MESH;  Surgeon: [name], MD;  [facility];  Service: General;  Laterality: Right;   MOHS SURGERY  2017   L nose    RADIOLOGY WITH ANESTHESIA N/A 05/01/2018   Procedure: MRI WITH ANESTHESIA   BRAIN WITH AND WITHOUT;  Surgeon: Radiologist, Medication, MD;  Location: [facility];  Service: Radiology;  Laterality: N/A;    Home Medications:  Medications Prior to Admission  Medication Sig Dispense Refill Last Dose   cephALEXin (KEFLEX) 500 MG capsule Take 500 mg by mouth 2 (two) times daily. 7 day supply   04/01/2021   oxybutynin (DITROPAN) 5 MG tablet Take 5 mg by mouth every 8 (eight) hours as needed for bladder spasms.   04/01/2021   phenazopyridine (PYRIDIUM) 100 MG tablet Take 100 mg by mouth 3 (three) times daily as needed for pain.   03/31/2021   senna-docusate (SENOKOT-S) 8.6-50 MG tablet Take 1 tablet by mouth at bedtime as needed for mild constipation.   04/01/2021  simvastatin (ZOCOR) 10 MG tablet Take 1 tablet (10 mg total) by mouth daily. (Patient taking differently: Take 10 mg by mouth every evening.) 90 tablet 1 04/01/2021   tamsulosin (FLOMAX) 0.4 MG CAPS capsule Take 1 capsule (0.4 mg total) by mouth daily after supper. 90 capsule 3 04/01/2021   valACYclovir (VALTREX) 1000 MG tablet TAKE 1 TABLET BY MOUTH TWO (2) TIMES DAILY AS NEEDED FOR COLD SORES. FOR 5 DAYS PER EPISODE (Patient not taking: Reported on 03/30/2021) 30 tablet 3 Not Taking   Allergies: No Known Allergies  Family History  Problem Relation Age of Onset   Diabetes Mother 43       natural  causes.    Atrial fibrillation Mother    Uterine cancer Mother    Cancer Father 6       bone    Cancer Paternal Grandmother        sinus cancer   Colon cancer Neg Hx    Prostate cancer Neg Hx    CAD Neg Hx    Social History:  reports that he has quit smoking. His smoking use included cigarettes. He has never used smokeless tobacco. He reports that he does not currently use alcohol. He reports that he does not use drugs.  ROS: A complete review of systems was performed.  All systems are negative except for pertinent findings as noted. ROS   Physical Exam:  Vital signs in last 24 hours: Temp:  [97.8 F (36.6 C)-98.8 F (37.1 C)] 98.8 F (37.1 C) (01/01 2118) Pulse Rate:  [59-66] 66 (01/01 2118) Resp:  [18-20] 20 (01/01 2118) BP: (111-155)/(54-78) 129/67 (01/01 2118) SpO2:  [95 %-98 %] 97 % (01/01 2118) General:  Alert and oriented, No acute distress HEENT: Normocephalic, atraumatic Neck: No JVD or lymphadenopathy Cardiovascular: Regular rate and rhythm Lungs: Regular rate and effort Abdomen: Soft, nontender, nondistended, no abdominal masses Back: No CVA tenderness Extremities: No edema Neurologic: Grossly intact  Laboratory Data:  Results for orders placed or performed during the hospital encounter of 04/02/21 (from the past 24 hour(s))  Magnesium     Status: None   Collection Time: 04/04/21  5:46 AM  Result Value Ref Range   Magnesium 2.1 1.7 - 2.4 mg/dL  Comprehensive metabolic panel     Status: Abnormal   Collection Time: 04/04/21  5:46 AM  Result Value Ref Range   Sodium 137 135 - 145 mmol/L   Potassium 3.3 (L) 3.5 - 5.1 mmol/L   Chloride 109 98 - 111 mmol/L   CO2 22 22 - 32 mmol/L   Glucose, Bld 109 (H) 70 - 99 mg/dL   BUN 21 8 - 23 mg/dL   Creatinine, Ser 0.71 0.61 - 1.24 mg/dL   Calcium 8.1 (L) 8.9 - 10.3 mg/dL   Total Protein 5.5 (L) 6.5 - 8.1 g/dL   Albumin 2.7 (L) 3.5 - 5.0 g/dL   AST 45 (H) 15 - 41 U/L   ALT 57 (H) 0 - 44 U/L   Alkaline  Phosphatase 52 38 - 126 U/L   Total Bilirubin 0.6 0.3 - 1.2 mg/dL   GFR, Estimated >60 >60 mL/min   Anion gap 6 5 - 15  CBC     Status: Abnormal   Collection Time: 04/04/21  5:46 AM  Result Value Ref Range   WBC 7.7 4.0 - 10.5 K/uL   RBC 3.44 (L) 4.22 - 5.81 MIL/uL   Hemoglobin 11.6 (L) 13.0 - 17.0 g/dL   HCT 34.9 (L) 39.0 -  52.0 %   MCV 101.5 (H) 80.0 - 100.0 fL   MCH 33.7 26.0 - 34.0 pg   MCHC 33.2 30.0 - 36.0 g/dL   RDW 12.7 11.5 - 15.5 %   Platelets 286 150 - 400 K/uL   nRBC 0.0 0.0 - 0.2 %   Recent Results (from the past 240 hour(s))  Urine Culture     Status: Abnormal   Collection Time: 03/30/21  9:51 PM   Specimen: Urine, Clean Catch  Result Value Ref Range Status   Specimen Description URINE, CLEAN CATCH  Final   Special Requests   Final    NONE Performed at [facility], [place] [address] [place], [place] [address]    Culture MULTIPLE SPECIES PRESENT, SUGGEST RECOLLECTION (A)  Final   Report Status 03/31/2021 FINAL  Final  Resp Panel by RT-PCR (Flu A&B, Covid) Nasopharyngeal Swab     Status: Abnormal   Collection Time: 04/02/21  5:47 PM   Specimen: Nasopharyngeal Swab; Nasopharyngeal(NP) swabs in vial transport medium  Result Value Ref Range Status   SARS Coronavirus 2 by RT PCR POSITIVE (A) NEGATIVE Final    Comment: (NOTE) SARS-CoV-2 target nucleic acids are DETECTED.  The SARS-CoV-2 RNA is generally detectable in upper respiratory specimens during the acute phase of infection. Positive results are indicative of the presence of the identified virus, but do not rule out bacterial infection or co-infection with other pathogens not detected by the test. Clinical correlation with patient history and other diagnostic information is necessary to determine patient infection status. The expected result is Negative.  Fact Sheet for Patients: EntrepreneurPulse.com.au  Fact Sheet for Healthcare  Providers: IncredibleEmployment.be  This test is not yet approved or cleared by the [place] FDA and  has been authorized for detection and/or diagnosis of SARS-CoV-2 by FDA under an Emergency Use Authorization (EUA).  This EUA will remain in effect (meaning this test can be used) for the duration of  the COVID-19 declaration under Section 564(b)(1) of the A ct, 21 U.S.C. section 360bbb-3(b)(1), unless the authorization is terminated or revoked sooner.     Influenza A by PCR NEGATIVE NEGATIVE Final   Influenza B by PCR NEGATIVE NEGATIVE Final    Comment: (NOTE) The Xpert Xpress SARS-CoV-2/FLU/RSV plus assay is intended as an aid in the diagnosis of influenza from Nasopharyngeal swab specimens and should not be used as a sole basis for treatment. Nasal washings and aspirates are unacceptable for Xpert Xpress SARS-CoV-2/FLU/RSV testing.  Fact Sheet for Patients: EntrepreneurPulse.com.au  Fact Sheet for Healthcare Providers: IncredibleEmployment.be  This test is not yet approved or cleared by the [place] FDA and has been authorized for detection and/or diagnosis of SARS-CoV-2 by FDA under an Emergency Use Authorization (EUA). This EUA will remain in effect (meaning this test can be used) for the duration of the COVID-19 declaration under Section 564(b)(1) of the Act, 21 U.S.C. section 360bbb-3(b)(1), unless the authorization is terminated or revoked.  Performed at [facility], [address] [place], [place] [address]   Urine Culture     Status: Abnormal   Collection Time: 04/02/21 10:55 PM   Specimen: Urine, Clean Catch  Result Value Ref Range Status   Specimen Description   Final    URINE, CLEAN CATCH Performed at [facility], [address] [place], [place] [address]    Special Requests   Final    NONE Performed at [facility], [place],  [place], [place]  [address]    Culture MULTIPLE SPECIES PRESENT, SUGGEST RECOLLECTION (A)  Final   Report Status 04/04/2021 FINAL  Final   Creatinine: Recent Labs    03/30/21 2210 04/02/21 1850 04/03/21 1453 04/04/21 0546  CREATININE 1.08 0.88 0.80 0.71   CT scan personally reviewed and is detailed in the history of present illness.  There was extraluminal air at the dome of the bladder which is the site of his resection.  No obvious urinoma or fluid collection within the abdomen to suggest a urinoma.  Impression/Assessment:  High-grade T1 bladder cancer Possible bladder perforation Small bowel obstruction versus ileus COVID-19  Plan:  I discussed placement of Foley catheter and cystogram with the patient.  This would be my recommendation to rule out bladder perforation.  Certainly possible that he had a bladder perforation and once the catheter was removed had a small urine leak and the subsequently caused him to have an ileus.  Hard to say if this is the case.  He has improved clinically and declines any additional imaging and catheter placement at this time.  He will reconsider if symptoms return.  Marton Redwood, III 04/04/2021, 9:40 PM

## 2021-04-05 ENCOUNTER — Inpatient Hospital Stay (HOSPITAL_COMMUNITY): Payer: Medicare HMO

## 2021-04-05 DIAGNOSIS — K56609 Unspecified intestinal obstruction, unspecified as to partial versus complete obstruction: Secondary | ICD-10-CM | POA: Diagnosis not present

## 2021-04-05 LAB — CBC WITH DIFFERENTIAL/PLATELET
Abs Immature Granulocytes: 0.12 10*3/uL — ABNORMAL HIGH (ref 0.00–0.07)
Basophils Absolute: 0 10*3/uL (ref 0.0–0.1)
Basophils Relative: 1 %
Eosinophils Absolute: 0.1 10*3/uL (ref 0.0–0.5)
Eosinophils Relative: 2 %
HCT: 34.3 % — ABNORMAL LOW (ref 39.0–52.0)
Hemoglobin: 11.6 g/dL — ABNORMAL LOW (ref 13.0–17.0)
Immature Granulocytes: 2 %
Lymphocytes Relative: 15 %
Lymphs Abs: 1.2 10*3/uL (ref 0.7–4.0)
MCH: 33.3 pg (ref 26.0–34.0)
MCHC: 33.8 g/dL (ref 30.0–36.0)
MCV: 98.6 fL (ref 80.0–100.0)
Monocytes Absolute: 0.6 10*3/uL (ref 0.1–1.0)
Monocytes Relative: 8 %
Neutro Abs: 5.9 10*3/uL (ref 1.7–7.7)
Neutrophils Relative %: 72 %
Platelets: 290 10*3/uL (ref 150–400)
RBC: 3.48 MIL/uL — ABNORMAL LOW (ref 4.22–5.81)
RDW: 12.6 % (ref 11.5–15.5)
WBC: 8 10*3/uL (ref 4.0–10.5)
nRBC: 0 % (ref 0.0–0.2)

## 2021-04-05 LAB — COMPREHENSIVE METABOLIC PANEL
ALT: 43 U/L (ref 0–44)
AST: 24 U/L (ref 15–41)
Albumin: 2.6 g/dL — ABNORMAL LOW (ref 3.5–5.0)
Alkaline Phosphatase: 48 U/L (ref 38–126)
Anion gap: 5 (ref 5–15)
BUN: 12 mg/dL (ref 8–23)
CO2: 22 mmol/L (ref 22–32)
Calcium: 8 mg/dL — ABNORMAL LOW (ref 8.9–10.3)
Chloride: 108 mmol/L (ref 98–111)
Creatinine, Ser: 0.73 mg/dL (ref 0.61–1.24)
GFR, Estimated: >60 mL/min (ref >60)
Glucose, Bld: 104 mg/dL — ABNORMAL HIGH (ref 70–99)
Potassium: 3.6 mmol/L (ref 3.5–5.1)
Sodium: 135 mmol/L (ref 135–145)
Total Bilirubin: 0.5 mg/dL (ref 0.3–1.2)
Total Protein: 5.3 g/dL — ABNORMAL LOW (ref 6.5–8.1)

## 2021-04-05 LAB — PHOSPHORUS: Phosphorus: 2.6 mg/dL (ref 2.5–4.6)

## 2021-04-05 LAB — MAGNESIUM: Magnesium: 2 mg/dL (ref 1.7–2.4)

## 2021-04-05 MED ORDER — SIMVASTATIN 10 MG PO TABS
10.0000 mg | ORAL_TABLET | Freq: Every evening | ORAL | Status: DC
  Administered 2021-04-05: 10 mg via ORAL
  Filled 2021-04-05: qty 1

## 2021-04-05 MED ORDER — TAMSULOSIN HCL 0.4 MG PO CAPS: 0.4000 mg | ORAL_CAPSULE | Freq: Every day | ORAL | Status: DC

## 2021-04-05 MED ORDER — OXYBUTYNIN CHLORIDE 5 MG PO TABS: 5.0000 mg | ORAL_TABLET | Freq: Three times a day (TID) | ORAL | Status: DC | PRN

## 2021-04-05 MED ORDER — TAMSULOSIN HCL 0.4 MG PO CAPS
0.4000 mg | ORAL_CAPSULE | Freq: Every day | ORAL | Status: DC
  Administered 2021-04-05: 0.4 mg via ORAL
  Filled 2021-04-05: qty 1

## 2021-04-05 NOTE — Progress Notes (Signed)
PROGRESS NOTE    COE ANGELOS III  POE:[id number] DOB: Aug 28, 1945 DOA: 04/02/2021 PCP: Colon Branch, MD  Chief Complaint  Patient presents with   Emesis    Brief Narrative:  76 yo with hx bladder cancer s/p TURBT about 1 week ago, BPH, HLD, GERD, and hx inguinal hernia s/p repair who presented to the hospital with abdominal pain.  He was found to have SBO on CT as well as findings concerning for Leviticus Harton UTI.  General surgery has been c/s.  See below for additional details.    Assessment & Plan:   Principal Problem:   SBO (small bowel obstruction) (HCC) Active Problems:   Acute lower UTI   COVID-19 virus infection   GERD (gastroesophageal reflux disease)   Hyperlipidemia   Bladder cancer (HCC)   Liver lesion   * SBO (small bowel obstruction) ([place])- (present on admission) CT 12/30 with SBO with transition point in central abdomen KUB 1/2 with improved SBO/ileus Continues to have small BM's and pass flatus Soft diet per surgery, gradually advance Surgery c/s, appreciate assistance  KUB again tomorrow  Acute lower UTI- (present on admission) CT with diffuse bladder wall thickening with surrounding inflammation c/f cystitis - extraluminal air adajcent to dome concerning for micro perf Ceftriaxone Urine cx with multiple species, but with imaging findings, will treat empirically x7 days   COVID-19 virus infection- (present on admission) Asymptomatic Discussed possible antiviral treatment, will monitor for now  GERD (gastroesophageal reflux disease) PPI  Hyperlipidemia- (present on admission) Holding statin for now  Bladder cancer ([place])- (present on admission) Hx TURBT 03/25/2021 flomax when able to take PO Discussing recent CT findings with our urologist - urology discussed foley catheter and cystogram to r/o bladder perforation, he declined, but will reconsider if sx return  Liver lesion 2 indeterminate lesions in the right lobe of liver Consider MRI  outpatient   DVT prophylaxis: heparin Code Status: full Family Communication: wife at bedside Disposition:   Status is: Inpatient  Remains inpatient appropriate because: need for surgery c/s, resolution of SBO       Consultants:  Surgery  Procedures:  none  Antimicrobials:  Anti-infectives (From admission, onward)    Start     Dose/Rate Route Frequency Ordered Stop   04/04/21 2200  cefTRIAXone (ROCEPHIN) 1 g in sodium chloride 0.9 % 100 mL IVPB        1 g 200 mL/hr over 30 Minutes Intravenous Every 24 hours 04/04/21 1707     04/03/21 2130  cefTRIAXone (ROCEPHIN) 1 g in sodium chloride 0.9 % 100 mL IVPB        1 g 200 mL/hr over 30 Minutes Intravenous  Once 04/03/21 1438 04/04/21 0009   04/02/21 2130  cefTRIAXone (ROCEPHIN) 1 g in sodium chloride 0.9 % 100 mL IVPB        1 g 200 mL/hr over 30 Minutes Intravenous  Once 04/02/21 2124 04/03/21 0002       Subjective: Feels better after taking Quadarius Henton shower Passing small stools, +flatus  Objective: Vitals:   04/04/21 1541 04/04/21 2118 04/05/21 0610 04/05/21 1405  BP: 114/67 129/67 126/66 121/84  Pulse: 63 66 (!) 59 67  Resp: 18 20 20    Temp: 98.4 F (36.9 C) 98.8 F (37.1 C) 98.2 F (36.8 C) 98.7 F (37.1 C)  TempSrc: Oral Oral Oral Axillary  SpO2: 98% 97% 99% 97%  Weight:      Height:        Intake/Output Summary (Last 24 hours)  at 04/05/2021 1934 Last data filed at 04/05/2021 1927 Gross per 24 hour  Intake 2125.18 ml  Output --  Net 2125.18 ml   Filed Weights   04/02/21 1736  Weight: 90.7 kg    Examination:  General: No acute distress. Cardiovascular: RRR Lungs: unlabored Abdomen: Soft, nontender, nondistended  Neurological: Alert and oriented 3. Moves all extremities 4 . Cranial nerves II through XII grossly intact. Skin: Warm and dry. No rashes or lesions. Extremities: No clubbing or cyanosis. No edema.  Data Reviewed: I have personally reviewed following labs and imaging  studies  CBC: Recent Labs  Lab 03/30/21 2210 04/02/21 1850 04/03/21 1453 04/04/21 0546 04/05/21 0537  WBC 8.3 9.7 8.0 7.7 8.0  NEUTROABS 7.2 8.6*  --   --  5.9  HGB 14.5 14.6 12.7* 11.6* 11.6*  HCT 42.1 42.7 36.6* 34.9* 34.3*  MCV 99.3 98.4 98.4 101.5* 98.6  PLT 241 306 301 286 250    Basic Metabolic Panel: Recent Labs  Lab 03/30/21 2210 04/02/21 1850 04/03/21 1453 04/04/21 0546 04/05/21 0537  NA 135 134* 136 137 135  K 3.8 4.0 3.7 3.3* 3.6  CL 104 98 104 109 108  CO2 21* 25 23 22 22   GLUCOSE 118* 133* 154* 109* 104*  BUN 16 23 21 21 12   CREATININE 1.08 0.88 0.80 0.71 0.73  CALCIUM 8.8* 9.1 8.3* 8.1* 8.0*  MG  --   --   --  2.1 2.0  PHOS  --   --   --   --  2.6    GFR: Estimated Creatinine Clearance: 92.9 mL/min (by C-G formula based on SCr of 0.73 mg/dL).  Liver Function Tests: Recent Labs  Lab 03/30/21 2210 04/02/21 1850 04/04/21 0546 04/05/21 0537  AST 27 39 45* 24  ALT 24 35 57* 43  ALKPHOS 71 64 52 48  BILITOT 1.0 1.0 0.6 0.5  PROT 6.2* 7.1 5.5* 5.3*  ALBUMIN 3.6 3.5 2.7* 2.6*    CBG: Recent Labs  Lab 04/02/21 2353 04/03/21 0213  GLUCAP 127* 157*     Recent Results (from the past 240 hour(s))  Urine Culture     Status: Abnormal   Collection Time: 03/30/21  9:51 PM   Specimen: Urine, Clean Catch  Result Value Ref Range Status   Specimen Description URINE, CLEAN CATCH  Final   Special Requests   Final    NONE Performed at [facility], [place] [address], [place], [place] [address]    Culture MULTIPLE SPECIES PRESENT, SUGGEST RECOLLECTION (Jatniel Verastegui)  Final   Report Status 03/31/2021 FINAL  Final  Resp Panel by RT-PCR (Flu Cleda Imel&B, Covid) Nasopharyngeal Swab     Status: Abnormal   Collection Time: 04/02/21  5:47 PM   Specimen: Nasopharyngeal Swab; Nasopharyngeal(NP) swabs in vial transport medium  Result Value Ref Range Status   SARS Coronavirus 2 by RT PCR POSITIVE (Sherlonda Flater) NEGATIVE Final    Comment: (NOTE) SARS-CoV-2 target nucleic acids are  DETECTED.  The SARS-CoV-2 RNA is generally detectable in upper respiratory specimens during the acute phase of infection. Positive results are indicative of the presence of the identified virus, but do not rule out bacterial infection or co-infection with other pathogens not detected by the test. Clinical correlation with patient history and other diagnostic information is necessary to determine patient infection status. The expected result is Negative.  Fact Sheet for Patients: EntrepreneurPulse.com.au  Fact Sheet for Healthcare Providers: IncredibleEmployment.be  This test is not yet approved or cleared by the [place]  States FDA and  has been authorized for detection and/or diagnosis of SARS-CoV-2 by FDA under an Emergency Use Authorization (EUA).  This EUA will remain in effect (meaning this test can be used) for the duration of  the COVID-19 declaration under Section 564(b)(1) of the [place] ct, 21 U.S.C. section 360bbb-3(b)(1), unless the authorization is terminated or revoked sooner.     Influenza Tamar Lipscomb by PCR NEGATIVE NEGATIVE Final   Influenza B by PCR NEGATIVE NEGATIVE Final    Comment: (NOTE) The Xpert Xpress SARS-CoV-2/FLU/RSV plus assay is intended as an aid in the diagnosis of influenza from Nasopharyngeal swab specimens and should not be used as Maleigh Bagot sole basis for treatment. Nasal washings and aspirates are unacceptable for Xpert Xpress SARS-CoV-2/FLU/RSV testing.  Fact Sheet for Patients: EntrepreneurPulse.com.au  Fact Sheet for Healthcare Providers: IncredibleEmployment.be  This test is not yet approved or cleared by the [place] FDA and has been authorized for detection and/or diagnosis of SARS-CoV-2 by FDA under an Emergency Use Authorization (EUA). This EUA will remain in effect (meaning this test can be used) for the duration of the COVID-19 declaration under Section 564(b)(1) of the Act, 21  U.S.C. section 360bbb-3(b)(1), unless the authorization is terminated or revoked.  Performed at [facility], [address], [place], [place] [address]   Urine Culture     Status: Abnormal   Collection Time: 04/02/21 10:55 PM   Specimen: Urine, Clean Catch  Result Value Ref Range Status   Specimen Description   Final    URINE, CLEAN CATCH Performed at [facility], [address] [place], [place] [address]    Special Requests   Final    NONE Performed at [facility], [address] [place], [place] [address]    Culture MULTIPLE SPECIES PRESENT, SUGGEST RECOLLECTION (Socrates Cahoon)  Final   Report Status 04/04/2021 FINAL  Final         Radiology Studies: DG Abd 1 View  Result Date: 04/04/2021 CLINICAL DATA:  Limited for small bowel obstruction. Clinically improved today. EXAM: ABDOMEN - 1 VIEW COMPARISON:  CT, 04/02/2021. FINDINGS: Significant small bowel dilation is noted throughout the mid abdomen. No colonic distension. IMPRESSION: 1. Persistent partial small bowel obstruction. No convincing change from the prior abdomen and pelvis CT. Electronically Signed   By: Lajean Manes M.D.   On: 04/04/2021 14:54   DG Abd 2 Views  Result Date: 04/05/2021 CLINICAL DATA:  Small bowel obstruction. EXAM: ABDOMEN - 2 VIEW COMPARISON:  April 04, 2021. FINDINGS: Decreased small bowel dilatation is noted suggesting improving obstruction or ileus. No colonic dilatation is noted. IMPRESSION: Improved small bowel obstruction or ileus. Electronically Signed   By: Marijo Conception M.D.   On: 04/05/2021 09:12        Scheduled Meds:  heparin  5,000 Units Subcutaneous Q8H   pantoprazole (PROTONIX) IV  40 mg Intravenous Q24H   simvastatin  10 mg Oral QPM   [START ON 04/06/2021] tamsulosin  0.4 mg Oral QPC supper   Continuous Infusions:  cefTRIAXone (ROCEPHIN)  IV 1 g (04/04/21 2222)   dextrose 5 % and 0.45 % NaCl with KCl 40 mEq/L 100 mL/hr at 04/05/21 1043     LOS: 2  days    Time spent: over 30 min    Fayrene Helper, MD [facility]   To contact the attending provider between 7A-7P or the covering provider during after hours 7P-7A, please log into the web site [email or web address]  and access using universal [place] password for that web site. If you do not have the password, please call the hospital operator.  04/05/2021, 7:34 PM

## 2021-04-05 NOTE — Progress Notes (Signed)
Subjective: Patient had multiple bowel movements overnight. Denies nausea/vomiting, tolerated clear liquids yesterday. XR with some improvement this morning.   Objective: Vital signs in last 24 hours: Temp:  [98.2 F (36.8 C)-98.8 F (37.1 C)] 98.2 F (36.8 C) (01/02 0610) Pulse Rate:  [59-66] 59 (01/02 0610) Resp:  [18-20] 20 (01/02 0610) BP: (114-129)/(66-67) 126/66 (01/02 0610) SpO2:  [97 %-99 %] 99 % (01/02 0610) Last BM Date: 04/04/21  Intake/Output from previous day: 01/01 0701 - 01/02 0700 In: 2594.8 [P.O.:660; I.V.:1834.8; IV Piggyback:100] Out: -  Intake/Output this shift: No intake/output data recorded.  PE: General: resting comfortably, NAD Neuro: alert and oriented, no focal deficits Resp: normal work of breathing Abdomen: soft, nondistended, nontender to palpation. Extremities: warm and well-perfused   Lab Results:  Recent Labs    04/04/21 0546 04/05/21 0537  WBC 7.7 8.0  HGB 11.6* 11.6*  HCT 34.9* 34.3*  PLT 286 290   BMET Recent Labs    04/04/21 0546 04/05/21 0537  NA 137 135  K 3.3* 3.6  CL 109 108  CO2 22 22  GLUCOSE 109* 104*  BUN 21 12  CREATININE 0.71 0.73  CALCIUM 8.1* 8.0*   PT/INR No results for input(s): LABPROT, INR in the last 72 hours. CMP     Component Value Date/Time   NA 135 04/05/2021 0537   NA 140 01/28/2021 0000   K 3.6 04/05/2021 0537   CL 108 04/05/2021 0537   CO2 22 04/05/2021 0537   GLUCOSE 104 (H) 04/05/2021 0537   BUN 12 04/05/2021 0537   BUN 22 (A) 01/28/2021 0000   CREATININE 0.73 04/05/2021 0537   CALCIUM 8.0 (L) 04/05/2021 0537   PROT 5.3 (L) 04/05/2021 0537   ALBUMIN 2.6 (L) 04/05/2021 0537   AST 24 04/05/2021 0537   ALT 43 04/05/2021 0537   ALKPHOS 48 04/05/2021 0537   BILITOT 0.5 04/05/2021 0537   GFRNONAA >60 04/05/2021 0537   GFRAA >60 05/01/2018 0747   Lipase     Component Value Date/Time   LIPASE 28 04/02/2021 1850       Studies/Results: DG Abd 1 View  Result Date:  04/04/2021 CLINICAL DATA:  Limited for small bowel obstruction. Clinically improved today. EXAM: ABDOMEN - 1 VIEW COMPARISON:  CT, 04/02/2021. FINDINGS: Significant small bowel dilation is noted throughout the mid abdomen. No colonic distension. IMPRESSION: 1. Persistent partial small bowel obstruction. No convincing change from the prior abdomen and pelvis CT. Electronically Signed   By: Lajean Manes M.D.   On: 04/04/2021 14:54   DG Abd 2 Views  Result Date: 04/05/2021 CLINICAL DATA:  Small bowel obstruction. EXAM: ABDOMEN - 2 VIEW COMPARISON:  April 04, 2021. FINDINGS: Decreased small bowel dilatation is noted suggesting improving obstruction or ileus. No colonic dilatation is noted. IMPRESSION: Improved small bowel obstruction or ileus. Electronically Signed   By: Marijo Conception M.D.   On: 04/05/2021 09:12    Anti-infectives: Anti-infectives (From admission, onward)    Start     Dose/Rate Route Frequency Ordered Stop   04/04/21 2200  cefTRIAXone (ROCEPHIN) 1 g in sodium chloride 0.9 % 100 mL IVPB        1 g 200 mL/hr over 30 Minutes Intravenous Every 24 hours 04/04/21 1707     04/03/21 2130  cefTRIAXone (ROCEPHIN) 1 g in sodium chloride 0.9 % 100 mL IVPB        1 g 200 mL/hr over 30 Minutes Intravenous  Once 04/03/21 1438  04/04/21 0009   04/02/21 2130  cefTRIAXone (ROCEPHIN) 1 g in sodium chloride 0.9 % 100 mL IVPB        1 g 200 mL/hr over 30 Minutes Intravenous  Once 04/02/21 2124 04/03/21 0002        Assessment/Plan 76 yo male with bladder cancer, recent surgery at [facility]. Presenting with lower abdominal pain after foley removal, with abdominal distension and vomiting. CT concerning for SBO. Obstructive symptoms have resolved and he is now having regular bowel function. - Advance to soft diet today - Evaluated by urology yesterday - General surgery will continue to follow   LOS: 2 days    Michaelle Birks, MD [facility] Surgery General, Hepatobiliary and Pancreatic  Surgery 04/05/21 12:57 PM

## 2021-04-06 ENCOUNTER — Inpatient Hospital Stay (HOSPITAL_COMMUNITY): Payer: Medicare HMO

## 2021-04-06 DIAGNOSIS — R109 Unspecified abdominal pain: Secondary | ICD-10-CM | POA: Diagnosis not present

## 2021-04-06 DIAGNOSIS — K56609 Unspecified intestinal obstruction, unspecified as to partial versus complete obstruction: Secondary | ICD-10-CM | POA: Diagnosis not present

## 2021-04-06 LAB — COMPREHENSIVE METABOLIC PANEL
ALT: 47 U/L — ABNORMAL HIGH (ref 0–44)
AST: 34 U/L (ref 15–41)
Albumin: 2.8 g/dL — ABNORMAL LOW (ref 3.5–5.0)
Alkaline Phosphatase: 60 U/L (ref 38–126)
Anion gap: 6 (ref 5–15)
BUN: 7 mg/dL — ABNORMAL LOW (ref 8–23)
CO2: 24 mmol/L (ref 22–32)
Calcium: 8.2 mg/dL — ABNORMAL LOW (ref 8.9–10.3)
Chloride: 107 mmol/L (ref 98–111)
Creatinine, Ser: 0.98 mg/dL (ref 0.61–1.24)
GFR, Estimated: >60 mL/min (ref >60)
Glucose, Bld: 99 mg/dL (ref 70–99)
Potassium: 3.9 mmol/L (ref 3.5–5.1)
Sodium: 137 mmol/L (ref 135–145)
Total Bilirubin: 0.8 mg/dL (ref 0.3–1.2)
Total Protein: 5.5 g/dL — ABNORMAL LOW (ref 6.5–8.1)

## 2021-04-06 LAB — CBC WITH DIFFERENTIAL/PLATELET
Abs Immature Granulocytes: 0.23 10*3/uL — ABNORMAL HIGH (ref 0.00–0.07)
Basophils Absolute: 0 10*3/uL (ref 0.0–0.1)
Basophils Relative: 0 %
Eosinophils Absolute: 0.1 10*3/uL (ref 0.0–0.5)
Eosinophils Relative: 1 %
HCT: 35.2 % — ABNORMAL LOW (ref 39.0–52.0)
Hemoglobin: 11.8 g/dL — ABNORMAL LOW (ref 13.0–17.0)
Immature Granulocytes: 2 %
Lymphocytes Relative: 12 %
Lymphs Abs: 1.3 10*3/uL (ref 0.7–4.0)
MCH: 33.2 pg (ref 26.0–34.0)
MCHC: 33.5 g/dL (ref 30.0–36.0)
MCV: 99.2 fL (ref 80.0–100.0)
Monocytes Absolute: 0.7 10*3/uL (ref 0.1–1.0)
Monocytes Relative: 6 %
Neutro Abs: 8.7 10*3/uL — ABNORMAL HIGH (ref 1.7–7.7)
Neutrophils Relative %: 79 %
Platelets: 365 10*3/uL (ref 150–400)
RBC: 3.55 MIL/uL — ABNORMAL LOW (ref 4.22–5.81)
RDW: 12.6 % (ref 11.5–15.5)
WBC: 11.1 10*3/uL — ABNORMAL HIGH (ref 4.0–10.5)
nRBC: 0 % (ref 0.0–0.2)

## 2021-04-06 LAB — PHOSPHORUS: Phosphorus: 4 mg/dL (ref 2.5–4.6)

## 2021-04-06 LAB — MAGNESIUM: Magnesium: 1.9 mg/dL (ref 1.7–2.4)

## 2021-04-06 MED ORDER — OXYBUTYNIN CHLORIDE 5 MG PO TABS
5.0000 mg | ORAL_TABLET | Freq: Three times a day (TID) | ORAL | Status: DC
  Administered 2021-04-06: 5 mg via ORAL
  Filled 2021-04-06: qty 1

## 2021-04-06 MED ORDER — SULFAMETHOXAZOLE-TRIMETHOPRIM 800-160 MG PO TABS: 1.0000 | ORAL_TABLET | Freq: Two times a day (BID) | ORAL | 0 refills | Status: DC

## 2021-04-06 MED ORDER — PANTOPRAZOLE SODIUM 40 MG PO TBEC: 40.0000 mg | DELAYED_RELEASE_TABLET | Freq: Every day | ORAL | Status: DC

## 2021-04-06 NOTE — Progress Notes (Signed)
Subjective: Still having multiple bowel movements - intermittently with urgency and not liquid. Tolerating soft diet without nausea, emesis., abdominal pain, bloating   Objective: Vital signs in last 24 hours: Temp:  [97.6 F (36.4 C)-98.7 F (37.1 C)] 97.6 F (36.4 C) (01/03 0413) Pulse Rate:  [62-79] 62 (01/03 0413) Resp:  [18-20] 18 (01/03 0413) BP: (114-121)/(64-84) 114/70 (01/03 0413) SpO2:  [94 %-97 %] 94 % (01/03 0413) Last BM Date: 04/05/21  Intake/Output from previous day: 01/02 0701 - 01/03 0700 In: 944 [P.O.:944] Out: -  Intake/Output this shift: No intake/output data recorded.  PE: General: resting comfortably on EOB, NAD Neuro: alert and oriented, no focal deficits Resp: normal work of breathing. CTAB Abdomen: soft, nondistended, nontender to palpation. +BS Extremities: warm and well-perfused   Lab Results:  Recent Labs    04/05/21 0537 04/06/21 0442  WBC 8.0 11.1*  HGB 11.6* 11.8*  HCT 34.3* 35.2*  PLT 290 365    BMET Recent Labs    04/05/21 0537 04/06/21 0442  NA 135 137  K 3.6 3.9  CL 108 107  CO2 22 24  GLUCOSE 104* 99  BUN 12 7*  CREATININE 0.73 0.98  CALCIUM 8.0* 8.2*    PT/INR No results for input(s): LABPROT, INR in the last 72 hours. CMP     Component Value Date/Time   NA 137 04/06/2021 0442   NA 140 01/28/2021 0000   K 3.9 04/06/2021 0442   CL 107 04/06/2021 0442   CO2 24 04/06/2021 0442   GLUCOSE 99 04/06/2021 0442   BUN 7 (L) 04/06/2021 0442   BUN 22 (A) 01/28/2021 0000   CREATININE 0.98 04/06/2021 0442   CALCIUM 8.2 (L) 04/06/2021 0442   PROT 5.5 (L) 04/06/2021 0442   ALBUMIN 2.8 (L) 04/06/2021 0442   AST 34 04/06/2021 0442   ALT 47 (H) 04/06/2021 0442   ALKPHOS 60 04/06/2021 0442   BILITOT 0.8 04/06/2021 0442   GFRNONAA >60 04/06/2021 0442   GFRAA >60 05/01/2018 0747   Lipase     Component Value Date/Time   LIPASE 28 04/02/2021 1850       Studies/Results: DG Abd 1 View  Result Date:  04/04/2021 CLINICAL DATA:  Limited for small bowel obstruction. Clinically improved today. EXAM: ABDOMEN - 1 VIEW COMPARISON:  CT, 04/02/2021. FINDINGS: Significant small bowel dilation is noted throughout the mid abdomen. No colonic distension. IMPRESSION: 1. Persistent partial small bowel obstruction. No convincing change from the prior abdomen and pelvis CT. Electronically Signed   By: Lajean Manes M.D.   On: 04/04/2021 14:54   DG Abd 2 Views  Result Date: 04/06/2021 CLINICAL DATA:  Abdominal pain EXAM: ABDOMEN - 2 VIEW COMPARISON:  04/05/2021 FINDINGS: There is decrease in the small bowel dilation. Gas and stool are present in colon. Stomach is not distended. IMPRESSION: There is interval decrease in small bowel dilation. Nonspecific bowel gas pattern in the current study. Electronically Signed   By: Elmer Picker M.D.   On: 04/06/2021 08:27   DG Abd 2 Views  Result Date: 04/05/2021 CLINICAL DATA:  Small bowel obstruction. EXAM: ABDOMEN - 2 VIEW COMPARISON:  April 04, 2021. FINDINGS: Decreased small bowel dilatation is noted suggesting improving obstruction or ileus. No colonic dilatation is noted. IMPRESSION: Improved small bowel obstruction or ileus. Electronically Signed   By: Marijo Conception M.D.   On: 04/05/2021 09:12    Anti-infectives: Anti-infectives (From admission, onward)    Start  Dose/Rate Route Frequency Ordered Stop   04/04/21 2200  cefTRIAXone (ROCEPHIN) 1 g in sodium chloride 0.9 % 100 mL IVPB        1 g 200 mL/hr over 30 Minutes Intravenous Every 24 hours 04/04/21 1707     04/03/21 2130  cefTRIAXone (ROCEPHIN) 1 g in sodium chloride 0.9 % 100 mL IVPB        1 g 200 mL/hr over 30 Minutes Intravenous  Once 04/03/21 1438 04/04/21 0009   04/02/21 2130  cefTRIAXone (ROCEPHIN) 1 g in sodium chloride 0.9 % 100 mL IVPB        1 g 200 mL/hr over 30 Minutes Intravenous  Once 04/02/21 2124 04/03/21 0002        Assessment/Plan 76 yo male with bladder cancer, recent  surgery at [facility]. Presenting with lower abdominal pain after foley removal, with abdominal distension and vomiting. CT concerning for SBO. Obstructive symptoms have resolved and he is now having regular bowel function. - Advanced to soft diet - tolerating well - has been evaluated by urology here - from SBO standpoint patient is stable for discharge  Straightforward Medical Decision Making    LOS: 3 days   Winferd Humphrey, [facility] Surgery 04/06/2021, 10:17 AM Please see Amion for pager number during day hours 7:00am-4:30pm

## 2021-04-06 NOTE — Progress Notes (Signed)

## 2021-04-06 NOTE — Clinical Social Work Note (Signed)
°  Transition of Care [facility]) Screening Note   Patient Details  Name: ARDEN TINOCO III Date of Birth: 1945-06-24   Transition of Care [facility]) CM/SW Contact:    Trish Mage, LCSW Phone Number: 04/06/2021, 2:33 PM    Transition of Care Department [facility]) has reviewed patient and no TOC needs have been identified at this time. We will continue to monitor patient advancement through interdisciplinary progression rounds. If new patient transition needs arise, please place a TOC consult.

## 2021-04-06 NOTE — Discharge Summary (Signed)
Physician Discharge Summary  Eric Vang FYB:[id number] DOB: 01/31/1946 DOA: 04/02/2021  PCP: Eric Branch, MD  Admit date: 04/02/2021 Discharge date: 04/06/2021  Time spent: 40 minutes  Recommendations for Outpatient Follow-up:  Follow outpatient CBC/CMP  Follow with his primary urologist at [facility] outpatient regarding possible microperforation and any additional imaging in the future Isolation per covid guidelines  Occasional elevated BG's, follow a1c outpatient  Discharge Diagnoses:  Principal Problem:   SBO (small bowel obstruction) ([place]) Active Problems:   Acute lower UTI   COVID-19 virus infection   GERD (gastroesophageal reflux disease)   Hyperlipidemia   Bladder cancer ([place])   Liver lesion   Discharge Condition: stable  Diet recommendation: heart healthy  Filed Weights   04/02/21 1736  Weight: 90.7 kg    History of present illness:  76 yo with hx bladder cancer s/p TURBT about 1 week ago, BPH, HLD, GERD, and hx inguinal hernia s/p repair who presented to the hospital with abdominal pain.  He was found to have SBO on CT as well as findings concerning for Eric Vang UTI.  General surgery has been c/s.  He's now improved from SBO standpoint.  Urology c/s for abnormal CT findings with concern for micro perforation.  They initially discussed imaging, but he initially declined.  With improvement, planning for outpatient follow up.  See below for additional details.  Hospital Course:  * SBO (small bowel obstruction) (HCC)- (present on admission) CT 12/30 with SBO with transition point in central abdomen KUB 1/2 with improved SBO/ileus Continues to have small BM's and pass flatus Soft diet per surgery, gradually advance Surgery c/s, appreciate assistance - stable for d/c per surgery  Acute lower UTI- (present on admission) CT with diffuse bladder wall thickening with surrounding inflammation c/f cystitis - extraluminal air adajcent to dome concerning for micro  perf Ceftriaxone Urine cx with multiple species, but with imaging findings, will treat empirically x7 days (discharged with 7 days bactrim)   COVID-19 virus infection- (present on admission) Asymptomatic Discussed possible antiviral treatment, will monitor for now Isolate at least 5 days if asymptomatic  GERD (gastroesophageal reflux disease) PPI  Hyperlipidemia- (present on admission) Resume home meds  Bladder cancer ([place])- (present on admission) Hx TURBT 03/25/2021 flomax when able to take PO Discussing recent CT findings with our urologist - urology discussed foley catheter and cystogram to r/o bladder perforation, he initially declined.  Given his continued improvement, will treat for UTI as above and recommended follow outpatient with his primary urologist.  Liver lesion 2 indeterminate lesions in the right lobe of liver Consider MRI outpatient Discussed with pt   Procedures: none   Consultations: Surgery urology  Discharge Exam: Vitals:   04/06/21 0413 04/06/21 1400  BP: 114/70 114/66  Pulse: 62 72  Resp: 18 18  Temp: 97.6 F (36.4 C) 97.6 F (36.4 C)  SpO2: 94% 97%   Feels well Eager for dischareg Wife at bedside  General: No acute distress. Cardiovascular: RRR Lungs: unlabored Abdomen: Soft, nontender, nondistended  Neurological: Alert and oriented 3. Moves all extremities 4 . Cranial nerves II through XII grossly intact. Skin: Warm and dry. No rashes or lesions. Extremities: No clubbing or cyanosis. No edema.  Discharge Instructions   Discharge Instructions     Call MD for:  difficulty breathing, headache or visual disturbances   Complete by: As directed    Call MD for:  hives   Complete by: As directed    Call MD for:  persistant nausea  and vomiting   Complete by: As directed    Call MD for:  redness, tenderness, or signs of infection (pain, swelling, redness, odor or green/yellow discharge around incision site)   Complete by: As  directed    Call MD for:  severe uncontrolled pain   Complete by: As directed    Call MD for:  temperature >100.4   Complete by: As directed    Diet - low sodium heart healthy   Complete by: As directed    Discharge instructions   Complete by: As directed    You were seen for Eric Vang small bowel obstruction.  This has improved.  You'll need to follow up with your outpatient providers.  Continue to gradually advance your diet.  We're treating you for Eric Vang UTI.  You had abnormal CT imaging of your bladder concerning for Eric Vang UTI and possible microperforation.  As you've improved, please follow this up with your urologist outpatient.  You may need Eric Vang CT cystogram.  If you have worsening pain or symptoms I'd recommend following up emergently.  We'll send you home with another 7 days of bactrim for your UTI.    You had abnormal imaging findings on your CT scan which will need further follow up with MRI.  You had 2 small indeterminate lesions in the right lobe of the liver that need to be evaluated with MRI.  You should isolate for 5 days from your positive covid test.  If you have no symptoms, your isolation can end after the 5th day.  12/30 was the date of your positive test.  You can end isolation after 04/07/2021.  Return for new, recurrent, or worsening symptoms.  Please ask your PCP to request records from this hospitalization so they know what was done and what the next steps will be.   Increase activity slowly   Complete by: As directed       Allergies as of 04/06/2021   No Known Allergies      Medication List     STOP taking these medications    cephALEXin 500 MG capsule Commonly known as: KEFLEX       TAKE these medications    oxybutynin 5 MG tablet Commonly known as: DITROPAN Take 5 mg by mouth every 8 (eight) hours as needed for bladder spasms.   phenazopyridine 100 MG tablet Commonly known as: PYRIDIUM Take 100 mg by mouth 3 (three) times daily as needed for pain.    senna-docusate 8.6-50 MG tablet Commonly known as: Senokot-S Take 1 tablet by mouth at bedtime as needed for mild constipation.   simvastatin 10 MG tablet Commonly known as: ZOCOR Take 1 tablet (10 mg total) by mouth daily. What changed: when to take this   sulfamethoxazole-trimethoprim 800-160 MG tablet Commonly known as: BACTRIM DS Take 1 tablet by mouth 2 (two) times daily for 7 days.   tamsulosin 0.4 MG Caps capsule Commonly known as: FLOMAX Take 1 capsule (0.4 mg total) by mouth daily after supper.   valACYclovir 1000 MG tablet Commonly known as: VALTREX TAKE 1 TABLET BY MOUTH TWO (2) TIMES DAILY AS NEEDED FOR COLD SORES. FOR 5 DAYS PER EPISODE       No Known Allergies    The results of significant diagnostics from this hospitalization (including imaging, microbiology, ancillary and laboratory) are listed below for reference.    Significant Diagnostic Studies: DG Abd 1 View  Result Date: 04/04/2021 CLINICAL DATA:  Limited for small bowel obstruction. Clinically improved today. EXAM: ABDOMEN -  1 VIEW COMPARISON:  CT, 04/02/2021. FINDINGS: Significant small bowel dilation is noted throughout the mid abdomen. No colonic distension. IMPRESSION: 1. Persistent partial small bowel obstruction. No convincing change from the prior abdomen and pelvis CT. Electronically Signed   By: Lajean Manes M.D.   On: 04/04/2021 14:54   CT ABDOMEN PELVIS W CONTRAST  Result Date: 04/02/2021 CLINICAL DATA:  Acute abdominal pain. Recent TURP. UTI. History of bladder cancer. EXAM: CT ABDOMEN AND PELVIS WITH CONTRAST TECHNIQUE: Multidetector CT imaging of the abdomen and pelvis was performed using the standard protocol following bolus administration of intravenous contrast. CONTRAST:  16mL OMNIPAQUE IOHEXOL 300 MG/ML  SOLN COMPARISON:  None. FINDINGS: Lower chest: No acute abnormality. Hepatobiliary: There are 2 hypodensities in the right lobe of the liver which are too small to characterize. There  is no biliary ductal dilatation. No gallstones are identified. Pancreas: Unremarkable. No pancreatic ductal dilatation or surrounding inflammatory changes. Spleen: Normal in size without focal abnormality. Adrenals/Urinary Tract: There is an 8 cm cyst in the lower pole of the right kidney. Otherwise the kidneys and adrenal glands are within normal limits. There is diffuse bladder wall thickening and enhancement with surrounding inflammatory stranding. There is Domanick Cuccia small amount of likely extraluminal gas adjacent to the dome of the bladder image 6/74. Stomach/Bowel: There are dilated small bowel loops with air-fluid levels measuring up to 5 cm. Transition point is seen in the lower central pelvis image 2/64. Distal small bowel is decompressed. There are few dilated small bowel loops demonstrating mild wall thickening. There is no pneumatosis. The Eric is nondilated. Appendix is within normal limits. The stomach is moderately distended with air-fluid level. There is no definite pneumatosis or free air. may be some mild small bowel wall thickening Vascular/Lymphatic: No significant vascular findings are present. No enlarged abdominal or pelvic lymph nodes. Reproductive: Prostate gland is enlarged and heterogeneous measuring 5.9 x 4.4 cm image 2/82. Other: There is no focal abdominal wall hernia. There is trace free fluid in the right upper quadrant. Musculoskeletal: Multilevel degenerative changes affect the spine. There also degenerative changes of the hips bilaterally. IMPRESSION: 1. Diffuse bladder wall thickening with surrounding inflammation worrisome for cystitis. There is Keymoni Mccaster small amount of extraluminal air adjacent to the dome of the bladder suspicious for micro perforation. 2. Small-bowel obstruction with transition point in the central abdomen. No pneumatosis. 3. Trace ascites in the right upper quadrant. 4. Two small indeterminate lesions in the right lobe of the liver. These can be further characterized with  liver MRI. 5. Prostatomegaly. Electronically Signed   By: Ronney Asters M.D.   On: 04/02/2021 22:22   DG Chest Portable 1 View  Result Date: 04/02/2021 CLINICAL DATA:  COVID positive, abdominal pain. EXAM: PORTABLE CHEST 1 VIEW COMPARISON:  None. FINDINGS: There is Brooklin Rieger low inspiration. The cardiac size is normal allowing for low inspiration. There is mild aortic uncoiling and ectasia, minimal calcification in the aortic arch. No vascular congestion is seen. The hypoexpanded lungs are generally clear aside from linear atelectasis in the bases. There is Oluwadamilare Tobler mild elevation of the right hemidiaphragm. Thoracic cage is intact. IMPRESSION: Limited assessment of the lung bases due to low inspiration. Linear atelectasis is seen in the bases but no convincing acute pneumonia. Electronically Signed   By: Telford Nab M.D.   On: 04/02/2021 22:36   DG Abd 2 Views  Result Date: 04/06/2021 CLINICAL DATA:  Abdominal pain EXAM: ABDOMEN - 2 VIEW COMPARISON:  04/05/2021 FINDINGS: There is decrease  in the small bowel dilation. Gas and stool are present in Eric. Stomach is not distended. IMPRESSION: There is interval decrease in small bowel dilation. Nonspecific bowel gas pattern in the current study. Electronically Signed   By: Elmer Picker M.D.   On: 04/06/2021 08:27   DG Abd 2 Views  Result Date: 04/05/2021 CLINICAL DATA:  Small bowel obstruction. EXAM: ABDOMEN - 2 VIEW COMPARISON:  April 04, 2021. FINDINGS: Decreased small bowel dilatation is noted suggesting improving obstruction or ileus. No colonic dilatation is noted. IMPRESSION: Improved small bowel obstruction or ileus. Electronically Signed   By: Marijo Conception M.D.   On: 04/05/2021 09:12    Microbiology: Recent Results (from the past 240 hour(s))  Urine Culture     Status: Abnormal   Collection Time: 03/30/21  9:51 PM   Specimen: Urine, Clean Catch  Result Value Ref Range Status   Specimen Description URINE, CLEAN CATCH  Final   Special Requests    Final    NONE Performed at [facility], [address], [place], [place] [address]    Culture MULTIPLE SPECIES PRESENT, SUGGEST RECOLLECTION (Deasia Chiu)  Final   Report Status 03/31/2021 FINAL  Final  Resp Panel by RT-PCR (Flu Emmalee Solivan&B, Covid) Nasopharyngeal Swab     Status: Abnormal   Collection Time: 04/02/21  5:47 PM   Specimen: Nasopharyngeal Swab; Nasopharyngeal(NP) swabs in vial transport medium  Result Value Ref Range Status   SARS Coronavirus 2 by RT PCR POSITIVE (Ansen Sayegh) NEGATIVE Final    Comment: (NOTE) SARS-CoV-2 target nucleic acids are DETECTED.  The SARS-CoV-2 RNA is generally detectable in upper respiratory specimens during the acute phase of infection. Positive results are indicative of the presence of the identified virus, but do not rule out bacterial infection or co-infection with other pathogens not detected by the test. Clinical correlation with patient history and other diagnostic information is necessary to determine patient infection status. The expected result is Negative.  Fact Sheet for Patients: EntrepreneurPulse.com.au  Fact Sheet for Healthcare Providers: IncredibleEmployment.be  This test is not yet approved or cleared by the [place] FDA and  has been authorized for detection and/or diagnosis of SARS-CoV-2 by FDA under an Emergency Use Authorization (EUA).  This EUA will remain in effect (meaning this test can be used) for the duration of  the COVID-19 declaration under Section 564(b)(1) of the Thamara Leger ct, 21 U.S.C. section 360bbb-3(b)(1), unless the authorization is terminated or revoked sooner.     Influenza Kaetlin Bullen by PCR NEGATIVE NEGATIVE Final   Influenza B by PCR NEGATIVE NEGATIVE Final    Comment: (NOTE) The Xpert Xpress SARS-CoV-2/FLU/RSV plus assay is intended as an aid in the diagnosis of influenza from Nasopharyngeal swab specimens and should not be used as Mahiya Kercheval sole basis for treatment. Nasal washings and aspirates  are unacceptable for Xpert Xpress SARS-CoV-2/FLU/RSV testing.  Fact Sheet for Patients: EntrepreneurPulse.com.au  Fact Sheet for Healthcare Providers: IncredibleEmployment.be  This test is not yet approved or cleared by the [place] FDA and has been authorized for detection and/or diagnosis of SARS-CoV-2 by FDA under an Emergency Use Authorization (EUA). This EUA will remain in effect (meaning this test can be used) for the duration of the COVID-19 declaration under Section 564(b)(1) of the Act, 21 U.S.C. section 360bbb-3(b)(1), unless the authorization is terminated or revoked.  Performed at [facility], [address], [place], [place] [address]   Urine Culture     Status: Abnormal   Collection Time:  04/02/21 10:55 PM   Specimen: Urine, Clean Catch  Result Value Ref Range Status   Specimen Description   Final    URINE, CLEAN CATCH Performed at [facility], [address] [place], [place] [address]    Special Requests   Final    NONE Performed at [facility], [address] [place], [place] [address]    Culture MULTIPLE SPECIES PRESENT, SUGGEST RECOLLECTION (Maigen Mozingo)  Final   Report Status 04/04/2021 FINAL  Final     Labs: Basic Metabolic Panel: Recent Labs  Lab 04/02/21 1850 04/03/21 1453 04/04/21 0546 04/05/21 0537 04/06/21 0442  NA 134* 136 137 135 137  K 4.0 3.7 3.3* 3.6 3.9  CL 98 104 109 108 107  CO2 25 23 22 22 24   GLUCOSE 133* 154* 109* 104* 99  BUN 23 21 21 12  7*  CREATININE 0.88 0.80 0.71 0.73 0.98  CALCIUM 9.1 8.3* 8.1* 8.0* 8.2*  MG  --   --  2.1 2.0 1.9  PHOS  --   --   --  2.6 4.0   Liver Function Tests: Recent Labs  Lab 03/30/21 2210 04/02/21 1850 04/04/21 0546 04/05/21 0537 04/06/21 0442  AST 27 39 45* 24 34  ALT 24 35 57* 43 47*  ALKPHOS 71 64 52 48 60  BILITOT 1.0 1.0 0.6 0.5 0.8  PROT 6.2* 7.1 5.5* 5.3* 5.5*  ALBUMIN 3.6 3.5 2.7* 2.6* 2.8*   Recent Labs   Lab 04/02/21 1850  LIPASE 28   No results for input(s): AMMONIA in the last 168 hours. CBC: Recent Labs  Lab 03/30/21 2210 04/02/21 1850 04/03/21 1453 04/04/21 0546 04/05/21 0537 04/06/21 0442  WBC 8.3 9.7 8.0 7.7 8.0 11.1*  NEUTROABS 7.2 8.6*  --   --  5.9 8.7*  HGB 14.5 14.6 12.7* 11.6* 11.6* 11.8*  HCT 42.1 42.7 36.6* 34.9* 34.3* 35.2*  MCV 99.3 98.4 98.4 101.5* 98.6 99.2  PLT 241 306 301 286 290 365   Cardiac Enzymes: No results for input(s): CKTOTAL, CKMB, CKMBINDEX, TROPONINI in the last 168 hours. BNP: BNP (last 3 results) No results for input(s): BNP in the last 8760 hours.  ProBNP (last 3 results) No results for input(s): PROBNP in the last 8760 hours.  CBG: Recent Labs  Lab 04/02/21 2353 04/03/21 0213  GLUCAP 127* 157*       Signed:  Fayrene Helper MD.  [facility] 04/06/2021, 9:40 PM

## 2021-04-08 ENCOUNTER — Telehealth: Payer: Self-pay

## 2021-04-08 NOTE — Telephone Encounter (Signed)
Transition Care Management Follow-up Telephone Call Date of discharge and from where: 04/06/2021-[facility] How have you been since you were released from the hospital? Doing well Any questions or concerns? No  Items Reviewed: Did the pt receive and understand the discharge instructions provided? Yes  Medications obtained and verified? Yes  Other? Yes  Any new allergies since your discharge? No  Dietary orders reviewed? Yes Do you have support at home? Yes   Home Care and Equipment/Supplies: Were home health services ordered? no If so, what is the name of the agency? N/a  Has the agency set up a time to come to the patient's home? not applicable Were any new equipment or medical supplies ordered?  No What is the name of the medical supply agency? N/a Were you able to get the supplies/equipment? not applicable Do you have any questions related to the use of the equipment or supplies? N/a  Functional Questionnaire: (I = Independent and D = Dependent) ADLs: I  Bathing/Dressing- I  Meal Prep- I  Eating- I  Maintaining continence- I  Transferring/Ambulation- I  Managing Meds- I  Follow up appointments reviewed:  PCP Hospital f/u appt confirmed? Yes  Scheduled to see Dr. Larose Kells on 04/13/2021 @ 11:20. [facility] f/u appt confirmed? No  but he does have surgery scheduled with urologist at [facility] on 05/20/2021. Are transportation arrangements needed? No  If their condition worsens, is the pt aware to call PCP or go to the Emergency Dept.? Yes Was the patient provided with contact information for the PCP's office or ED? Yes Was to pt encouraged to call back with questions or concerns? Yes

## 2021-04-09 DIAGNOSIS — U071 COVID-19: Secondary | ICD-10-CM | POA: Diagnosis not present

## 2021-04-09 DIAGNOSIS — C679 Malignant neoplasm of bladder, unspecified: Secondary | ICD-10-CM | POA: Diagnosis not present

## 2021-04-09 DIAGNOSIS — K219 Gastro-esophageal reflux disease without esophagitis: Secondary | ICD-10-CM | POA: Diagnosis not present

## 2021-04-09 DIAGNOSIS — N400 Enlarged prostate without lower urinary tract symptoms: Secondary | ICD-10-CM | POA: Diagnosis not present

## 2021-04-09 DIAGNOSIS — Z01818 Encounter for other preprocedural examination: Secondary | ICD-10-CM | POA: Diagnosis not present

## 2021-04-09 DIAGNOSIS — Z8616 Personal history of COVID-19: Secondary | ICD-10-CM | POA: Diagnosis not present

## 2021-04-13 ENCOUNTER — Ambulatory Visit (INDEPENDENT_AMBULATORY_CARE_PROVIDER_SITE_OTHER): Payer: Medicare HMO | Admitting: Internal Medicine

## 2021-04-13 ENCOUNTER — Encounter: Payer: Self-pay | Admitting: Internal Medicine

## 2021-04-13 VITALS — BP 122/72 | HR 74 | Temp 98.0°F | Resp 18 | Ht 73.0 in | Wt 200.4 lb

## 2021-04-13 DIAGNOSIS — C679 Malignant neoplasm of bladder, unspecified: Secondary | ICD-10-CM | POA: Diagnosis not present

## 2021-04-13 DIAGNOSIS — R932 Abnormal findings on diagnostic imaging of liver and biliary tract: Secondary | ICD-10-CM | POA: Diagnosis not present

## 2021-04-13 DIAGNOSIS — K56609 Unspecified intestinal obstruction, unspecified as to partial versus complete obstruction: Secondary | ICD-10-CM | POA: Diagnosis not present

## 2021-04-13 NOTE — Patient Instructions (Addendum)
See you in June

## 2021-04-13 NOTE — Assessment & Plan Note (Signed)
TCM 14 SBO, UTI, COVID-19: Was admitted to the hospital a few days after a TURBT, SBO (first ever) resolved with conservative treatment. Was discharged home on Bactrim which he finished. Clinically doing great, see review of systems. CMP and CBC just prior to discharge within normal. Bladder cancer: Follow-up at [facility], they plan to get echo stress test prior to bladder surgery and prostatectomy. Liver lesions on CT: Incidental finding, rec to d/w urology team @ [facility]. Consider MRI RTC June 2023.

## 2021-04-13 NOTE — Progress Notes (Signed)
Subjective:    Patient ID: Eric Vang, male    DOB: Feb 17, 1946, 76 y.o.   MRN: [id number]  DOS:  04/13/2021 Type of visit - description: TCM 14  Patient have TURBT 03/25/2021, performed by urology at another institution. Subsequently presented to the ER with abdominal pain, nausea, vomiting.  Admitted to hospital on discharge 04/06/2021. - dx w/ SBO, resolved with conservative care, surgery saw the patient while in the hospital. - UTI: CT showed diffuse bladder wall thickening, possible microperforation after the TURBT.  Was discharged on Bactrim. - COVID-19: Prior to admission, patient's wife was positive for COVID, patient was asymptomatic but tested positive.  No need for antivirals. - Liver lesions found, consider MRI.  Since he left the hospital is feeling well. No fever chills Bowel movements normal No nausea vomiting Has some difficulty urinating at baseline with no dysuria or gross hematuria.  Review of Systems See above   Past Medical History:  Diagnosis Date   Anemia    Basal cell carcinoma of nose 2017   removed. a small area right eye   Bladder cancer ([place])    BPH (benign prostatic hyperplasia)    CLUSTER HEADACHE 01/15/2007   2006, 2019   Cluster headaches    Coronary artery disease    DIVERTICULOSIS, COLON 02/04/2008   Elevated PSA    plan possible prostate Bx w/ urology   Erectile dysfunction    Hyperlipidemia    started statin 2015    Inguinal hernia    right   Insomnia    Scrotal varices    Spermatocele of epididymis, single    Unilateral inguinal hernia 2017    Past Surgical History:  Procedure Laterality Date   CARDIAC CATHETERIZATION     > 20 years ago   COLONOSCOPY      x 2 no polyps   COLONOSCOPY W/ POLYPECTOMY  2016 ish   INGUINAL HERNIA REPAIR Right 12/03/2015   Procedure: LAPAROSCOPIC RIGHT INGUINAL HERNIA REPAIR;  Surgeon: [name], MD;  Location: [facility];  Service: General;  Laterality: Right;   INSERTION OF MESH Right  12/03/2015   Procedure: INSERTION OF MESH;  Surgeon: [name], MD;  Location: [facility];  Service: General;  Laterality: Right;   MOHS SURGERY  2017   L nose    RADIOLOGY WITH ANESTHESIA N/A 05/01/2018   Procedure: MRI WITH ANESTHESIA   BRAIN WITH AND WITHOUT;  Surgeon: Radiologist, Medication, MD;  Location: [facility];  Service: Radiology;  Laterality: N/A;    Current Outpatient Medications  Medication Instructions   oxybutynin (DITROPAN) 5 mg, Every 8 hours PRN   phenazopyridine (PYRIDIUM) 100 mg, 3 times daily PRN   senna-docusate (SENOKOT-S) 8.6-50 MG tablet 1 tablet, At bedtime PRN   simvastatin (ZOCOR) 10 mg, Oral, Daily   sulfamethoxazole-trimethoprim (BACTRIM DS) 800-160 MG tablet 1 tablet, Oral, 2 times daily   tamsulosin (FLOMAX) 0.4 mg, Oral, Daily after supper   valACYclovir (VALTREX) 1000 MG tablet TAKE 1 TABLET BY MOUTH TWO (2) TIMES DAILY AS NEEDED FOR COLD SORES. FOR 5 DAYS PER EPISODE       Objective:   Physical Exam BP 122/72 (BP Location: Left Arm, Patient Position: Sitting, Cuff Size: Small)    Pulse 74    Temp 98 F (36.7 C) (Oral)    Resp 18    Ht 6\' 1"  (1.854 m)    Wt 200 lb 6 oz (90.9 kg)    SpO2 97%    BMI 26.44 kg/m  General:   Well developed, NAD, BMI noted.  HEENT:  Normocephalic . Face symmetric, atraumatic Lungs:  CTA B Normal respiratory effort, no intercostal retractions, no accessory muscle use. Heart: RRR,  no murmur.  Abdomen:  Not distended, soft, non-tender. No rebound or rigidity.   Skin: Not pale. Not jaundice Lower extremities: no pretibial edema bilaterally  Neurologic:  alert & oriented X3.  Speech normal, gait appropriate for age and unassisted Psych--  Cognition and judgment appear intact.  Cooperative with normal attention span and concentration.  Behavior appropriate. No anxious or depressed appearing.     Assessment    Assessment Hyperlipidemia , rx statins 2015 Insomnia BCC, nose 2017 Cold sores, prn Valtrex H/o cluster  HAs 1 -- 2006. Had  sx again 2019, MRI 04/2018; topamax helped but had s/e (fatigue, mental fog) Elevated PSA 08-2015, UCX (-), s/p abx, saw urology, DRE benign except for mild enlargement, PSA decreased, dx w/  Sx of bladder outlet obstruction Bladder cancer Dx [phone] [place]).  TURBT at [facility] 03/2021  PLAN: TCM 14 SBO, UTI, COVID-19: Was admitted to the hospital a few days after a TURBT, SBO (first ever) resolved with conservative treatment. Was discharged home on Bactrim which he finished. Clinically doing great, see review of systems. CMP and CBC just prior to discharge within normal. Bladder cancer: Follow-up at [facility], they plan to get echo stress test prior to bladder surgery and prostatectomy. Liver lesions on CT: Incidental finding, rec to d/w urology team @ [facility]. Consider MRI RTC June 2023.    This visit occurred during the SARS-CoV-2 public health emergency.  Safety protocols were in place, including screening questions prior to the visit, additional usage of staff PPE, and extensive cleaning of exam room while observing appropriate contact time as indicated for disinfecting solutions.

## 2021-04-14 DIAGNOSIS — N400 Enlarged prostate without lower urinary tract symptoms: Secondary | ICD-10-CM | POA: Diagnosis not present

## 2021-04-14 DIAGNOSIS — K219 Gastro-esophageal reflux disease without esophagitis: Secondary | ICD-10-CM | POA: Diagnosis not present

## 2021-04-14 DIAGNOSIS — Z01818 Encounter for other preprocedural examination: Secondary | ICD-10-CM | POA: Diagnosis not present

## 2021-04-14 DIAGNOSIS — Z7189 Other specified counseling: Secondary | ICD-10-CM | POA: Diagnosis not present

## 2021-04-14 DIAGNOSIS — C678 Malignant neoplasm of overlapping sites of bladder: Secondary | ICD-10-CM | POA: Diagnosis not present

## 2021-04-14 DIAGNOSIS — Z9889 Other specified postprocedural states: Secondary | ICD-10-CM | POA: Diagnosis not present

## 2021-04-28 DIAGNOSIS — L821 Other seborrheic keratosis: Secondary | ICD-10-CM | POA: Diagnosis not present

## 2021-04-28 DIAGNOSIS — Z85828 Personal history of other malignant neoplasm of skin: Secondary | ICD-10-CM | POA: Diagnosis not present

## 2021-04-28 DIAGNOSIS — L570 Actinic keratosis: Secondary | ICD-10-CM | POA: Diagnosis not present

## 2021-04-30 ENCOUNTER — Encounter: Payer: Self-pay | Admitting: Internal Medicine

## 2021-04-30 ENCOUNTER — Ambulatory Visit (INDEPENDENT_AMBULATORY_CARE_PROVIDER_SITE_OTHER): Payer: Medicare HMO | Admitting: Internal Medicine

## 2021-04-30 VITALS — BP 132/82 | HR 76 | Temp 97.6°F | Resp 18 | Ht 73.0 in | Wt 201.2 lb

## 2021-04-30 DIAGNOSIS — N390 Urinary tract infection, site not specified: Secondary | ICD-10-CM

## 2021-04-30 MED ORDER — KETOCONAZOLE 2 % EX CREA: 1.0000 "application " | TOPICAL_CREAM | Freq: Every day | CUTANEOUS | 0 refills | Status: DC

## 2021-04-30 NOTE — Patient Instructions (Signed)
Provide a urine sample  Use the cream I sent to your pharmacy (Nizoral) twice daily for 1 week.  Apply to the tip of the penis where it is red.  After you finish the cream, apply Desitin OTC to the area  Try to keep the area dry with some powder  Call if severe symptoms, fever or chills.

## 2021-04-30 NOTE — Progress Notes (Signed)
Subjective:    Patient ID: Eric Vang, male    DOB: 28-Aug-1945, 76 y.o.   MRN: [id number]  DOS:  04/30/2021 Type of visit - description: acute  Patient concerned about a possible UTI. After the last visit to this office, he developed peculiar urine smell. He is using a diaper due to some urinary incontinence that started after a TURBT last month, slightly worse incontinence lately? The tip of the penis seems irritated.  Also urinary urgency and frequency, slightly worse lately?.    Review of Systems  Denies fever chills No nausea or vomiting No suprapubic or flank pain. No gross hematuria, urine is clear/transparent.  Past Medical History:  Diagnosis Date   Anemia    Basal cell carcinoma of nose 2017   removed. a small area right eye   Bladder cancer ([place])    BPH (benign prostatic hyperplasia)    CLUSTER HEADACHE 01/15/2007   2006, 2019   Cluster headaches    Coronary artery disease    DIVERTICULOSIS, COLON 02/04/2008   Elevated PSA    plan possible prostate Bx w/ urology   Erectile dysfunction    Hyperlipidemia    started statin 2015    Inguinal hernia    right   Insomnia    Scrotal varices    Spermatocele of epididymis, single    Unilateral inguinal hernia 2017    Past Surgical History:  Procedure Laterality Date   CARDIAC CATHETERIZATION     > 20 years ago   COLONOSCOPY      x 2 no polyps   COLONOSCOPY W/ POLYPECTOMY  2016 ish   INGUINAL HERNIA REPAIR Right 12/03/2015   Procedure: LAPAROSCOPIC RIGHT INGUINAL HERNIA REPAIR;  Surgeon: [name], MD;  Location: [facility];  Service: General;  Laterality: Right;   INSERTION OF MESH Right 12/03/2015   Procedure: INSERTION OF MESH;  Surgeon: [name], MD;  Location: [facility];  Service: General;  Laterality: Right;   MOHS SURGERY  2017   L nose    RADIOLOGY WITH ANESTHESIA N/A 05/01/2018   Procedure: MRI WITH ANESTHESIA   BRAIN WITH AND WITHOUT;  Surgeon: Radiologist, Medication, MD;  Location: [facility];   Service: Radiology;  Laterality: N/A;    Current Outpatient Medications  Medication Instructions   ketoconazole (NIZORAL) 2 % cream 1 application, Topical, Daily   simvastatin (ZOCOR) 10 mg, Oral, Daily   tamsulosin (FLOMAX) 0.4 mg, Oral, Daily after supper   valACYclovir (VALTREX) 1000 MG tablet TAKE 1 TABLET BY MOUTH TWO (2) TIMES DAILY AS NEEDED FOR COLD SORES. FOR 5 DAYS PER EPISODE       Objective:   Physical Exam BP 132/82 (BP Location: Left Arm, Patient Position: Sitting, Cuff Size: Small)    Pulse 76    Temp 97.6 F (36.4 C) (Oral)    Resp 18    Ht 6\' 1"  (1.854 m)    Wt 201 lb 4 oz (91.3 kg)    SpO2 98%    BMI 26.55 kg/m  General:   Well developed, NAD, BMI noted.  HEENT:  Normocephalic . Face symmetric, atraumatic Abdomen:  Not distended, soft, non-tender. No rebound or rigidity.   Skin: Not pale. Not jaundice Lower extremities: no pretibial edema bilaterally  Neurologic:  alert & oriented X3.  Speech normal, gait appropriate for age and unassisted Psych--  Cognition and judgment appear intact.  Cooperative with normal attention span and concentration.  Behavior appropriate. No anxious or depressed appearing.  Assessment     Assessment Hyperlipidemia , rx statins 2015 Insomnia BCC, nose 2017 Cold sores, prn Valtrex H/o cluster HAs 1 -- 2006. Had  sx again 2019, MRI 04/2018; topamax helped but had s/e (fatigue, mental fog) Elevated PSA 08-2015, UCX (-), s/p abx, saw urology, DRE benign except for mild enlargement, PSA decreased, dx w/  Sx of bladder outlet obstruction Bladder cancer Dx [phone] [place]).  TURBT at [facility] 03/2021  PLAN: UTI?: Recent urological history includes TURBT last month.  Subsequently admission with SBO, UTI treated with Bactrim and discharged home 04/06/2021. Presents today with an unusual urinary smell, penis irritation, urinary incontinence using depends (denies back pain or bowel incontinence).  He is concerned about UTI. On  exam there is indeed some penis irritation but no blisters or ulcers. Plan: UA urine culture, ABX if appropriate Penile dermatitis we will treat with Nizoral (yeast dermatitis?),  See AVS Recommend to discuss other symptoms with urology. Call if severe symptoms, fever or chills.  This visit occurred during the SARS-CoV-2 public health emergency.  Safety protocols were in place, including screening questions prior to the visit, additional usage of staff PPE, and extensive cleaning of exam room while observing appropriate contact time as indicated for disinfecting solutions.

## 2021-05-01 NOTE — Assessment & Plan Note (Signed)
UTI?: Recent urological history includes TURBT last month.  Subsequently admission with SBO, UTI treated with Bactrim and discharged home 04/06/2021. Presents today with an unusual urinary smell, penis irritation, urinary incontinence using depends (denies back pain or bowel incontinence).  He is concerned about UTI. On exam there is indeed some penis irritation but no blisters or ulcers. Plan: UA urine culture, ABX if appropriate Penile dermatitis we will treat with Nizoral (yeast dermatitis?),  See AVS Recommend to discuss other symptoms with urology. Call if severe symptoms, fever or chills.

## 2021-05-03 LAB — URINALYSIS, ROUTINE W REFLEX MICROSCOPIC
Bilirubin Urine: NEGATIVE
Glucose, UA: NEGATIVE
Hgb urine dipstick: NEGATIVE
Hyaline Cast: NONE SEEN /LPF
Ketones, ur: NEGATIVE
Nitrite: NEGATIVE
RBC / HPF: NONE SEEN /HPF (ref 0–2)
Specific Gravity, Urine: 1.02 (ref 1.001–1.035)
Squamous Epithelial / HPF: NONE SEEN /HPF (ref <=5)
pH: 8.5 — ABNORMAL HIGH (ref 5.0–8.0)

## 2021-05-03 LAB — URINE CULTURE
MICRO NUMBER:: 12929943
SPECIMEN QUALITY:: ADEQUATE

## 2021-05-03 LAB — MICROSCOPIC MESSAGE

## 2021-05-03 MED ORDER — SULFAMETHOXAZOLE-TRIMETHOPRIM 800-160 MG PO TABS: 1.0000 | ORAL_TABLET | Freq: Two times a day (BID) | ORAL | 0 refills | Status: DC

## 2021-05-03 NOTE — Addendum Note (Signed)
Addended byDamita Dunnings D on: 05/03/2021 09:27 AM   Modules accepted: Orders

## 2021-05-07 DIAGNOSIS — K7689 Other specified diseases of liver: Secondary | ICD-10-CM | POA: Diagnosis not present

## 2021-05-07 DIAGNOSIS — K769 Liver disease, unspecified: Secondary | ICD-10-CM | POA: Diagnosis not present

## 2021-05-10 ENCOUNTER — Encounter: Payer: Self-pay | Admitting: Internal Medicine

## 2021-05-20 DIAGNOSIS — Z936 Other artificial openings of urinary tract status: Secondary | ICD-10-CM | POA: Diagnosis not present

## 2021-05-20 DIAGNOSIS — C678 Malignant neoplasm of overlapping sites of bladder: Secondary | ICD-10-CM | POA: Diagnosis not present

## 2021-05-20 DIAGNOSIS — G8918 Other acute postprocedural pain: Secondary | ICD-10-CM | POA: Diagnosis not present

## 2021-05-20 DIAGNOSIS — Z87891 Personal history of nicotine dependence: Secondary | ICD-10-CM | POA: Diagnosis not present

## 2021-05-20 DIAGNOSIS — Z8616 Personal history of COVID-19: Secondary | ICD-10-CM | POA: Diagnosis not present

## 2021-05-20 DIAGNOSIS — E785 Hyperlipidemia, unspecified: Secondary | ICD-10-CM | POA: Diagnosis not present

## 2021-05-20 DIAGNOSIS — D630 Anemia in neoplastic disease: Secondary | ICD-10-CM | POA: Diagnosis not present

## 2021-05-20 DIAGNOSIS — K9189 Other postprocedural complications and disorders of digestive system: Secondary | ICD-10-CM | POA: Diagnosis not present

## 2021-05-20 DIAGNOSIS — N400 Enlarged prostate without lower urinary tract symptoms: Secondary | ICD-10-CM | POA: Diagnosis not present

## 2021-05-20 DIAGNOSIS — Z79899 Other long term (current) drug therapy: Secondary | ICD-10-CM | POA: Diagnosis not present

## 2021-05-20 DIAGNOSIS — R69 Illness, unspecified: Secondary | ICD-10-CM | POA: Diagnosis not present

## 2021-05-20 HISTORY — PX: OTHER SURGICAL HISTORY: SHX169

## 2021-05-23 DIAGNOSIS — K9189 Other postprocedural complications and disorders of digestive system: Secondary | ICD-10-CM | POA: Diagnosis not present

## 2021-05-28 DIAGNOSIS — K567 Ileus, unspecified: Secondary | ICD-10-CM | POA: Diagnosis not present

## 2021-05-28 DIAGNOSIS — C678 Malignant neoplasm of overlapping sites of bladder: Secondary | ICD-10-CM | POA: Diagnosis not present

## 2021-05-28 DIAGNOSIS — R2689 Other abnormalities of gait and mobility: Secondary | ICD-10-CM | POA: Diagnosis not present

## 2021-06-05 DIAGNOSIS — Z8551 Personal history of malignant neoplasm of bladder: Secondary | ICD-10-CM | POA: Diagnosis not present

## 2021-06-05 DIAGNOSIS — Z936 Other artificial openings of urinary tract status: Secondary | ICD-10-CM | POA: Diagnosis not present

## 2021-06-08 ENCOUNTER — Ambulatory Visit (INDEPENDENT_AMBULATORY_CARE_PROVIDER_SITE_OTHER): Payer: Medicare HMO

## 2021-06-08 ENCOUNTER — Encounter: Payer: Self-pay | Admitting: Gastroenterology

## 2021-06-08 DIAGNOSIS — Z0000 Encounter for general adult medical examination without abnormal findings: Secondary | ICD-10-CM | POA: Diagnosis not present

## 2021-06-08 DIAGNOSIS — Z1211 Encounter for screening for malignant neoplasm of colon: Secondary | ICD-10-CM

## 2021-06-08 NOTE — Patient Instructions (Signed)
[name] , Thank you for taking time to come for your Medicare Wellness Visit. I appreciate your ongoing commitment to your health goals. Please review the following plan we discussed and let me know if I can assist you in the future.   Screening recommendations/referrals: Colonoscopy: referral placed Recommended yearly ophthalmology/optometry visit for glaucoma screening and checkup Recommended yearly dental visit for hygiene and checkup  Vaccinations: Influenza vaccine: up to date Pneumococcal vaccine: up to date Tdap vaccine: up to date Shingles vaccine: up to date   Covid-19: completed  Advanced directives: Yes, will bring at next visit  Conditions/risks identified: see problem list  Next appointment: Follow up in one year for your annual wellness visit.   Preventive Care 74 Years and Older, Male Preventive care refers to lifestyle choices and visits with your health care provider that can promote health and wellness. What does preventive care include? A yearly physical exam. This is also called an annual well check. Dental exams once or twice a year. Routine eye exams. Ask your health care provider how often you should have your eyes checked. Personal lifestyle choices, including: Daily care of your teeth and gums. Regular physical activity. Eating a healthy diet. Avoiding tobacco and drug use. Limiting alcohol use. Practicing safe sex. Taking low doses of aspirin every day. Taking vitamin and mineral supplements as recommended by your health care provider. What happens during an annual well check? The services and screenings done by your health care provider during your annual well check will depend on your age, overall health, lifestyle risk factors, and family history of disease. Counseling  Your health care provider may ask you questions about your: Alcohol use. Tobacco use. Drug use. Emotional well-being. Home and relationship well-being. Sexual  activity. Eating habits. History of falls. Memory and ability to understand (cognition). Work and work Statistician. Screening  You may have the following tests or measurements: Height, weight, and BMI. Blood pressure. Lipid and cholesterol levels. These may be checked every 5 years, or more frequently if you are over 26 years old. Skin check. Lung cancer screening. You may have this screening every year starting at age 32 if you have a 30-pack-year history of smoking and currently smoke or have quit within the past 15 years. Fecal occult blood test (FOBT) of the stool. You may have this test every year starting at age 44. Flexible sigmoidoscopy or colonoscopy. You may have a sigmoidoscopy every 5 years or a colonoscopy every 10 years starting at age 86. Prostate cancer screening. Recommendations will vary depending on your family history and other risks. Hepatitis C blood test. Hepatitis B blood test. Sexually transmitted disease (STD) testing. Diabetes screening. This is done by checking your blood sugar (glucose) after you have not eaten for a while (fasting). You may have this done every 1-3 years. Abdominal aortic aneurysm (AAA) screening. You may need this if you are a current or former smoker. Osteoporosis. You may be screened starting at age 64 if you are at high risk. Talk with your health care provider about your test results, treatment options, and if necessary, the need for more tests. Vaccines  Your health care provider may recommend certain vaccines, such as: Influenza vaccine. This is recommended every year. Tetanus, diphtheria, and acellular pertussis (Tdap, Td) vaccine. You may need a Td booster every 10 years. Zoster vaccine. You may need this after age 31. Pneumococcal 13-valent conjugate (PCV13) vaccine. One dose is recommended after age 68. Pneumococcal polysaccharide (PPSV23) vaccine. One dose is  recommended after age 55. Talk to your health care provider about which  screenings and vaccines you need and how often you need them. This information is not intended to replace advice given to you by your health care provider. Make sure you discuss any questions you have with your health care provider. Document Released: 04/17/2015 Document Revised: 12/09/2015 Document Reviewed: 01/20/2015 Elsevier Interactive Patient Education  2017 [place] Prevention in the Home Falls can cause injuries. They can happen to people of all ages. There are many things you can do to make your home safe and to help prevent falls. What can I do on the outside of my home? Regularly fix the edges of walkways and driveways and fix any cracks. Remove anything that might make you trip as you walk through a door, such as a raised step or threshold. Trim any bushes or trees on the path to your home. Use bright outdoor lighting. Clear any walking paths of anything that might make someone trip, such as rocks or tools. Regularly check to see if handrails are loose or broken. Make sure that both sides of any steps have handrails. Any raised decks and porches should have guardrails on the edges. Have any leaves, snow, or ice cleared regularly. Use sand or salt on walking paths during winter. Clean up any spills in your garage right away. This includes oil or grease spills. What can I do in the bathroom? Use night lights. Install grab bars by the toilet and in the tub and shower. Do not use towel bars as grab bars. Use non-skid mats or decals in the tub or shower. If you need to sit down in the shower, use a plastic, non-slip stool. Keep the floor dry. Clean up any water that spills on the floor as soon as it happens. Remove soap buildup in the tub or shower regularly. Attach bath mats securely with double-sided non-slip rug tape. Do not have throw rugs and other things on the floor that can make you trip. What can I do in the bedroom? Use night lights. Make sure that you have a  light by your bed that is easy to reach. Do not use any sheets or blankets that are too big for your bed. They should not hang down onto the floor. Have a firm chair that has side arms. You can use this for support while you get dressed. Do not have throw rugs and other things on the floor that can make you trip. What can I do in the kitchen? Clean up any spills right away. Avoid walking on wet floors. Keep items that you use a lot in easy-to-reach places. If you need to reach something above you, use a strong step stool that has a grab bar. Keep electrical cords out of the way. Do not use floor polish or wax that makes floors slippery. If you must use wax, use non-skid floor wax. Do not have throw rugs and other things on the floor that can make you trip. What can I do with my stairs? Do not leave any items on the stairs. Make sure that there are handrails on both sides of the stairs and use them. Fix handrails that are broken or loose. Make sure that handrails are as long as the stairways. Check any carpeting to make sure that it is firmly attached to the stairs. Fix any carpet that is loose or worn. Avoid having throw rugs at the top or bottom of the stairs. If  you do have throw rugs, attach them to the floor with carpet tape. Make sure that you have a light switch at the top of the stairs and the bottom of the stairs. If you do not have them, ask someone to add them for you. What else can I do to help prevent falls? Wear shoes that: Do not have high heels. Have rubber bottoms. Are comfortable and fit you well. Are closed at the toe. Do not wear sandals. If you use a stepladder: Make sure that it is fully opened. Do not climb a closed stepladder. Make sure that both sides of the stepladder are locked into place. Ask someone to hold it for you, if possible. Clearly mark and make sure that you can see: Any grab bars or handrails. First and last steps. Where the edge of each step  is. Use tools that help you move around (mobility aids) if they are needed. These include: Canes. Walkers. Scooters. Crutches. Turn on the lights when you go into a dark area. Replace any light bulbs as soon as they burn out. Set up your furniture so you have a clear path. Avoid moving your furniture around. If any of your floors are uneven, fix them. If there are any pets around you, be aware of where they are. Review your medicines with your doctor. Some medicines can make you feel dizzy. This can increase your chance of falling. Ask your doctor what other things that you can do to help prevent falls. This information is not intended to replace advice given to you by your health care provider. Make sure you discuss any questions you have with your health care provider. Document Released: 01/15/2009 Document Revised: 08/27/2015 Document Reviewed: 04/25/2014 Elsevier Interactive Patient Education  2017 [employer].

## 2021-06-08 NOTE — Progress Notes (Addendum)
Subjective:   Eric Vang is a 76 y.o. male who presents for an Initial Medicare Annual Wellness Visit.  I connected with  Eric Vang on 06/08/21 by a audio enabled telemedicine application and verified that I am speaking with the correct person using two identifiers.  Patient Location: Home  Provider Location: Office/Clinic  I discussed the limitations of evaluation and management by telemedicine. The patient expressed understanding and agreed to proceed.   Review of Systems     Cardiac Risk Factors include: dyslipidemia;advanced age (>24mn, >>32women)     Objective:    There were no vitals filed for this visit. There is no height or weight on file to calculate BMI.  Advanced Directives 06/08/2021 04/04/2021 04/02/2021 03/30/2021 05/01/2018 02/22/2016 02/08/2016  Does Patient Have a Medical Advance Directive? Yes Yes No No No No No  Type of [place];Out of facility DNR (pink MOST or yellow form) [place] Does patient want to make changes to medical advance directive? - No - Patient declined - - - - -  Copy of [place] Chart? No - copy requested No - copy requested - - - - -  Would patient like information on creating a medical advance directive? - No - Patient declined - - No - Patient declined - -    Current Medications (verified) Outpatient Encounter Medications as of 06/08/2021  Medication Sig   simvastatin (ZOCOR) 10 MG tablet Take 1 tablet (10 mg total) by mouth daily. (Patient taking differently: Take 10 mg by mouth every evening.)   valACYclovir (VALTREX) 1000 MG tablet TAKE 1 TABLET BY MOUTH TWO (2) TIMES DAILY AS NEEDED FOR COLD SORES. FOR 5 DAYS PER EPISODE   [DISCONTINUED] ketoconazole (NIZORAL) 2 % cream Apply 1 application topically daily.   [DISCONTINUED] sulfamethoxazole-trimethoprim (BACTRIM DS) 800-160 MG tablet Take 1 tablet by mouth 2 (two)  times daily.   [DISCONTINUED] tamsulosin (FLOMAX) 0.4 MG CAPS capsule Take 1 capsule (0.4 mg total) by mouth daily after supper.   No facility-administered encounter medications on file as of 06/08/2021.    Allergies (verified) Patient has no known allergies.   History: Past Medical History:  Diagnosis Date   Anemia    Basal cell carcinoma of nose 2017   removed. a small area right eye   Bladder cancer ([place]    BPH (benign prostatic hyperplasia)    CLUSTER HEADACHE 01/15/2007   2006, 2019   Cluster headaches    Coronary artery disease    DIVERTICULOSIS, COLON 02/04/2008   Elevated PSA    plan possible prostate Bx w/ urology   Erectile dysfunction    Hyperlipidemia    started statin 2015    Inguinal hernia    right   Insomnia    Scrotal varices    Spermatocele of epididymis, single    Unilateral inguinal hernia 2017   Past Surgical History:  Procedure Laterality Date   bladder removed     CARDIAC CATHETERIZATION     > 20 years ago   COLONOSCOPY      x 2 no polyps   COLONOSCOPY W/ POLYPECTOMY  2016 ish   INGUINAL HERNIA REPAIR Right 12/03/2015   Procedure: LAPAROSCOPIC RIGHT INGUINAL HERNIA REPAIR;  Surgeon: EGreer Pickerel MD;  [place] [facility]  Service: General;  Laterality: Right;   INSERTION OF MESH Right 12/03/2015   Procedure: INSERTION OF MESH;  Surgeon:  [name], MD;  Location: [place];  Service: General;  Laterality: Right;   MOHS SURGERY  2017   L nose    PROSTATE SURGERY     RADIOLOGY WITH ANESTHESIA N/A 05/01/2018   Procedure: MRI WITH ANESTHESIA   BRAIN WITH AND WITHOUT;  Surgeon: Radiologist, Medication, MD;  Location: [place];  Service: Radiology;  Laterality: N/A;   Family History  Problem Relation Age of Onset   Diabetes Mother 83       natural causes.    Atrial fibrillation Mother    Uterine cancer Mother    Cancer Father 80       bone    Cancer Paternal Grandmother        sinus cancer   Colon cancer Neg Hx    Prostate cancer Neg Hx    CAD Neg  Hx    Social History   Socioeconomic History   Marital status: Married    Spouse name: Not on file   Number of children: 1   Years of education: Not on file   Highest education level: Not on file  Occupational History   Occupation: fully retired Scientist, water quality  Tobacco Use   Smoking status: Former    Years: 7.00    Types: Cigarettes   Smokeless tobacco: Never   Tobacco comments:    stopped in 1979, was a light smoker  Vaping Use   Vaping Use: Never used  Substance and Sexual Activity   Alcohol use: Not Currently    Alcohol/week: 0.0 standard drinks   Drug use: No   Sexual activity: Not on file  Other Topics Concern   Not on file  Social History Narrative   Moved from [place] .   Lives home with wife, terri. 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  Caffeine 4 cups daily.     Step daughter is a Therapist, sports   Sister in law [name]    Exercise- walks qd    Social Determinants of Health   Financial Resource Strain: Not on file  Food Insecurity: Not on file  Transportation Needs: Not on file  Physical Activity: Not on file  Stress: Not on file  Social Connections: Not on file    Tobacco Counseling Counseling given: Not Answered Tobacco comments: stopped in 1979, was a light smoker   Clinical Intake:  Pre-visit preparation completed: Yes  Pain : No/denies pain     Nutritional Risks: None Diabetes: No  How often do you need to have someone help you when you read instructions, pamphlets, or other written materials from your doctor or pharmacy?: 1 - Never  Diabetic?No  Interpreter Needed?: No  Information entered by :: [place] of Daily Living In your present state of health, do you have any difficulty performing the following activities: 06/08/2021 04/04/2021  Hearing? N N  Vision? N N  Difficulty concentrating or making decisions? N N  Walking or climbing stairs? N N  Dressing or bathing? N  N  Doing errands, shopping? N N  Preparing Food and eating ? N -  Using the Toilet? N -  In the past six months, have you accidently leaked urine? Y -  Do you have problems with loss of bowel control? N -  Managing your Medications? N -  Managing your Finances? N -  Housekeeping or managing your Housekeeping? N -  Some recent data might be hidden    Patient Care Team:  Colon Branch, MD as PCP - General (Internal Medicine) [name], MD as Consulting Physician (General Surgery) Penni Bombard, MD as Consulting Physician (Neurology) Remi Haggard, MD as Consulting Physician (Urology)  Indicate any recent Medical Services you may have received from other than [facility] providers in the past year (date may be approximate).     Assessment:   This is a routine wellness examination for Eric Vang.  Hearing/Vision screen No results found.  Dietary issues and exercise activities discussed: Current Exercise Habits: Home exercise routine, Type of exercise: walking, Time (Minutes): 60, Frequency (Times/Week): 7, Weekly Exercise (Minutes/Week): 420, Intensity: Mild, Exercise limited by: None identified   Goals Addressed             This Visit's Progress    Patient Stated       Return active levels of pre surgery       Depression Screen PHQ 2/9 Scores 06/08/2021 03/04/2021 11/19/2020 11/15/2019 10/08/2018 08/24/2017 08/12/2015  PHQ - 2 Score 0 0 0 0 0 0 0    Fall Risk Fall Risk  06/08/2021 03/04/2021 11/19/2020 08/24/2017 08/24/2017  Falls in the past year? 0 0 0 No No  Number falls in past yr: 0 0 0 - -  Injury with Fall? 0 0 0 - -  Risk for fall due to : No Fall Risks - - Other (Comment) Other (Comment)  Follow up Falls evaluation completed Falls evaluation completed Falls evaluation completed - -    FALL RISK PREVENTION PERTAINING TO THE HOME:  Any stairs in or around the home? No  If so, are there any without handrails?  N/A Home free of loose throw rugs in walkways, pet beds,  electrical cords, etc? Yes  Adequate lighting in your home to reduce risk of falls? Yes   ASSISTIVE DEVICES UTILIZED TO PREVENT FALLS:  Life alert? No  Use of a cane, walker or w/c? No  Grab bars in the bathroom? No  Shower chair or bench in shower? No  Elevated toilet seat or a handicapped toilet? No   TIMED UP AND GO:  Was the test performed? No .    Cognitive Function:     6CIT Screen 06/08/2021  What Year? 0 points  What month? 0 points  What time? 0 points  Count back from 20 0 points  Months in reverse 0 points  Repeat phrase 0 points  Total Score 0    Immunizations Immunization History  Administered Date(s) Administered   Influenza Split 01/02/2021   Influenza Whole 02/04/2008, 01/02/2009   Influenza, High Dose Seasonal PF 02/19/2015, 12/29/2018   Influenza-Unspecified 01/17/2016, 01/08/2017, 01/22/2018   Moderna Covid-19 Vaccine Bivalent Booster 43yr & up 02/02/2021   Moderna Sars-Covid-2 Vaccination 05/27/2019, 06/25/2019, 01/24/2020, 07/09/2020   Pneumococcal Conjugate-13 08/12/2015   Pneumococcal Polysaccharide-23 08/09/2014   Td 04/04/2002   Tdap 11/12/2014   Zoster Recombinat (Shingrix) 08/02/2016, 10/06/2016   Zoster, Live 04/24/2007    TDAP status: Up to date  Flu Vaccine status: Up to date  Pneumococcal vaccine status: Up to date  Covid-19 vaccine status: Completed vaccines  Qualifies for Shingles Vaccine? Yes  Zostavax completed No   Shingrix Completed?: Yes  Screening Tests Health Maintenance  Topic Date Due   COLONOSCOPY (Pts 45-420yrInsurance coverage will need to be confirmed)  04/30/2022 (Originally 02/21/2021)   TETANUS/TDAP  11/11/2024   Pneumonia Vaccine 6538Years old  Completed   INFLUENZA VACCINE  Completed   COVID-19 Vaccine  Completed   Hepatitis C Screening  Completed   Zoster Vaccines- Shingrix  Completed   HPV VACCINES  Aged Out    Health Maintenance  There are no preventive care reminders to display for this  patient.  Colorectal cancer screening: Referral to GI placed 06/08/21. Pt aware the office will call re: appt.  Lung Cancer Screening: (Low Dose CT Chest recommended if Age 64-80 years, 30 pack-year currently smoking OR have quit w/in 15years.) does not qualify.   Lung Cancer Screening Referral: N/A  Additional Screening:  Hepatitis C Screening: does qualify; Completed 08/17/16  Vision Screening: Recommended annual ophthalmology exams for early detection of glaucoma and other disorders of the eye. Is the patient up to date with their annual eye exam?  Yes  Who is the provider or what is the name of the office in which the patient attends annual eye exams? [facility] If pt is not established with a provider, would they like to be referred to a provider to establish care? No .   Dental Screening: Recommended annual dental exams for proper oral hygiene  Community Resource Referral / Chronic Care Management: CRR required this visit?  No   CCM required this visit?  No      Plan:     I have personally reviewed and noted the following in the patients chart:   Medical and social history Use of alcohol, tobacco or illicit drugs  Current medications and supplements including opioid prescriptions. Patient is not currently taking opioid prescriptions. Functional ability and status Nutritional status Physical activity Advanced directives List of other physicians Hospitalizations, surgeries, and ER visits in previous 12 months Vitals Screenings to include cognitive, depression, and falls Referrals and appointments  In addition, I have reviewed and discussed with patient certain preventive protocols, quality metrics, and best practice recommendations. A written personalized care plan for preventive services as well as general preventive health recommendations were provided to patient.     Duard Brady Raynold Blankenbaker, [place]   06/08/2021   Nurse Notes: colonoscopy referral placed  I have  reviewed and agree with [facility] documentation.  Kathlene November, MD

## 2021-06-11 DIAGNOSIS — C678 Malignant neoplasm of overlapping sites of bladder: Secondary | ICD-10-CM | POA: Diagnosis not present

## 2021-06-11 DIAGNOSIS — Z87891 Personal history of nicotine dependence: Secondary | ICD-10-CM | POA: Diagnosis not present

## 2021-06-22 DIAGNOSIS — D2262 Melanocytic nevi of left upper limb, including shoulder: Secondary | ICD-10-CM | POA: Diagnosis not present

## 2021-06-22 DIAGNOSIS — L821 Other seborrheic keratosis: Secondary | ICD-10-CM | POA: Diagnosis not present

## 2021-06-22 DIAGNOSIS — D1801 Hemangioma of skin and subcutaneous tissue: Secondary | ICD-10-CM | POA: Diagnosis not present

## 2021-06-22 DIAGNOSIS — D2371 Other benign neoplasm of skin of right lower limb, including hip: Secondary | ICD-10-CM | POA: Diagnosis not present

## 2021-06-22 DIAGNOSIS — D2261 Melanocytic nevi of right upper limb, including shoulder: Secondary | ICD-10-CM | POA: Diagnosis not present

## 2021-06-22 DIAGNOSIS — Z85828 Personal history of other malignant neoplasm of skin: Secondary | ICD-10-CM | POA: Diagnosis not present

## 2021-07-05 DIAGNOSIS — Z8551 Personal history of malignant neoplasm of bladder: Secondary | ICD-10-CM | POA: Diagnosis not present

## 2021-07-05 DIAGNOSIS — Z936 Other artificial openings of urinary tract status: Secondary | ICD-10-CM | POA: Diagnosis not present

## 2021-07-21 ENCOUNTER — Telehealth: Payer: Self-pay | Admitting: Internal Medicine

## 2021-07-21 MED ORDER — SIMVASTATIN 10 MG PO TABS: 10.0000 mg | ORAL_TABLET | Freq: Every day | ORAL | 1 refills | Status: DC

## 2021-07-21 NOTE — Telephone Encounter (Signed)
Rx sent 

## 2021-07-21 NOTE — Telephone Encounter (Signed)
Pt stated he did not think he had an refills left at his pharmacy. It does look like in our system there was a refill remaining. Please advise.  ? ?Medication: simvastatin (ZOCOR) 10 MG tablet ? ?Has the patient contacted their pharmacy? Yes.   ? ? ?Preferred Pharmacy:  ?[facility] [place] - [address]  ? [address] [place] [address]  ?Phone:  [phone]  Fax:  [phone] ?

## 2021-08-05 DIAGNOSIS — Z8551 Personal history of malignant neoplasm of bladder: Secondary | ICD-10-CM | POA: Diagnosis not present

## 2021-08-05 DIAGNOSIS — Z936 Other artificial openings of urinary tract status: Secondary | ICD-10-CM | POA: Diagnosis not present

## 2021-08-19 ENCOUNTER — Encounter: Payer: Self-pay | Admitting: Internal Medicine

## 2021-08-20 MED ORDER — VALACYCLOVIR HCL 1 G PO TABS: ORAL_TABLET | ORAL | 3 refills | Status: DC

## 2021-09-03 ENCOUNTER — Ambulatory Visit (INDEPENDENT_AMBULATORY_CARE_PROVIDER_SITE_OTHER): Payer: Medicare HMO | Admitting: Internal Medicine

## 2021-09-03 ENCOUNTER — Encounter: Payer: Self-pay | Admitting: Internal Medicine

## 2021-09-03 VITALS — BP 132/84 | HR 79 | Temp 98.0°F | Resp 16 | Ht 73.0 in | Wt 200.2 lb

## 2021-09-03 DIAGNOSIS — E785 Hyperlipidemia, unspecified: Secondary | ICD-10-CM

## 2021-09-03 DIAGNOSIS — C679 Malignant neoplasm of bladder, unspecified: Secondary | ICD-10-CM

## 2021-09-03 NOTE — Progress Notes (Unsigned)
Subjective:    Patient ID: Eric Vang, male    DOB: 1946-03-16, 76 y.o.   MRN: [id number]  DOS:  09/03/2021 Type of visit - description: Follow-up  Since the last office visit, had a radical cystectomy including a prostatectomy 05/2020. He is doing remarkably well.  Review of Systems See above   Past Medical History:  Diagnosis Date   Anemia    Basal cell carcinoma of nose 2017   removed. a small area right eye   Bladder cancer ([place])    BPH (benign prostatic hyperplasia)    CLUSTER HEADACHE 01/15/2007   2006, 2019   Cluster headaches    Coronary artery disease    DIVERTICULOSIS, COLON 02/04/2008   Elevated PSA    plan possible prostate Bx w/ urology   Erectile dysfunction    Hyperlipidemia    started statin 2015    Inguinal hernia    right   Insomnia    Scrotal varices    Spermatocele of epididymis, single    Unilateral inguinal hernia 2017    Past Surgical History:  Procedure Laterality Date   bladder removed     CARDIAC CATHETERIZATION     > 20 years ago   COLONOSCOPY      x 2 no polyps   COLONOSCOPY W/ POLYPECTOMY  2016 ish   INGUINAL HERNIA REPAIR Right 12/03/2015   Procedure: LAPAROSCOPIC RIGHT INGUINAL HERNIA REPAIR;  Surgeon: [name], MD;  Location: [place];  Service: General;  Laterality: Right;   INSERTION OF MESH Right 12/03/2015   Procedure: INSERTION OF MESH;  Surgeon: [name], MD;  Location: [place];  Service: General;  Laterality: Right;   MOHS SURGERY  2017   L nose    PROSTATE SURGERY     RADIOLOGY WITH ANESTHESIA N/A 05/01/2018   Procedure: MRI WITH ANESTHESIA   BRAIN WITH AND WITHOUT;  Surgeon: Radiologist, Medication, MD;  Location: [place];  Service: Radiology;  Laterality: N/A;    Current Outpatient Medications  Medication Instructions   simvastatin (ZOCOR) 10 mg, Oral, Daily   valACYclovir (VALTREX) 1000 MG tablet TAKE 1 TABLET BY MOUTH TWO (2) TIMES DAILY AS NEEDED FOR COLD SORES. FOR 5 DAYS PER EPISODE        Objective:   Physical Exam BP 132/84   Pulse 79   Temp 98 F (36.7 C) (Oral)   Resp 16   Ht '6\' 1"'$  (1.854 m)   Wt 200 lb 4 oz (90.8 kg)   SpO2 97%   BMI 26.42 kg/m  General:   Well developed, NAD, BMI noted. HEENT:  Normocephalic . Face symmetric, atraumatic Lungs:  CTA B Normal respiratory effort, no intercostal retractions, no accessory muscle use. Heart: RRR,  no murmur.  Lower extremities: no pretibial edema bilaterally  Skin: Not pale. Not jaundice Neurologic:  alert & oriented X3.  Speech normal, gait appropriate for age and unassisted Psych--  Cognition and judgment appear intact.  Cooperative with normal attention span and concentration.  Behavior appropriate. No anxious or depressed appearing.      Assessment     Assessment Hyperlipidemia , rx statins 2015 Insomnia BCC, nose 2017 Cold sores, prn Valtrex H/o cluster HAs 1 -- 2006. Had  sx again 2019, MRI 04/2018; topamax helped but had s/e (fatigue, mental fog) Elevated PSA 08-2015, UCX (-), s/p abx, saw urology, DRE benign except for mild enlargement, PSA decreased, dx w/  Sx of bladder outlet obstruction Bladder cancer Dx [phone] [place]).  TURBT at [facility] 03/2021.  Radical cystectomy 05/20/2018  PLAN: Bladder cancer, status post radical cystectomy/prostatectomy 4 months ago, doing remarkably well.  Has a follow-up with urology in 2 weeks. After the procedure CMP and CBC were okay. The patient is extremely happy with the outcome.  His chances for recurrence are around 10%. Hyperlipidemia: On statins RTC 12-2021 CPE

## 2021-09-03 NOTE — Patient Instructions (Signed)
    GO TO THE FRONT DESK, PLEASE SCHEDULE YOUR APPOINTMENTS Come back for   a physical by September 2023

## 2021-09-04 NOTE — Assessment & Plan Note (Signed)
Bladder cancer, status post radical cystectomy/prostatectomy 4 months ago, doing remarkably well.  Has a follow-up with urology in 2 weeks. After the procedure CMP and CBC were okay. The patient is extremely happy with the outcome.  His chances for recurrence are around 10%. Hyperlipidemia: On statins RTC 12-2021 CPE

## 2021-09-06 DIAGNOSIS — Z8551 Personal history of malignant neoplasm of bladder: Secondary | ICD-10-CM | POA: Diagnosis not present

## 2021-09-06 DIAGNOSIS — Z936 Other artificial openings of urinary tract status: Secondary | ICD-10-CM | POA: Diagnosis not present

## 2021-09-29 DIAGNOSIS — Z85828 Personal history of other malignant neoplasm of skin: Secondary | ICD-10-CM | POA: Diagnosis not present

## 2021-09-29 DIAGNOSIS — Z8616 Personal history of COVID-19: Secondary | ICD-10-CM | POA: Diagnosis not present

## 2021-09-29 DIAGNOSIS — Z906 Acquired absence of other parts of urinary tract: Secondary | ICD-10-CM | POA: Diagnosis not present

## 2021-09-29 DIAGNOSIS — Z9221 Personal history of antineoplastic chemotherapy: Secondary | ICD-10-CM | POA: Diagnosis not present

## 2021-09-29 DIAGNOSIS — R69 Illness, unspecified: Secondary | ICD-10-CM | POA: Diagnosis not present

## 2021-09-29 DIAGNOSIS — C678 Malignant neoplasm of overlapping sites of bladder: Secondary | ICD-10-CM | POA: Diagnosis not present

## 2021-09-29 DIAGNOSIS — Z87891 Personal history of nicotine dependence: Secondary | ICD-10-CM | POA: Diagnosis not present

## 2021-09-29 DIAGNOSIS — Z08 Encounter for follow-up examination after completed treatment for malignant neoplasm: Secondary | ICD-10-CM | POA: Diagnosis not present

## 2021-09-29 DIAGNOSIS — Z8551 Personal history of malignant neoplasm of bladder: Secondary | ICD-10-CM | POA: Diagnosis not present

## 2021-10-06 DIAGNOSIS — Z936 Other artificial openings of urinary tract status: Secondary | ICD-10-CM | POA: Diagnosis not present

## 2021-10-06 DIAGNOSIS — Z8551 Personal history of malignant neoplasm of bladder: Secondary | ICD-10-CM | POA: Diagnosis not present

## 2021-10-07 ENCOUNTER — Encounter: Payer: Self-pay | Admitting: Internal Medicine

## 2021-10-27 ENCOUNTER — Ambulatory Visit (INDEPENDENT_AMBULATORY_CARE_PROVIDER_SITE_OTHER): Payer: Medicare HMO | Admitting: Internal Medicine

## 2021-10-27 ENCOUNTER — Encounter: Payer: Self-pay | Admitting: Internal Medicine

## 2021-10-27 VITALS — BP 114/68 | HR 72 | Temp 98.2°F | Resp 18 | Ht 73.0 in | Wt 202.2 lb

## 2021-10-27 DIAGNOSIS — J302 Other seasonal allergic rhinitis: Secondary | ICD-10-CM | POA: Diagnosis not present

## 2021-10-27 DIAGNOSIS — J40 Bronchitis, not specified as acute or chronic: Secondary | ICD-10-CM

## 2021-10-27 MED ORDER — AZITHROMYCIN 250 MG PO TABS: ORAL_TABLET | ORAL | 0 refills | Status: DC

## 2021-10-27 NOTE — Patient Instructions (Addendum)
  For allergies: - Get over-the-counter Astepro nasal spray: 2 sprays on each side of the nose twice daily until better - Allegra 60 mg 1 tablet twice daily as needed until better  Zithromax, an antibiotic for 5 days  Mucinex DM or Robitussin-DM as needed for cough  Call if not gradually better. Call if not back to normal in 2 weeks.   Please drop off or mail [place] a copy of your [place].

## 2021-10-27 NOTE — Assessment & Plan Note (Signed)
allergies versus atypical bronchitis?Marland Kitchen   On clinical grounds is hard to say which of the above is the etiology of his symptoms. Plan: Wendie Chess, Mucinex DM. Zithromax for 5 days. Call if not gradually better

## 2021-10-27 NOTE — Progress Notes (Signed)
Subjective:    Patient ID: Eric Vang, male    DOB: [birth year], 76 y.o.   MRN: [id number]  DOS:  10/27/2021 Type of visit - description: Acute  Symptoms started approximately 2 to 3 weeks ago: Runny nose with clear nasal discharge, cough with very little sputum. Had 2 COVID tests: Both are negative. Has used Zyrtec without much relief. Overall, he did not feel that he had a URI with the onset of symptoms  No fever chills No itchy eyes or itchy nose. No sneezing. No sinus pain. No chest pain or difficulty breathing.  No edema. No GERD. When asked admits to some chest congestion and occasional wheezing.   Review of Systems See above   Past Medical History:  Diagnosis Date   Anemia    Basal cell carcinoma of nose 2017   removed. a small area right eye   Bladder cancer ([place])    BPH (benign prostatic hyperplasia)    CLUSTER HEADACHE 01/15/2007   2006, 2019   Cluster headaches    Coronary artery disease    DIVERTICULOSIS, COLON 02/04/2008   Elevated PSA    plan possible prostate Bx w/ urology   Erectile dysfunction    Hyperlipidemia    started statin 2015    Inguinal hernia    right   Insomnia    Scrotal varices    Spermatocele of epididymis, single    Unilateral inguinal hernia 2017    Past Surgical History:  Procedure Laterality Date   bladder removed     CARDIAC CATHETERIZATION     > 20 years ago   COLONOSCOPY      x 2 no polyps   COLONOSCOPY W/ POLYPECTOMY  2016 ish   INGUINAL HERNIA REPAIR Right 12/03/2015   Procedure: LAPAROSCOPIC RIGHT INGUINAL HERNIA REPAIR;  Surgeon: [name], MD;  Location: [place];  Service: General;  Laterality: Right;   INSERTION OF MESH Right 12/03/2015   Procedure: INSERTION OF MESH;  Surgeon: [name], MD;  Location: [place];  Service: General;  Laterality: Right;   MOHS SURGERY  2017   L nose    PROSTATE SURGERY     radical cystectomy (bladder, prostate) Bilateral 05/20/2021   @ [facility]   RADIOLOGY WITH  ANESTHESIA N/A 05/01/2018   Procedure: MRI WITH ANESTHESIA   BRAIN WITH AND WITHOUT;  Surgeon: Radiologist, Medication, MD;  Location: [place];  Service: Radiology;  Laterality: N/A;    Current Outpatient Medications  Medication Instructions   simvastatin (ZOCOR) 10 mg, Oral, Daily   valACYclovir (VALTREX) 1000 MG tablet TAKE 1 TABLET BY MOUTH TWO (2) TIMES DAILY AS NEEDED FOR COLD SORES. FOR 5 DAYS PER EPISODE       Objective:   Physical Exam BP 114/68   Pulse 72   Temp 98.2 F (36.8 C) (Oral)   Resp 18   Ht '6\' 1"'$  (1.854 m)   Wt 202 lb 4 oz (91.7 kg)   SpO2 96%   BMI 26.68 kg/m  General:   Well developed, NAD, BMI noted. HEENT:  Normocephalic . Face symmetric, atraumatic Nose: Slightly congested, TMs slightly bulged but not red.  Sinuses non-TTP Throat: Symmetric, normal rate. Lungs:  Few rhonchi with cough, no wheezing, no crackles Normal respiratory effort, no intercostal retractions, no accessory muscle use. Heart: RRR,  no murmur.  Lower extremities: no pretibial edema bilaterally  Skin: Not pale. Not jaundice Neurologic:  alert & oriented X3.  Speech normal, gait appropriate for age and unassisted  Psych--  Cognition and judgment appear intact.  Cooperative with normal attention span and concentration.  Behavior appropriate. No anxious or depressed appearing.      Assessment     Assessment Hyperlipidemia , rx statins 2015 Insomnia BCC, nose 2017 Cold sores, prn Valtrex H/o cluster HAs 1 -- 2006. Had  sx again 2019, MRI 04/2018; topamax helped but had s/e (fatigue, mental fog) Elevated PSA 08-2015, UCX (-), s/p abx, saw urology, DRE benign except for mild enlargement, PSA decreased, dx w/  Sx of bladder outlet obstruction Bladder cancer Dx [phone] [place]).  [facility]URBT at [facility] 03/2021.  Radical cystectomy 05/20/2018  PLAN: allergies versus atypical bronchitis?Marland Kitchen   On clinical grounds is hard to say which of the above is the etiology of his  symptoms. Plan: Wendie Chess, Mucinex DM. Zithromax for 5 days. Call if not gradually better

## 2021-10-29 DIAGNOSIS — Z8551 Personal history of malignant neoplasm of bladder: Secondary | ICD-10-CM | POA: Diagnosis not present

## 2021-10-29 DIAGNOSIS — Z936 Other artificial openings of urinary tract status: Secondary | ICD-10-CM | POA: Diagnosis not present

## 2021-11-06 DIAGNOSIS — Z936 Other artificial openings of urinary tract status: Secondary | ICD-10-CM | POA: Diagnosis not present

## 2021-11-06 DIAGNOSIS — Z8551 Personal history of malignant neoplasm of bladder: Secondary | ICD-10-CM | POA: Diagnosis not present

## 2021-11-23 DIAGNOSIS — Z809 Family history of malignant neoplasm, unspecified: Secondary | ICD-10-CM | POA: Diagnosis not present

## 2021-11-23 DIAGNOSIS — Z8551 Personal history of malignant neoplasm of bladder: Secondary | ICD-10-CM | POA: Diagnosis not present

## 2021-11-23 DIAGNOSIS — Z87891 Personal history of nicotine dependence: Secondary | ICD-10-CM | POA: Diagnosis not present

## 2021-11-23 DIAGNOSIS — Z9350 Unspecified cystostomy status: Secondary | ICD-10-CM | POA: Diagnosis not present

## 2021-11-23 DIAGNOSIS — Z008 Encounter for other general examination: Secondary | ICD-10-CM | POA: Diagnosis not present

## 2021-11-23 DIAGNOSIS — E785 Hyperlipidemia, unspecified: Secondary | ICD-10-CM | POA: Diagnosis not present

## 2021-11-23 DIAGNOSIS — Z8673 Personal history of transient ischemic attack (TIA), and cerebral infarction without residual deficits: Secondary | ICD-10-CM | POA: Diagnosis not present

## 2021-11-23 DIAGNOSIS — N529 Male erectile dysfunction, unspecified: Secondary | ICD-10-CM | POA: Diagnosis not present

## 2021-12-07 DIAGNOSIS — Z936 Other artificial openings of urinary tract status: Secondary | ICD-10-CM | POA: Diagnosis not present

## 2021-12-07 DIAGNOSIS — Z8551 Personal history of malignant neoplasm of bladder: Secondary | ICD-10-CM | POA: Diagnosis not present

## 2021-12-20 ENCOUNTER — Encounter: Payer: Self-pay | Admitting: Internal Medicine

## 2021-12-23 ENCOUNTER — Encounter: Payer: Self-pay | Admitting: Internal Medicine

## 2021-12-23 ENCOUNTER — Ambulatory Visit (HOSPITAL_BASED_OUTPATIENT_CLINIC_OR_DEPARTMENT_OTHER)
Admission: RE | Admit: 2021-12-23 | Discharge: 2021-12-23 | Disposition: A | Discharge status: Home / Self Care | Payer: Medicare HMO | Source: Ambulatory Visit | Attending: Internal Medicine | Admitting: Internal Medicine

## 2021-12-23 ENCOUNTER — Ambulatory Visit (INDEPENDENT_AMBULATORY_CARE_PROVIDER_SITE_OTHER): Payer: Medicare HMO | Admitting: Internal Medicine

## 2021-12-23 VITALS — BP 126/80 | HR 67 | Temp 97.6°F | Resp 18 | Ht 73.0 in | Wt 206.4 lb

## 2021-12-23 DIAGNOSIS — M7989 Other specified soft tissue disorders: Secondary | ICD-10-CM | POA: Insufficient documentation

## 2021-12-23 NOTE — Progress Notes (Signed)
Subjective:    Patient ID: Eric Vang, male    DOB: 1946/02/25, 76 y.o.   MRN: [id number]  DOS:  12/23/2021 Type of visit - description: Acute  His main concern is that the left foot and ankle has been swelling on and off for the last week. Denies any injury, no discoloration.  No pain except when he wears a  specific pair shoes  that cause left toe discomfort   Last month, had a PAD screening and it came back borderline + on the left and he is concerned about that.  He specifically denies any pain in the calf or claudication. No recent airplane trip or prolonged car trip  Review of Systems See above   Past Medical History:  Diagnosis Date   Anemia    Basal cell carcinoma of nose 2017   removed. a small area right eye   Bladder cancer ([place])    BPH (benign prostatic hyperplasia)    CLUSTER HEADACHE 01/15/2007   2006, 2019   Cluster headaches    Coronary artery disease    DIVERTICULOSIS, COLON 02/04/2008   Elevated PSA    plan possible prostate Bx w/ urology   Erectile dysfunction    Hyperlipidemia    started statin 2015    Inguinal hernia    right   Insomnia    Scrotal varices    Spermatocele of epididymis, single    Unilateral inguinal hernia 2017    Past Surgical History:  Procedure Laterality Date   bladder removed     CARDIAC CATHETERIZATION     > 20 years ago   COLONOSCOPY      x 2 no polyps   COLONOSCOPY W/ POLYPECTOMY  2016 ish   INGUINAL HERNIA REPAIR Right 12/03/2015   Procedure: LAPAROSCOPIC RIGHT INGUINAL HERNIA REPAIR;  Surgeon: [name], MD;  Location: [facility];  Service: General;  Laterality: Right;   INSERTION OF MESH Right 12/03/2015   Procedure: INSERTION OF MESH;  Surgeon: [name], MD;  Location: [facility];  Service: General;  Laterality: Right;   MOHS SURGERY  2017   L nose    PROSTATE SURGERY     radical cystectomy (bladder, prostate) Bilateral 05/20/2021   @ [facility]   RADIOLOGY WITH ANESTHESIA N/A 05/01/2018   Procedure: MRI  WITH ANESTHESIA   BRAIN WITH AND WITHOUT;  Surgeon: Radiologist, Medication, MD;  Location: [facility];  Service: Radiology;  Laterality: N/A;    Current Outpatient Medications  Medication Instructions   azithromycin (ZITHROMAX) 250 MG tablet Take 2 tablets by mouth first day, then 1 tablet for 4 additional days   simvastatin (ZOCOR) 10 mg, Oral, Daily   valACYclovir (VALTREX) 1000 MG tablet TAKE 1 TABLET BY MOUTH TWO (2) TIMES DAILY AS NEEDED FOR COLD SORES. FOR 5 DAYS PER EPISODE       Objective:   Physical Exam BP 126/80   Pulse 67   Temp 97.6 F (36.4 C) (Oral)   Resp 18   Ht '6\' 1"'$  (1.854 m)   Wt 206 lb 6 oz (93.6 kg)   SpO2 97%   BMI 27.23 kg/m  General:   Well developed, NAD, BMI noted. HEENT:  Normocephalic . Face symmetric, atraumatic Lower extremities: Calves: No TTP, no swollen, measured in the R side is a third of a inch larger in sitting conference. Right foot and ankle: Normal, good pedal pulse, no swelling, great toenail slightly thick Left foot and ankle: Mild swelling from the ankle down.  Good pedal pulse, great toenail slightly thick.  Normal capillary refill Skin: Not pale. Not jaundice Neurologic:  alert & oriented X3.  Speech normal, gait appropriate for age and unassisted Psych--  Cognition and judgment appear intact.  Cooperative with normal attention span and concentration.  Behavior appropriate. No anxious or depressed appearing.      Assessment     Assessment Hyperlipidemia , rx statins 2015 Insomnia BCC, nose 2017 Cold sores, prn Valtrex H/o cluster HAs 1 -- 2006. Had  sx again 2019, MRI 04/2018; topamax helped but had s/e (fatigue, mental fog) Elevated PSA 08-2015, UCX (-), s/p abx, saw urology, DRE benign except for mild enlargement, PSA decreased, dx w/  Sx of bladder outlet obstruction Bladder cancer Dx [id number] [facility] [place]).  TURBT at [facility] 03/2021.  Radical cystectomy 05/20/2018  PLAN: Left ankle and foot swelling: Unclear etiology,  not classic for gout given lack of pain and warmness. Recent screening from the insurance company show possible borderline PAD on the left lower extremity however he has no claudication and very good pulses and capillary refill. Given swelling will get [place] to rule out DVT, otherwise recommend leg elevation.  If swelling continue we will get x-rays for possible occult fracture which is unlikely. At some point, if he develops symptoms we will do a formal ABI

## 2021-12-23 NOTE — Patient Instructions (Addendum)
Please go to the first floor and schedule ultrasound of your left leg.  Keep the leg elevated an hour twice daily.  If you are not gradually better let me know

## 2021-12-24 NOTE — Assessment & Plan Note (Signed)
Left ankle and foot swelling: Unclear etiology, not classic for gout given lack of pain and warmness. Recent screening from the insurance company show possible borderline PAD on the left lower extremity however he has no claudication and very good pulses and capillary refill. Given swelling will get [place] to rule out DVT, otherwise recommend leg elevation.  If swelling continue we will get x-rays for possible occult fracture which is unlikely. At some point, if he develops symptoms we will do a formal ABI

## 2021-12-27 NOTE — Progress Notes (Signed)
[facility])                                            [place] Team                                        Statin Quality Measure Assessment    12/27/2021  JENNA ARDOIN III 1945-06-07 [id number]  Per review of chart and payor information, this patient has been flagged for non-adherence to the following CMS Quality Measure:   '[]'$  Statin Use in Persons with Diabetes  '[x]'$  Statin Use in Persons with Cardiovascular Disease --- Per payor claims data, patient have a diagnosis of Atherosclerotic Heart Disease Of Native Coronary Artery Without Angina Pectoris (ICD I25.10) on 04/04/2021. Simvastatin 10 mg daily on file. Mr. Grillo might be due for lipid check. If deemed clinically appropriate, please consider moderate to high-intensity statin pending updated labs. If patient has an undocumented statin intolerance, please associate exclusion code (see options below) at the next office visit on 01/28/2022.   The 10-year ASCVD risk score (Arnett DK, et al., 2019) is: 18.2%   Values used to calculate the score:     Age: 6 years     Sex: Male     Is Non-Hispanic African American: No     Diabetic: No     Tobacco smoker: No     Systolic Blood Pressure: 229 mmHg     Is BP treated: No     HDL Cholesterol: 77 mg/dL     Total Cholesterol: 160 mg/dL   Please consider ONE of the following recommendations:   Initiate high intensity statin Atorvastatin '40mg'$  once daily, #90, 3 refills   Rosuvastatin '20mg'$  once daily, #90, 3 refills    Initiate moderate intensity          statin with reduced frequency if prior          statin intolerance 1x weekly, #13, 3 refills   2x weekly, #26, 3 refills   3x weekly, #39, 3 refills   Code for past statin intolerance or other exclusions (required annually)  Drug Induced Myopathy G72.0   Myositis, unspecified M60.9   Rhabdomyolysis M62.82   Cirrhosis of liver K74.69   Biliary cirrhosis,  unspecified K74.5   Abnormal blood glucose - for SUPD ONLY R73.09   Prediabetes - for SUPD ONLY  R73.03   Thank you for your time,  Kristeen Miss, [place] Cell: [phone]

## 2021-12-29 ENCOUNTER — Encounter: Payer: Medicare HMO | Admitting: Internal Medicine

## 2022-01-06 ENCOUNTER — Other Ambulatory Visit: Payer: Self-pay | Admitting: Internal Medicine

## 2022-01-06 DIAGNOSIS — Z936 Other artificial openings of urinary tract status: Secondary | ICD-10-CM | POA: Diagnosis not present

## 2022-01-06 DIAGNOSIS — Z8551 Personal history of malignant neoplasm of bladder: Secondary | ICD-10-CM | POA: Diagnosis not present

## 2022-01-07 DIAGNOSIS — Z936 Other artificial openings of urinary tract status: Secondary | ICD-10-CM | POA: Diagnosis not present

## 2022-01-07 DIAGNOSIS — Z8551 Personal history of malignant neoplasm of bladder: Secondary | ICD-10-CM | POA: Diagnosis not present

## 2022-01-12 DIAGNOSIS — Z8551 Personal history of malignant neoplasm of bladder: Secondary | ICD-10-CM | POA: Diagnosis not present

## 2022-01-12 DIAGNOSIS — Z936 Other artificial openings of urinary tract status: Secondary | ICD-10-CM | POA: Diagnosis not present

## 2022-01-28 ENCOUNTER — Ambulatory Visit (INDEPENDENT_AMBULATORY_CARE_PROVIDER_SITE_OTHER): Payer: Medicare HMO | Admitting: Internal Medicine

## 2022-01-28 ENCOUNTER — Encounter: Payer: Self-pay | Admitting: Internal Medicine

## 2022-01-28 VITALS — BP 124/80 | HR 74 | Temp 97.6°F | Resp 18 | Ht 73.0 in | Wt 208.2 lb

## 2022-01-28 DIAGNOSIS — E785 Hyperlipidemia, unspecified: Secondary | ICD-10-CM | POA: Diagnosis not present

## 2022-01-28 DIAGNOSIS — R7989 Other specified abnormal findings of blood chemistry: Secondary | ICD-10-CM | POA: Diagnosis not present

## 2022-01-28 DIAGNOSIS — D649 Anemia, unspecified: Secondary | ICD-10-CM

## 2022-01-28 DIAGNOSIS — Z0000 Encounter for general adult medical examination without abnormal findings: Secondary | ICD-10-CM | POA: Diagnosis not present

## 2022-01-28 LAB — LIPID PANEL
Cholesterol: 154 mg/dL (ref 0–200)
HDL: 65.1 mg/dL (ref >39.00)
LDL Cholesterol: 71 mg/dL (ref 0–99)
NonHDL: 89.07
Total CHOL/HDL Ratio: 2
Triglycerides: 90 mg/dL (ref 0.0–149.0)
VLDL: 18 mg/dL (ref 0.0–40.0)

## 2022-01-28 LAB — CBC WITH DIFFERENTIAL/PLATELET
Basophils Absolute: 0 10*3/uL (ref 0.0–0.1)
Basophils Relative: 0.9 % (ref 0.0–3.0)
Eosinophils Absolute: 0.2 10*3/uL (ref 0.0–0.7)
Eosinophils Relative: 4.2 % (ref 0.0–5.0)
HCT: 43.4 % (ref 39.0–52.0)
Hemoglobin: 14.9 g/dL (ref 13.0–17.0)
Lymphocytes Relative: 29.7 % (ref 12.0–46.0)
Lymphs Abs: 1.4 10*3/uL (ref 0.7–4.0)
MCHC: 34.4 g/dL (ref 30.0–36.0)
MCV: 97 fl (ref 78.0–100.0)
Monocytes Absolute: 0.4 10*3/uL (ref 0.1–1.0)
Monocytes Relative: 9.3 % (ref 3.0–12.0)
Neutro Abs: 2.6 10*3/uL (ref 1.4–7.7)
Neutrophils Relative %: 55.9 % (ref 43.0–77.0)
Platelets: 257 10*3/uL (ref 150.0–400.0)
RBC: 4.47 Mil/uL (ref 4.22–5.81)
RDW: 13.1 % (ref 11.5–15.5)
WBC: 4.6 10*3/uL (ref 4.0–10.5)

## 2022-01-28 LAB — COMPREHENSIVE METABOLIC PANEL
ALT: 24 U/L (ref 0–53)
AST: 22 U/L (ref 0–37)
Albumin: 4.3 g/dL (ref 3.5–5.2)
Alkaline Phosphatase: 70 U/L (ref 39–117)
BUN: 17 mg/dL (ref 6–23)
CO2: 27 mEq/L (ref 19–32)
Calcium: 9.3 mg/dL (ref 8.4–10.5)
Chloride: 106 mEq/L (ref 96–112)
Creatinine, Ser: 0.85 mg/dL (ref 0.40–1.50)
GFR: 85.51 mL/min (ref >60.00)
Glucose, Bld: 87 mg/dL (ref 70–99)
Potassium: 4.2 mEq/L (ref 3.5–5.1)
Sodium: 140 mEq/L (ref 135–145)
Total Bilirubin: 1 mg/dL (ref 0.2–1.2)
Total Protein: 6.4 g/dL (ref 6.0–8.3)

## 2022-01-28 LAB — MICROALBUMIN / CREATININE URINE RATIO
Creatinine,U: 128.1 mg/dL
Microalb Creat Ratio: 8.5 mg/g (ref 0.0–30.0)
Microalb, Ur: 10.9 mg/dL — ABNORMAL HIGH (ref 0.0–1.9)

## 2022-01-28 LAB — FERRITIN: Ferritin: 52.5 ng/mL (ref 22.0–322.0)

## 2022-01-28 LAB — IRON: Iron: 143 ug/dL (ref 42–165)

## 2022-01-28 NOTE — Patient Instructions (Signed)
    GO TO THE LAB : Get the blood work     [place], [place] back for a checkup in 6 months

## 2022-01-28 NOTE — Progress Notes (Unsigned)
Subjective:    Patient ID: Eric Vang, male    DOB: [birth year], 76 y.o.   MRN: [id number]  DOS:  01/28/2022 Type of visit - description: CPX  Here for CPX. Doing well. Previously seen with leg swelling: Resolved. Has no LUTS.  Review of Systems See above   Past Medical History:  Diagnosis Date   Anemia    Basal cell carcinoma of nose 2017   removed. a small area right eye   Bladder cancer ([place])    BPH (benign prostatic hyperplasia)    CLUSTER HEADACHE 01/15/2007   2006, 2019   Cluster headaches    Coronary artery disease    DIVERTICULOSIS, COLON 02/04/2008   Elevated PSA    plan possible prostate Bx w/ urology   Erectile dysfunction    Hyperlipidemia    started statin 2015    Inguinal hernia    right   Insomnia    Scrotal varices    Spermatocele of epididymis, single    Unilateral inguinal hernia 2017    Past Surgical History:  Procedure Laterality Date   bladder removed     CARDIAC CATHETERIZATION     > 20 years ago   COLONOSCOPY      x 2 no polyps   COLONOSCOPY W/ POLYPECTOMY  2016 ish   INGUINAL HERNIA REPAIR Right 12/03/2015   Procedure: LAPAROSCOPIC RIGHT INGUINAL HERNIA REPAIR;  Surgeon: [name], MD;  Location: [facility];  Service: General;  Laterality: Right;   INSERTION OF MESH Right 12/03/2015   Procedure: INSERTION OF MESH;  Surgeon: [name], MD;  Location: [place];  Service: General;  Laterality: Right;   MOHS SURGERY  2017   L nose    PROSTATE SURGERY     radical cystectomy (bladder, prostate) Bilateral 05/20/2021   @ [facility]   RADIOLOGY WITH ANESTHESIA N/A 05/01/2018   Procedure: MRI WITH ANESTHESIA   BRAIN WITH AND WITHOUT;  Surgeon: Radiologist, Medication, MD;  Location: [facility];  Service: Radiology;  Laterality: N/A;    Current Outpatient Medications  Medication Instructions   simvastatin (ZOCOR) 10 mg, Oral, Daily   valACYclovir (VALTREX) 1000 MG tablet TAKE 1 TABLET BY MOUTH TWO (2) TIMES DAILY AS NEEDED FOR COLD SORES. FOR  5 DAYS PER EPISODE       Objective:   Physical Exam BP 124/80   Pulse 74   Temp 97.6 F (36.4 C) (Oral)   Resp 18   Ht '6\' 1"'$  (1.854 m)   Wt 208 lb 4 oz (94.5 kg)   SpO2 97%   BMI 27.48 kg/m  General: Well developed, NAD, BMI noted Neck: No  thyromegaly  HEENT:  Normocephalic . Face symmetric, atraumatic Lungs:  CTA B Normal respiratory effort, no intercostal retractions, no accessory muscle use. Heart: RRR,  no murmur.  Abdomen:  Not distended, soft, non-tender. No rebound or rigidity.  Urinary bag in place. Lower extremities: no pretibial edema bilaterally  Skin: Exposed areas without rash. Not pale. Not jaundice Neurologic:  alert & oriented X3.  Speech normal, gait appropriate for age and unassisted Strength symmetric and appropriate for age.  Psych: Cognition and judgment appear intact.  Cooperative with normal attention span and concentration.  Behavior appropriate. No anxious or depressed appearing.     Assessment     Assessment Hyperlipidemia , rx statins 2015 Insomnia BCC, nose 2017, sees derm q year Cold sores, prn Valtrex H/o cluster HAs 1 -- 2006. Had  sx again 2019, MRI 04/2018; topamax  helped but had s/e (fatigue, mental fog) Urology -Elevated PSA 08-2015, UCX (-), s/p abx, saw urology, DRE benign except for mild enlargement, PSA decreased, dx w/  Sx of bladder outlet obstruction -Bladder cancer Dx 11-2020 [facility]).   -At [facility] on 05/20/2021: -CYSTECTOMY W/URETEROILEAL CONDUIT/SIGMOID BLADDER    -RADICAL PROSTATECTOMY;    - PELVIC LYMPHADENECTOMY, INCLUDING EXTERNAL ILIAC, HYPOGASTRIC, AND OBTURATOR   PLAN: Here for CPX See LOV, left ankle swelling, [place] negative for DVT Hyperlipidemia: On statins, checking labs. Insomnia: Not a major issue at this point Urology history reviewed, essentially asymptomatic, follow-up by urology. RTC 6 months -Td 2016 -pnm 23: 2016; prevnar: 2017; PNM 20:next year - zostavax : at age 68  ; s/p shingrex -  s/p RSV recently  -covid vax recently   UTD -  had a flu shot   --CCS: cscope 2007, cscope 02-2016 , + polyps , next changes to 02-2023 per GI letter  --Prostate cancer screening: sees  urology, status post radical prostatectomy. --Lifestyle : cont to be very good  --Labs: CMP, ionized calcium, microalbumin (low serum protein), FLP, CBC, iron ferritin. -Has a POA

## 2022-01-29 ENCOUNTER — Encounter: Payer: Self-pay | Admitting: Internal Medicine

## 2022-01-29 NOTE — Assessment & Plan Note (Signed)
Here for CPX See LOV, left ankle swelling, [place] negative for DVT Hyperlipidemia: On statins, checking labs. Insomnia: Not a major issue at this point Urology history reviewed, essentially asymptomatic, follow-up by urology. RTC 6 months

## 2022-01-29 NOTE — Assessment & Plan Note (Signed)
-  Td 2016 -pnm 23: 2016; prevnar: 2017; PNM 20:next year - zostavax : at age 76  ; s/p shingrex - s/p RSV recently  -covid vax recently   UTD -  had a flu shot   --CCS: cscope 2007, cscope 02-2016 , + polyps , next   02-2023 per GI letter  --Prostate cancer screening: sees  urology, status post radical prostatectomy. --Lifestyle : cont to be very good  --Labs: CMP, ionized calcium, microalbumin (low serum protein), FLP, CBC, iron ferritin. -Has a POA

## 2022-01-30 LAB — CALCIUM, IONIZED: Calcium, Ion: 5.1 mg/dL (ref 4.7–5.5)

## 2022-02-07 DIAGNOSIS — Z936 Other artificial openings of urinary tract status: Secondary | ICD-10-CM | POA: Diagnosis not present

## 2022-02-07 DIAGNOSIS — Z8551 Personal history of malignant neoplasm of bladder: Secondary | ICD-10-CM | POA: Diagnosis not present

## 2022-03-09 DIAGNOSIS — Z8551 Personal history of malignant neoplasm of bladder: Secondary | ICD-10-CM | POA: Diagnosis not present

## 2022-03-09 DIAGNOSIS — Z936 Other artificial openings of urinary tract status: Secondary | ICD-10-CM | POA: Diagnosis not present

## 2022-04-10 DIAGNOSIS — Z8551 Personal history of malignant neoplasm of bladder: Secondary | ICD-10-CM | POA: Diagnosis not present

## 2022-04-10 DIAGNOSIS — Z936 Other artificial openings of urinary tract status: Secondary | ICD-10-CM | POA: Diagnosis not present

## 2022-04-13 DIAGNOSIS — Z08 Encounter for follow-up examination after completed treatment for malignant neoplasm: Secondary | ICD-10-CM | POA: Diagnosis not present

## 2022-04-13 DIAGNOSIS — Z8551 Personal history of malignant neoplasm of bladder: Secondary | ICD-10-CM | POA: Diagnosis not present

## 2022-04-13 DIAGNOSIS — Z87891 Personal history of nicotine dependence: Secondary | ICD-10-CM | POA: Diagnosis not present

## 2022-04-13 DIAGNOSIS — C679 Malignant neoplasm of bladder, unspecified: Secondary | ICD-10-CM | POA: Diagnosis not present

## 2022-04-13 DIAGNOSIS — Z9079 Acquired absence of other genital organ(s): Secondary | ICD-10-CM | POA: Diagnosis not present

## 2022-04-13 DIAGNOSIS — C678 Malignant neoplasm of overlapping sites of bladder: Secondary | ICD-10-CM | POA: Diagnosis not present

## 2022-05-11 DIAGNOSIS — Z8551 Personal history of malignant neoplasm of bladder: Secondary | ICD-10-CM | POA: Diagnosis not present

## 2022-05-11 DIAGNOSIS — Z936 Other artificial openings of urinary tract status: Secondary | ICD-10-CM | POA: Diagnosis not present

## 2022-05-16 DIAGNOSIS — R69 Illness, unspecified: Secondary | ICD-10-CM | POA: Diagnosis not present

## 2022-05-17 DIAGNOSIS — D225 Melanocytic nevi of trunk: Secondary | ICD-10-CM | POA: Diagnosis not present

## 2022-05-17 DIAGNOSIS — L821 Other seborrheic keratosis: Secondary | ICD-10-CM | POA: Diagnosis not present

## 2022-05-17 DIAGNOSIS — L812 Freckles: Secondary | ICD-10-CM | POA: Diagnosis not present

## 2022-05-17 DIAGNOSIS — L738 Other specified follicular disorders: Secondary | ICD-10-CM | POA: Diagnosis not present

## 2022-05-17 DIAGNOSIS — Z85828 Personal history of other malignant neoplasm of skin: Secondary | ICD-10-CM | POA: Diagnosis not present

## 2022-05-17 DIAGNOSIS — D2371 Other benign neoplasm of skin of right lower limb, including hip: Secondary | ICD-10-CM | POA: Diagnosis not present

## 2022-05-17 DIAGNOSIS — L720 Epidermal cyst: Secondary | ICD-10-CM | POA: Diagnosis not present

## 2022-05-17 DIAGNOSIS — D1801 Hemangioma of skin and subcutaneous tissue: Secondary | ICD-10-CM | POA: Diagnosis not present

## 2022-05-17 DIAGNOSIS — L570 Actinic keratosis: Secondary | ICD-10-CM | POA: Diagnosis not present

## 2022-05-17 DIAGNOSIS — L918 Other hypertrophic disorders of the skin: Secondary | ICD-10-CM | POA: Diagnosis not present

## 2022-06-02 ENCOUNTER — Telehealth: Payer: Self-pay | Admitting: Internal Medicine

## 2022-06-02 NOTE — Telephone Encounter (Signed)
Contacted Eric Vang to schedule their annual wellness visit. Appointment made for 06/22/2022.  Sherol Dade; [facility] Direct Dial: [phone]

## 2022-06-09 DIAGNOSIS — Z936 Other artificial openings of urinary tract status: Secondary | ICD-10-CM | POA: Diagnosis not present

## 2022-06-09 DIAGNOSIS — Z8551 Personal history of malignant neoplasm of bladder: Secondary | ICD-10-CM | POA: Diagnosis not present

## 2022-06-24 ENCOUNTER — Ambulatory Visit (INDEPENDENT_AMBULATORY_CARE_PROVIDER_SITE_OTHER): Payer: Medicare HMO | Admitting: *Deleted

## 2022-06-24 DIAGNOSIS — Z0000 Encounter for general adult medical examination without abnormal findings: Secondary | ICD-10-CM | POA: Diagnosis not present

## 2022-06-24 NOTE — Patient Instructions (Signed)
Eric Vang , Thank you for taking time to come for your Medicare Wellness Visit. I appreciate your ongoing commitment to your health goals. Please review the following plan we discussed and let me know if I can assist you in the future.     This is a list of the screening recommended for you and due dates:  Health Maintenance  Topic Date Due   COVID-19 Vaccine (7 - 2023-24 season) 03/19/2022   Colon Cancer Screening  02/22/2023   Medicare Annual Wellness Visit  06/24/2023   DTaP/Tdap/Td vaccine (3 - Td or Tdap) 11/11/2024   Pneumonia Vaccine  Completed   Flu Shot  Completed   Hepatitis C Screening: USPSTF Recommendation to screen - Ages 18-79 yo.  Completed   Zoster (Shingles) Vaccine  Completed   HPV Vaccine  Aged Out    Next appointment: Follow up in one year for your annual wellness visit.   Preventive Care 75 Years and Older, Male Preventive care refers to lifestyle choices and visits with your health care provider that can promote health and wellness. What does preventive care include? A yearly physical exam. This is also called an annual well check. Dental exams once or twice a year. Routine eye exams. Ask your health care provider how often you should have your eyes checked. Personal lifestyle choices, including: Daily care of your teeth and gums. Regular physical activity. Eating a healthy diet. Avoiding tobacco and drug use. Limiting alcohol use. Practicing safe sex. Taking low doses of aspirin every day. Taking vitamin and mineral supplements as recommended by your health care provider. What happens during an annual well check? The services and screenings done by your health care provider during your annual well check will depend on your age, overall health, lifestyle risk factors, and family history of disease. Counseling  Your health care provider may ask you questions about your: Alcohol use. Tobacco use. Drug use. Emotional well-being. Home and  relationship well-being. Sexual activity. Eating habits. History of falls. Memory and ability to understand (cognition). Work and work Statistician. Screening  You may have the following tests or measurements: Height, weight, and BMI. Blood pressure. Lipid and cholesterol levels. These may be checked every 5 years, or more frequently if you are over 49 years old. Skin check. Lung cancer screening. You may have this screening every year starting at age 18 if you have a 30-pack-year history of smoking and currently smoke or have quit within the past 15 years. Fecal occult blood test (FOBT) of the stool. You may have this test every year starting at age 62. Flexible sigmoidoscopy or colonoscopy. You may have a sigmoidoscopy every 5 years or a colonoscopy every 10 years starting at age 10. Prostate cancer screening. Recommendations will vary depending on your family history and other risks. Hepatitis C blood test. Hepatitis B blood test. Sexually transmitted disease (STD) testing. Diabetes screening. This is done by checking your blood sugar (glucose) after you have not eaten for a while (fasting). You may have this done every 1-3 years. Abdominal aortic aneurysm (AAA) screening. You may need this if you are a current or former smoker. Osteoporosis. You may be screened starting at age 82 if you are at high risk. Talk with your health care provider about your test results, treatment options, and if necessary, the need for more tests. Vaccines  Your health care provider may recommend certain vaccines, such as: Influenza vaccine. This is recommended every year. Tetanus, diphtheria, and acellular pertussis (Tdap, Td) vaccine. You  may need a Td booster every 10 years. Zoster vaccine. You may need this after age 45. Pneumococcal 13-valent conjugate (PCV13) vaccine. One dose is recommended after age 84. Pneumococcal polysaccharide (PPSV23) vaccine. One dose is recommended after age 59. Talk to your  health care provider about which screenings and vaccines you need and how often you need them. This information is not intended to replace advice given to you by your health care provider. Make sure you discuss any questions you have with your health care provider. Document Released: 04/17/2015 Document Revised: 12/09/2015 Document Reviewed: 01/20/2015 Elsevier Interactive Patient Education  2017 [employer] Prevention in the Home Falls can cause injuries. They can happen to people of all ages. There are many things you can do to make your home safe and to help prevent falls. What can I do on the outside of my home? Regularly fix the edges of walkways and driveways and fix any cracks. Remove anything that might make you trip as you walk through a door, such as a raised step or threshold. Trim any bushes or trees on the path to your home. Use bright outdoor lighting. Clear any walking paths of anything that might make someone trip, such as rocks or tools. Regularly check to see if handrails are loose or broken. Make sure that both sides of any steps have handrails. Any raised decks and porches should have guardrails on the edges. Have any leaves, snow, or ice cleared regularly. Use sand or salt on walking paths during winter. Clean up any spills in your garage right away. This includes oil or grease spills. What can I do in the bathroom? Use night lights. Install grab bars by the toilet and in the tub and shower. Do not use towel bars as grab bars. Use non-skid mats or decals in the tub or shower. If you need to sit down in the shower, use a plastic, non-slip stool. Keep the floor dry. Clean up any water that spills on the floor as soon as it happens. Remove soap buildup in the tub or shower regularly. Attach bath mats securely with double-sided non-slip rug tape. Do not have throw rugs and other things on the floor that can make you trip. What can I do in the bedroom? Use night  lights. Make sure that you have a light by your bed that is easy to reach. Do not use any sheets or blankets that are too big for your bed. They should not hang down onto the floor. Have a firm chair that has side arms. You can use this for support while you get dressed. Do not have throw rugs and other things on the floor that can make you trip. What can I do in the kitchen? Clean up any spills right away. Avoid walking on wet floors. Keep items that you use a lot in easy-to-reach places. If you need to reach something above you, use a strong step stool that has a grab bar. Keep electrical cords out of the way. Do not use floor polish or wax that makes floors slippery. If you must use wax, use non-skid floor wax. Do not have throw rugs and other things on the floor that can make you trip. What can I do with my stairs? Do not leave any items on the stairs. Make sure that there are handrails on both sides of the stairs and use them. Fix handrails that are broken or loose. Make sure that handrails are as long as the  stairways. Check any carpeting to make sure that it is firmly attached to the stairs. Fix any carpet that is loose or worn. Avoid having throw rugs at the top or bottom of the stairs. If you do have throw rugs, attach them to the floor with carpet tape. Make sure that you have a light switch at the top of the stairs and the bottom of the stairs. If you do not have them, ask someone to add them for you. What else can I do to help prevent falls? Wear shoes that: Do not have high heels. Have rubber bottoms. Are comfortable and fit you well. Are closed at the toe. Do not wear sandals. If you use a stepladder: Make sure that it is fully opened. Do not climb a closed stepladder. Make sure that both sides of the stepladder are locked into place. Ask someone to hold it for you, if possible. Clearly mark and make sure that you can see: Any grab bars or handrails. First and last  steps. Where the edge of each step is. Use tools that help you move around (mobility aids) if they are needed. These include: Canes. Walkers. Scooters. Crutches. Turn on the lights when you go into a dark area. Replace any light bulbs as soon as they burn out. Set up your furniture so you have a clear path. Avoid moving your furniture around. If any of your floors are uneven, fix them. If there are any pets around you, be aware of where they are. Review your medicines with your doctor. Some medicines can make you feel dizzy. This can increase your chance of falling. Ask your doctor what other things that you can do to help prevent falls. This information is not intended to replace advice given to you by your health care provider. Make sure you discuss any questions you have with your health care provider. Document Released: 01/15/2009 Document Revised: 08/27/2015 Document Reviewed: 04/25/2014 Elsevier Interactive Patient Education  2017 [employer].

## 2022-06-24 NOTE — Progress Notes (Signed)
Subjective:   Eric Vang is a 77 y.o. male who presents for Medicare Annual/Subsequent preventive examination.  I connected with  Eric Vang on 06/24/22 by a audio enabled telemedicine application and verified that I am speaking with the correct person using two identifiers.  Patient Location: Home  Provider Location: Office/Clinic  I discussed the limitations of evaluation and management by telemedicine. The patient expressed understanding and agreed to proceed.   Review of Systems     Cardiac Risk Factors include: advanced age (>89men, >3 women);male gender;dyslipidemia     Objective:    There were no vitals filed for this visit. There is no height or weight on file to calculate BMI.     06/24/2022    2:21 PM 06/08/2021    3:09 PM 04/04/2021    1:00 AM 04/02/2021    5:38 PM 03/30/2021    9:22 PM 05/01/2018    7:41 AM 02/22/2016    7:22 AM  Advanced Directives  Does Patient Have a Medical Advance Directive? Yes Yes Yes No No No No  Type of Paramedic of [place];Living will;Out of facility DNR (pink MOST or yellow form) [place];Living will;Out of facility DNR (pink MOST or yellow form) Healthcare Power of Attorney      Does patient want to make changes to medical advance directive? No - Patient declined  No - Patient declined      Copy of [place] in Chart? No - copy requested No - copy requested No - copy requested      Would patient like information on creating a medical advance directive?   No - Patient declined   No - Patient declined     Current Medications (verified) Outpatient Encounter Medications as of 06/24/2022  Medication Sig   simvastatin (ZOCOR) 10 MG tablet Take 1 tablet by mouth once daily   valACYclovir (VALTREX) 1000 MG tablet TAKE 1 TABLET BY MOUTH TWO (2) TIMES DAILY AS NEEDED FOR COLD SORES. FOR 5 DAYS PER EPISODE   No facility-administered encounter medications on  file as of 06/24/2022.    Allergies (verified) Patient has no known allergies.   History: Past Medical History:  Diagnosis Date   Anemia    Basal cell carcinoma of nose 2017   removed. a small area right eye   Bladder cancer ([place])    BPH (benign prostatic hyperplasia)    CLUSTER HEADACHE 01/15/2007   2006, 2019   Cluster headaches    Coronary artery disease    DIVERTICULOSIS, COLON 02/04/2008   Elevated PSA    plan possible prostate Bx w/ urology   Erectile dysfunction    Hyperlipidemia    started statin 2015    Inguinal hernia    right   Insomnia    Scrotal varices    Spermatocele of epididymis, single    Unilateral inguinal hernia 2017   Past Surgical History:  Procedure Laterality Date   bladder removed     CARDIAC CATHETERIZATION     > 20 years ago   COLONOSCOPY      x 2 no polyps   COLONOSCOPY W/ POLYPECTOMY  2016 ish   INGUINAL HERNIA REPAIR Right 12/03/2015   Procedure: LAPAROSCOPIC RIGHT INGUINAL HERNIA REPAIR;  Surgeon: [name], MD;  Location: [place];  Service: General;  Laterality: Right;   INSERTION OF MESH Right 12/03/2015   Procedure: INSERTION OF MESH;  Surgeon: [name], MD;  Location: [place];  Service: General;  Laterality: Right;   MOHS SURGERY  2017   L nose    PROSTATE SURGERY     radical cystectomy (bladder, prostate) Bilateral 05/20/2021   @ [facility]   RADIOLOGY WITH ANESTHESIA N/A 05/01/2018   Procedure: MRI WITH ANESTHESIA   BRAIN WITH AND WITHOUT;  Surgeon: Radiologist, Medication, MD;  Location: [place];  Service: Radiology;  Laterality: N/A;   Family History  Problem Relation Age of Onset   Diabetes Mother 77       natural causes.    Atrial fibrillation Mother    Uterine cancer Mother    Cancer Father 77       bone    Cancer Paternal Grandmother        sinus cancer   Colon cancer Neg Hx    Prostate cancer Neg Hx    CAD Neg Hx    Social History   Socioeconomic History   Marital status: Married    Spouse name: Not on file    Number of children: 1   Years of education: Not on file   Highest education level: Not on file  Occupational History   Occupation: fully retired Scientist, water quality  Tobacco Use   Smoking status: Former    Years: 7    Types: Cigarettes   Smokeless tobacco: Never   Tobacco comments:    stopped in 1979, was a light smoker  Vaping Use   Vaping Use: Never used  Substance and Sexual Activity   Alcohol use: Not Currently    Alcohol/week: 0.0 standard drinks of alcohol   Drug use: No   Sexual activity: Not on file  Other Topics Concern   Not on file  Social History Narrative   Moved from [place] .   Lives home with wife, terri. 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  Caffeine 4 cups daily.     Step daughter is a Therapist, sports   Sister in law [name]    Exercise- walks qd    Social Determinants of Health   Financial Resource Strain: Low Risk  (06/08/2021)   Overall Financial Resource Strain (CARDIA)    Difficulty of Paying Living Expenses: Not hard at all  Food Insecurity: No Food Insecurity (06/24/2022)   Hunger Vital Sign    Worried About Running Out of Food in the Last Year: Never true    Ran Out of Food in the Last Year: Never true  Transportation Needs: No Transportation Needs (06/24/2022)   PRAPARE - Hydrologist (Medical): No    Lack of Transportation (Non-Medical): No  Physical Activity: Sufficiently Active (06/24/2022)   Exercise Vital Sign    Days of Exercise per Week: 6 days    Minutes of Exercise per Session: 150+ min  Stress: No Stress Concern Present (06/08/2021)   [place]    Feeling of Stress : Not at all  Social Connections: Moderately Integrated (06/08/2021)   Social Connection and Isolation Panel [NHANES]    Frequency of Communication with Friends and Family: More than three times a week    Frequency of Social Gatherings with  Friends and Family: Once a week    Attends Religious Services: More than 4 times per year    Active Member of [employer] or Organizations: No    Attends Archivist Meetings: Never    Marital Status: Married    Tobacco Counseling Counseling given:  Not Answered Tobacco comments: stopped in 1979, was a light smoker   Clinical Intake:  Pre-visit preparation completed: Yes  Pain : No/denies pain  Diabetes: No  How often do you need to have someone help you when you read instructions, pamphlets, or other written materials from your doctor or pharmacy?: 1 - Never  Activities of Daily Living    06/24/2022    2:26 PM  In your present state of health, do you have any difficulty performing the following activities:  Hearing? 0  Vision? 0  Difficulty concentrating or making decisions? 0  Walking or climbing stairs? 0  Dressing or bathing? 0  Doing errands, shopping? 0  Preparing Food and eating ? N  Using the Toilet? N  In the past six months, have you accidently leaked urine? N  Comment has urostomy bag  Do you have problems with loss of bowel control? N  Managing your Medications? N  Managing your Finances? N  Housekeeping or managing your Housekeeping? N    Patient Care Team: Colon Branch, MD as PCP - General (Internal Medicine) [name], MD as Consulting Physician (General Surgery) Penni Bombard, MD as Consulting Physician (Neurology) Remi Haggard, MD as Consulting Physician (Urology)  Indicate any recent Medical Services you may have received from other than [facility] providers in the past year (date may be approximate).     Assessment:   This is a routine wellness examination for D[place].  Hearing/Vision screen No results found.  Dietary issues and exercise activities discussed: Current Exercise Habits: Home exercise routine, Type of exercise: calisthenics;strength training/weights;walking, Time (Minutes): > 60 (approx 2 hours of walking), Frequency  (Times/Week): 6, Weekly Exercise (Minutes/Week): 0, Intensity: Moderate, Exercise limited by: None identified   Goals Addressed   None    Depression Screen    06/24/2022    2:24 PM 01/28/2022   10:42 AM 12/23/2021    8:04 AM 10/27/2021    3:11 PM 09/03/2021    1:05 PM 06/08/2021    3:06 PM 03/04/2021    2:20 PM  PHQ 2/9 Scores  PHQ - 2 Score 0 0 0 0 0 0 0    Fall Risk    06/24/2022    2:22 PM 01/28/2022   10:42 AM 12/23/2021    8:04 AM 10/27/2021    3:11 PM 09/03/2021    1:05 PM  [place] in the past year? 0 0 0 0 0  Number falls in past yr: 0 0 0 0 0  Injury with Fall? 0 0 0 0 0  Risk for fall due to : No Fall Risks      Follow up Falls evaluation completed Falls evaluation completed Falls evaluation completed Falls evaluation completed Falls evaluation completed    [place]:  Any stairs in or around the home? Yes  If so, are there any without handrails? No  Home free of loose throw rugs in walkways, pet beds, electrical cords, etc? Yes  Adequate lighting in your home to reduce risk of falls? Yes   ASSISTIVE DEVICES UTILIZED TO PREVENT FALLS:  Life alert? No  Use of a cane, walker or w/c? No  Grab bars in the bathroom? No  Shower chair or bench in shower? No  Elevated toilet seat or a handicapped toilet? No   TIMED UP AND GO:  Was the test performed?  No, audio visit .    Cognitive Function:  06/24/2022    2:39 PM 06/08/2021    3:15 PM  6CIT Screen  What Year? 0 points 0 points  What month? 0 points 0 points  What time? 0 points 0 points  Count back from 20 0 points 0 points  Months in reverse 0 points 0 points  Repeat phrase 0 points 0 points  Total Score 0 points 0 points    Immunizations Immunization History  Administered Date(s) Administered   Influenza Split 01/02/2021, 01/06/2022   Influenza Whole 02/04/2008, 01/02/2009   Influenza, High Dose Seasonal PF 02/19/2015, 12/29/2018    Influenza-Unspecified 01/17/2016, 01/08/2017, 01/22/2018   Moderna Covid-19 Vaccine Bivalent Booster 45yrs & up 02/02/2021, 01/22/2022   Moderna Sars-Covid-2 Vaccination 05/27/2019, 06/25/2019, 01/24/2020, 07/09/2020   Pneumococcal Conjugate-13 08/12/2015   Pneumococcal Polysaccharide-23 08/09/2014   Respiratory Syncytial Virus Vaccine,Recomb Aduvanted(Arexvy) 01/06/2022   Td 04/04/2002   Tdap 11/12/2014   Zoster Recombinat (Shingrix) 08/02/2016, 10/06/2016   Zoster, Live 04/24/2007    TDAP status: Up to date  Flu Vaccine status: Up to date  Pneumococcal vaccine status: Up to date  Covid-19 vaccine status: Information provided on how to obtain vaccines.   Qualifies for Shingles Vaccine? Yes   Zostavax completed Yes   Shingrix Completed?: Yes  Screening Tests Health Maintenance  Topic Date Due   COVID-19 Vaccine (7 - 2023-24 season) 03/19/2022   Medicare Annual Wellness (AWV)  06/09/2022   COLONOSCOPY (Pts 45-60yrs Insurance coverage will need to be confirmed)  02/22/2023   DTaP/Tdap/Td (3 - Td or Tdap) 11/11/2024   Pneumonia Vaccine 85+ Years old  Completed   INFLUENZA VACCINE  Completed   Hepatitis C Screening  Completed   Zoster Vaccines- Shingrix  Completed   HPV VACCINES  Aged Out    Health Maintenance  Health Maintenance Due  Topic Date Due   COVID-19 Vaccine (7 - 2023-24 season) 03/19/2022   Medicare Annual Wellness (AWV)  06/09/2022    Colorectal cancer screening: Type of screening: Colonoscopy. Completed 02/22/16. Repeat every 7 years  Lung Cancer Screening: (Low Dose CT Chest recommended if Age 75-80 years, 30 pack-year currently smoking OR have quit w/in 15years.) does not qualify.   Additional Screening:  Hepatitis C Screening: does qualify; Completed 08/17/16  Vision Screening: Recommended annual ophthalmology exams for early detection of glaucoma and other disorders of the eye. Is the patient up to date with their annual eye exam?  Yes  Who is the  provider or what is the name of the office in which the patient attends annual eye exams? [facility] Dr. Rolley Sims If pt is not established with a provider, would they like to be referred to a provider to establish care? No .   Dental Screening: Recommended annual dental exams for proper oral hygiene  Community Resource Referral / Chronic Care Management: CRR required this visit?  No   CCM required this visit?  No      Plan:     I have personally reviewed and noted the following in the patient's chart:   Medical and social history Use of alcohol, tobacco or illicit drugs  Current medications and supplements including opioid prescriptions. Patient is not currently taking opioid prescriptions. Functional ability and status Nutritional status Physical activity Advanced directives List of other physicians Hospitalizations, surgeries, and ER visits in previous 12 months Vitals Screenings to include cognitive, depression, and falls Referrals and appointments  In addition, I have reviewed and discussed with patient certain preventive protocols, quality metrics, and best practice recommendations.  A written personalized care plan for preventive services as well as general preventive health recommendations were provided to patient.   Due to this being a telephonic visit, the after visit summary with patients personalized plan was offered to patient via mail or my-chart. Patient would like to access on my-chart.  Beatris Ship, [place]   06/24/2022   Nurse Notes: None

## 2022-06-26 ENCOUNTER — Other Ambulatory Visit: Payer: Self-pay | Admitting: Internal Medicine

## 2022-07-05 DIAGNOSIS — Z936 Other artificial openings of urinary tract status: Secondary | ICD-10-CM | POA: Diagnosis not present

## 2022-07-05 DIAGNOSIS — Z8551 Personal history of malignant neoplasm of bladder: Secondary | ICD-10-CM | POA: Diagnosis not present

## 2022-08-02 ENCOUNTER — Encounter: Payer: Self-pay | Admitting: Internal Medicine

## 2022-08-02 ENCOUNTER — Ambulatory Visit (INDEPENDENT_AMBULATORY_CARE_PROVIDER_SITE_OTHER): Payer: Medicare HMO | Admitting: Internal Medicine

## 2022-08-02 VITALS — BP 124/72 | HR 73 | Temp 97.7°F | Resp 16 | Ht 73.0 in | Wt 210.4 lb

## 2022-08-02 DIAGNOSIS — Z8551 Personal history of malignant neoplasm of bladder: Secondary | ICD-10-CM

## 2022-08-02 DIAGNOSIS — E785 Hyperlipidemia, unspecified: Secondary | ICD-10-CM

## 2022-08-02 NOTE — Patient Instructions (Addendum)
Please bring [place] a copy of your Healthcare Power of Attorney for your chart.  Recommend to contact the gastroenterology office and schedule a colonoscopy, you are due in November. [phone]  Arrange for a physical exam in 6 to 7 months from now

## 2022-08-02 NOTE — Progress Notes (Unsigned)
Subjective:    Patient ID: Eric Vang, male    DOB: 11/19/1945, 77 y.o.   MRN: [id number]  DOS:  08/02/2022 Type of visit - description: Follow-up  Since the last office visit is doing well. Was seen at [facility] due to history of bladder cancer, everything was fine, was recommended to come back in 1 year. Notes and labs reviewed. He denies fever or chills Urine looks clear with no gross hematuria.   Review of Systems See above   Past Medical History:  Diagnosis Date   Anemia    Basal cell carcinoma of nose 2017   removed. a small area right eye   Bladder cancer ([facility])    BPH (benign prostatic hyperplasia)    CLUSTER HEADACHE 01/15/2007   2006, 2019   Cluster headaches    Coronary artery disease    DIVERTICULOSIS, COLON 02/04/2008   Elevated PSA    plan possible prostate Bx w/ urology   Erectile dysfunction    Hyperlipidemia    started statin 2015    Inguinal hernia    right   Insomnia    Scrotal varices    Spermatocele of epididymis, single    Unilateral inguinal hernia 2017    Past Surgical History:  Procedure Laterality Date   bladder removed     CARDIAC CATHETERIZATION     > 20 years ago   COLONOSCOPY      x 2 no polyps   COLONOSCOPY W/ POLYPECTOMY  2016 ish   INGUINAL HERNIA REPAIR Right 12/03/2015   Procedure: LAPAROSCOPIC RIGHT INGUINAL HERNIA REPAIR;  Surgeon: Gaynelle Adu, MD;  Location: [facility] OR;  Service: General;  Laterality: Right;   INSERTION OF MESH Right 12/03/2015   Procedure: INSERTION OF MESH;  Surgeon: Gaynelle Adu, MD;  Location: [facility];  Service: General;  Laterality: Right;   MOHS SURGERY  2017   L nose    PROSTATE SURGERY     radical cystectomy (bladder, prostate) Bilateral 05/20/2021   @ [facility]   RADIOLOGY WITH ANESTHESIA N/A 05/01/2018   Procedure: MRI WITH ANESTHESIA   BRAIN WITH AND WITHOUT;  Surgeon: Radiologist, Medication, MD;  Location: [facility] OR;  Service: Radiology;  Laterality: N/A;    Current Outpatient Medications   Medication Instructions   simvastatin (ZOCOR) 10 mg, Oral, Daily   valACYclovir (VALTREX) 1000 MG tablet TAKE 1 TABLET BY MOUTH TWO (2) TIMES DAILY AS NEEDED FOR COLD SORES. FOR 5 DAYS PER EPISODE       Objective:   Physical Exam BP 124/72   Pulse 73   Temp 97.7 F (36.5 C) (Oral)   Resp 16   Ht 6\' 1"  (1.854 m)   Wt 210 lb 6 oz (95.4 kg)   SpO2 97%   BMI 27.76 kg/m  General:   Well developed, NAD, BMI noted. HEENT:  Normocephalic . Face symmetric, atraumatic Lungs:  CTA B Normal respiratory effort, no intercostal retractions, no accessory muscle use. Heart: RRR,  no murmur.  Lower extremities: no pretibial edema bilaterally  Skin: Not pale. Not jaundice Neurologic:  alert & oriented X3.  Speech normal, gait appropriate for age and unassisted Psych--  Cognition and judgment appear intact.  Cooperative with normal attention span and concentration.  Behavior appropriate. No anxious or depressed appearing.      Assessment     Assessment Hyperlipidemia , rx statins 2015 Insomnia BCC, nose 2017, sees derm q year Cold sores, prn Valtrex H/o cluster HAs 1 -- 2006. Had  sx  again 2019, MRI 04/2018; topamax helped but had s/e (fatigue, mental fog) Urology -Elevated PSA 08-2015,  saw urology, DRE benign except for mild enlargement, PSA decreased, dx w/  Sx of bladder outlet obstruction -Bladder cancer Dx 11-2020 [facility]).   -At [facility] on 05/20/2021: -CYSTECTOMY W/URETEROILEAL CONDUIT/SIGMOID BLADDER    -RADICAL PROSTATECTOMY;  w/  PELVIC LYMPHADENECTOMY, INCLUDING EXTERNAL ILIAC, HYPOGASTRIC, AND OBTURATOR   PLAN: Hyperlipidemia: Well-controlled on simvastatin BCC: Saw dermatology recently History of cystectomy & radical prostatectomy: Was seen at [facility] 04-2022, CT abdomen and pelvis with no evidence of cancer recurrence, CBC and CMP within normal.  Was told to come back in 1 year. Preventive care: Due for a colonoscopy 02/2023, patient would like to get that  scheduled.  Recommend to call GI. RTC 6 to 7 months CPX    10 Here for CPX See LOV, left ankle swelling, [place] negative for DVT Hyperlipidemia: On statins, checking labs. Insomnia: Not a major issue at this point Urology history reviewed, essentially asymptomatic, follow-up by urology. RTC 6 months

## 2022-08-04 DIAGNOSIS — Z8551 Personal history of malignant neoplasm of bladder: Secondary | ICD-10-CM | POA: Diagnosis not present

## 2022-08-04 DIAGNOSIS — Z936 Other artificial openings of urinary tract status: Secondary | ICD-10-CM | POA: Diagnosis not present

## 2022-08-04 NOTE — Assessment & Plan Note (Signed)
Hyperlipidemia: Well-controlled on simvastatin BCC: Saw dermatology recently History of cystectomy & radical prostatectomy: Was seen at [facility] 04-2022, CT abdomen and pelvis with no evidence of cancer recurrence, CBC and CMP within normal.  Was told to come back in 1 year. Preventive care: Due for a colonoscopy 02/2023, patient would like to get that scheduled.  Recommend to call GI. RTC 6 to 7 months CPX

## 2022-08-28 DIAGNOSIS — R69 Illness, unspecified: Secondary | ICD-10-CM | POA: Diagnosis not present

## 2022-09-05 DIAGNOSIS — Z8551 Personal history of malignant neoplasm of bladder: Secondary | ICD-10-CM | POA: Diagnosis not present

## 2022-09-05 DIAGNOSIS — Z936 Other artificial openings of urinary tract status: Secondary | ICD-10-CM | POA: Diagnosis not present

## 2022-09-14 DIAGNOSIS — H02889 Meibomian gland dysfunction of unspecified eye, unspecified eyelid: Secondary | ICD-10-CM | POA: Diagnosis not present

## 2022-09-14 DIAGNOSIS — H5203 Hypermetropia, bilateral: Secondary | ICD-10-CM | POA: Diagnosis not present

## 2022-09-14 DIAGNOSIS — H524 Presbyopia: Secondary | ICD-10-CM | POA: Diagnosis not present

## 2022-09-14 DIAGNOSIS — H52223 Regular astigmatism, bilateral: Secondary | ICD-10-CM | POA: Diagnosis not present

## 2022-09-14 DIAGNOSIS — H25813 Combined forms of age-related cataract, bilateral: Secondary | ICD-10-CM | POA: Diagnosis not present

## 2022-09-29 ENCOUNTER — Other Ambulatory Visit: Payer: Self-pay | Admitting: Internal Medicine

## 2022-10-05 DIAGNOSIS — Z936 Other artificial openings of urinary tract status: Secondary | ICD-10-CM | POA: Diagnosis not present

## 2022-10-05 DIAGNOSIS — Z8551 Personal history of malignant neoplasm of bladder: Secondary | ICD-10-CM | POA: Diagnosis not present

## 2022-11-06 DIAGNOSIS — Z936 Other artificial openings of urinary tract status: Secondary | ICD-10-CM | POA: Diagnosis not present

## 2022-11-06 DIAGNOSIS — Z8551 Personal history of malignant neoplasm of bladder: Secondary | ICD-10-CM | POA: Diagnosis not present

## 2022-11-14 ENCOUNTER — Encounter: Payer: Self-pay | Admitting: Gastroenterology

## 2022-12-07 DIAGNOSIS — Z8551 Personal history of malignant neoplasm of bladder: Secondary | ICD-10-CM | POA: Diagnosis not present

## 2022-12-07 DIAGNOSIS — Z936 Other artificial openings of urinary tract status: Secondary | ICD-10-CM | POA: Diagnosis not present

## 2022-12-15 ENCOUNTER — Other Ambulatory Visit: Payer: Self-pay | Admitting: Internal Medicine

## 2023-01-06 DIAGNOSIS — Z8551 Personal history of malignant neoplasm of bladder: Secondary | ICD-10-CM | POA: Diagnosis not present

## 2023-01-06 DIAGNOSIS — Z936 Other artificial openings of urinary tract status: Secondary | ICD-10-CM | POA: Diagnosis not present

## 2023-01-16 ENCOUNTER — Ambulatory Visit (AMBULATORY_SURGERY_CENTER): Payer: Medicare HMO | Admitting: *Deleted

## 2023-01-16 VITALS — Ht 73.0 in | Wt 206.0 lb

## 2023-01-16 DIAGNOSIS — Z860100 Personal history of colon polyps, unspecified: Secondary | ICD-10-CM

## 2023-01-16 MED ORDER — NA SULFATE-K SULFATE-MG SULF 17.5-3.13-1.6 GM/177ML PO SOLN: 1.0000 | Freq: Once | ORAL | 0 refills | Status: AC

## 2023-01-16 NOTE — Progress Notes (Signed)
Pre visit completed over telephone.  Instructions mailed and forwarded through MyChart.   No egg or soy allergy known to patient  No issues known to pt with past sedation with any surgeries or procedures Patient denies ever being told they had issues or difficulty with intubation  No FH of Malignant Hyperthermia Pt is not on diet pills Pt is not on  home 02  Pt is not on blood thinners  Pt denies issues with constipation  No A fib or A flutter Have any cardiac testing pending-  NO Pt instructed to use Singlecare.com or GoodRx for a price reduction on prep

## 2023-02-03 ENCOUNTER — Encounter: Payer: Self-pay | Admitting: Gastroenterology

## 2023-02-06 DIAGNOSIS — Z936 Other artificial openings of urinary tract status: Secondary | ICD-10-CM | POA: Diagnosis not present

## 2023-02-06 DIAGNOSIS — Z8551 Personal history of malignant neoplasm of bladder: Secondary | ICD-10-CM | POA: Diagnosis not present

## 2023-02-11 ENCOUNTER — Encounter: Payer: Self-pay | Admitting: Certified Registered Nurse Anesthetist

## 2023-02-14 ENCOUNTER — Encounter: Payer: Self-pay | Admitting: Gastroenterology

## 2023-02-14 ENCOUNTER — Ambulatory Visit: Payer: Medicare HMO | Admitting: Gastroenterology

## 2023-02-14 VITALS — BP 119/76 | HR 62 | Temp 97.3°F | Resp 12 | Ht 73.0 in | Wt 206.0 lb

## 2023-02-14 DIAGNOSIS — I2510 Atherosclerotic heart disease of native coronary artery without angina pectoris: Secondary | ICD-10-CM | POA: Diagnosis not present

## 2023-02-14 DIAGNOSIS — D123 Benign neoplasm of transverse colon: Secondary | ICD-10-CM | POA: Diagnosis not present

## 2023-02-14 DIAGNOSIS — Z860100 Personal history of colon polyps, unspecified: Secondary | ICD-10-CM | POA: Diagnosis not present

## 2023-02-14 DIAGNOSIS — D122 Benign neoplasm of ascending colon: Secondary | ICD-10-CM | POA: Diagnosis not present

## 2023-02-14 DIAGNOSIS — Z09 Encounter for follow-up examination after completed treatment for conditions other than malignant neoplasm: Secondary | ICD-10-CM

## 2023-02-14 DIAGNOSIS — D120 Benign neoplasm of cecum: Secondary | ICD-10-CM

## 2023-02-14 MED ORDER — SODIUM CHLORIDE 0.9 % IV SOLN: 500.0000 mL | Freq: Once | INTRAVENOUS | Status: DC

## 2023-02-14 NOTE — Progress Notes (Unsigned)
Report given to PACU, vss 

## 2023-02-14 NOTE — Progress Notes (Unsigned)
Pt's states no medical or surgical changes since previsit or office visit. 

## 2023-02-14 NOTE — Progress Notes (Signed)
Called to room to assist during endoscopic procedure.  Patient ID and intended procedure confirmed with present staff. Received instructions for my participation in the procedure from the performing physician.  

## 2023-02-14 NOTE — Patient Instructions (Signed)
Please read handouts provided. Continue present medications. Await pathology results. Return to GI office as needed.   YOU HAD AN ENDOSCOPIC PROCEDURE TODAY AT [facility]:   Refer to the procedure report that was given to you for any specific questions about what was found during the examination.  If the procedure report does not answer your questions, please call your gastroenterologist to clarify.  If you requested that your care partner not be given the details of your procedure findings, then the procedure report has been included in a sealed envelope for you to review at your convenience later.  YOU SHOULD EXPECT: Some feelings of bloating in the abdomen. Passage of more gas than usual.  Walking can help get rid of the air that was put into your GI tract during the procedure and reduce the bloating. If you had a lower endoscopy (such as a colonoscopy or flexible sigmoidoscopy) you may notice spotting of blood in your stool or on the toilet paper. If you underwent a bowel prep for your procedure, you may not have a normal bowel movement for a few days.  Please Note:  You might notice some irritation and congestion in your nose or some drainage.  This is from the oxygen used during your procedure.  There is no need for concern and it should clear up in a day or so.  SYMPTOMS TO REPORT IMMEDIATELY:  Following lower endoscopy (colonoscopy or flexible sigmoidoscopy):  Excessive amounts of blood in the stool  Significant tenderness or worsening of abdominal pains  Swelling of the abdomen that is new, acute  Fever of 100F or higher   For urgent or emergent issues, a gastroenterologist can be reached at any hour by calling [phone]. Do not use MyChart messaging for urgent concerns.    DIET:  We do recommend a small meal at first, but then you may proceed to your regular diet.  Drink plenty of fluids but you should avoid alcoholic beverages for 24 hours.  ACTIVITY:   You should plan to take it easy for the rest of today and you should NOT DRIVE or use heavy machinery until tomorrow (because of the sedation medicines used during the test).    FOLLOW UP: Our staff will call the number listed on your records the next business day following your procedure.  We will call around 7:15- 8:00 am to check on you and address any questions or concerns that you may have regarding the information given to you following your procedure. If we do not reach you, we will leave a message.     If any biopsies were taken you will be contacted by phone or by letter within the next 1-3 weeks.  Please call us at [phone] if you have not heard about the biopsies in 3 weeks.    SIGNATURES/CONFIDENTIALITY: You and/or your care partner have signed paperwork which will be entered into your electronic medical record.  These signatures attest to the fact that that the information above on your After Visit Summary has been reviewed and is understood.  Full responsibility of the confidentiality of this discharge information lies with you and/or your care-partner.

## 2023-02-14 NOTE — Progress Notes (Unsigned)
[name] History and Physical   Primary Care Physician:  Eric Plump, MD   Reason for Procedure:  History of adenomatous colon polyps  Plan:    Surveillance colonoscopy with possible interventions as needed     HPI: Eric Vang is a very pleasant 77 y.o. male here for surveillance colonoscopy. Denies any nausea, vomiting, abdominal pain, melena or bright red blood per rectum  The risks and benefits as well as alternatives of endoscopic procedure(s) have been discussed and reviewed. All questions answered. The patient agrees to proceed.    Past Medical History:  Diagnosis Date   Anemia    Basal cell carcinoma of nose 2017   removed. a small area right eye   Bladder cancer (HCC)    BPH (benign prostatic hyperplasia)    CLUSTER HEADACHE 01/15/2007   2006, 2019   Cluster headaches    Coronary artery disease    DIVERTICULOSIS, COLON 02/04/2008   Elevated PSA    plan possible prostate Bx w/ urology   Erectile dysfunction    Hyperlipidemia    started statin 2015    Inguinal hernia    right   Insomnia    Scrotal varices    Spermatocele of epididymis, single    Unilateral inguinal hernia 2017    Past Surgical History:  Procedure Laterality Date   bladder removed     CARDIAC CATHETERIZATION     > 20 years ago   COLONOSCOPY      x 2 no polyps   COLONOSCOPY W/ POLYPECTOMY  2016 ish   INGUINAL HERNIA REPAIR Right 12/03/2015   Procedure: LAPAROSCOPIC RIGHT INGUINAL HERNIA REPAIR;  Surgeon: Eric Adu, MD;  Location: [facility] OR;  Service: General;  Laterality: Right;   INSERTION OF MESH Right 12/03/2015   Procedure: INSERTION OF MESH;  Surgeon: Eric Adu, MD;  Location: [facility] OR;  Service: General;  Laterality: Right;   MOHS SURGERY  2017   L nose    PROSTATE SURGERY     radical cystectomy (bladder, prostate) Bilateral 05/20/2021   @ [facility]   RADIOLOGY WITH ANESTHESIA N/A 05/01/2018   Procedure: MRI WITH ANESTHESIA   BRAIN WITH AND WITHOUT;  Surgeon:  Radiologist, Medication, MD;  Location: [facility] OR;  Service: Radiology;  Laterality: N/A;    Prior to Admission medications   Medication Sig Start Date End Date Taking? Authorizing Provider  simvastatin (ZOCOR) 10 MG tablet Take 1 tablet (10 mg total) by mouth daily. 12/15/22  Yes Paz, Nolon Rod, MD  valACYclovir (VALTREX) 1000 MG tablet Take 1 tablet (1,000 mg total) by mouth 2 (two) times daily as needed (for cold sores per 5 days). 09/29/22   Eric Plump, MD    Current Outpatient Medications  Medication Sig Dispense Refill   simvastatin (ZOCOR) 10 MG tablet Take 1 tablet (10 mg total) by mouth daily. 90 tablet 1   valACYclovir (VALTREX) 1000 MG tablet Take 1 tablet (1,000 mg total) by mouth 2 (two) times daily as needed (for cold sores per 5 days). 60 tablet 3   Current Facility-Administered Medications  Medication Dose Route Frequency Provider Last Rate Last Admin   0.9 %  sodium chloride infusion  500 mL Intravenous Once Eric Form, MD        Allergies as of 02/14/2023   (No Known Allergies)    Family History  Problem Relation Age of Onset   Diabetes Mother 88       natural causes.    Atrial  fibrillation Mother    Uterine cancer Mother    Cancer Father 76       bone    Cancer Paternal Grandmother        sinus cancer   Colon cancer Neg Hx    Prostate cancer Neg Hx    CAD Neg Hx    Cancer - Colon Neg Hx     Social History   Socioeconomic History   Marital status: Married    Spouse name: Not on file   Number of children: 1   Years of education: Not on file   Highest education level: Bachelor's degree (e.g., BA, AB, BS)  Occupational History   Occupation: fully retired Market researcher  Tobacco Use   Smoking status: Former    Types: Cigarettes   Smokeless tobacco: Never   Tobacco comments:    stopped in 1979, was a light smoker  Vaping Use   Vaping status: Never Used  Substance and Sexual Activity   Alcohol use: Yes    Alcohol/week: 1.0 standard drink of alcohol     Types: 1 Cans of beer per week    Comment: occassionally   Drug use: No   Sexual activity: Not on file  Other Topics Concern   Not on file  Social History Narrative   Moved from [place] .   Lives home with wife, terri. 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  Caffeine 4 cups daily.     Step daughter is a Charity fundraiser   Sister in law [employer]    Exercise- walks qd    Social Determinants of Health   Financial Resource Strain: Low Risk  (07/28/2022)   Overall Financial Resource Strain (CARDIA)    Difficulty of Paying Living Expenses: Not very hard  Food Insecurity: No Food Insecurity (07/28/2022)   Hunger Vital Sign    Worried About Running Out of Food in the Last Year: Never true    Ran Out of Food in the Last Year: Never true  Transportation Needs: No Transportation Needs (07/28/2022)   PRAPARE - Administrator, Civil Service (Medical): No    Lack of Transportation (Non-Medical): No  Physical Activity: Sufficiently Active (07/28/2022)   Exercise Vital Sign    Days of Exercise per Week: 6 days    Minutes of Exercise per Session: 60 min  Stress: Stress Concern Present (07/28/2022)   [employer] of Occupational Health - Occupational Stress Questionnaire    Feeling of Stress : To some extent  Social Connections: Unknown (07/28/2022)   Social Connection and Isolation Panel [NHANES]    Frequency of Communication with Friends and Family: More than three times a week    Frequency of Social Gatherings with Friends and Family: Not on file    Attends Religious Services: Not on file    Active Member of Clubs or Organizations: No    Attends Banker Meetings: Not on file    Marital Status: Not on file  Intimate Partner Violence: Not At Risk (06/24/2022)   Humiliation, Afraid, Rape, and Kick questionnaire    Fear of Current or Ex-Partner: No    Emotionally Abused: No    Physically Abused: No    Sexually Abused: No     Review of Systems:  All other review of systems negative except as mentioned in the HPI.  Physical Exam: Vital signs in last 24 hours: BP 134/82   Pulse 69   Temp (!) 97.3 F (  36.3 C) (Temporal)   Resp 15   Ht 6\' 1"  (1.854 m)   Wt 206 lb (93.4 kg)   SpO2 98%   BMI 27.18 kg/m  General:   Alert, NAD Lungs:  Clear .   Heart:  Regular rate and rhythm Abdomen:  Soft, nontender and nondistended. Neuro/Psych:  Alert and cooperative. Normal mood and affect. A and O x 3  Reviewed labs, radiology imaging, old records and pertinent past GI work up  Patient is appropriate for planned procedure(s) and anesthesia in an ambulatory setting   K. Scherry Ran , MD [phone]

## 2023-02-14 NOTE — Op Note (Signed)
[facility] Patient Name: Eric Vang Procedure Date: 02/14/2023 9:00 AM MRN: [id number] Endoscopist: Napoleon Form , MD, [id number] Age: 77 Referring MD:  Date of Birth: [birth year] Gender: Male Account #: [id number] Procedure:                Colonoscopy Indications:              High risk colon cancer surveillance: Personal                            history of colonic polyps Medicines:                Monitored Anesthesia Care Procedure:                Pre-Anesthesia Assessment:                           - Prior to the procedure, a History and Physical                            was performed, and patient medications and                            allergies were reviewed. The patient's tolerance of                            previous anesthesia was also reviewed. The risks                            and benefits of the procedure and the sedation                            options and risks were discussed with the patient.                            All questions were answered, and informed consent                            was obtained. Prior Anticoagulants: The patient has                            taken no anticoagulant or antiplatelet agents. ASA                            Grade Assessment: III - A patient with severe                            systemic disease. After reviewing the risks and                            benefits, the patient was deemed in satisfactory                            condition to undergo the procedure.  After obtaining informed consent, the colonoscope                            was passed under direct vision. Throughout the                            procedure, the patient's blood pressure, pulse, and                            oxygen saturations were monitored continuously. The                            [id number]-HQ190L Colonoscope [id number] was introduced                            through the anus and advanced  to the the cecum,                            identified by appendiceal orifice and ileocecal                            valve. The colonoscopy was performed without                            difficulty. The patient tolerated the procedure                            well. The quality of the bowel preparation was                            good. The ileocecal valve, appendiceal orifice, and                            rectum were photographed. Scope In: 9:09:43 AM Scope Out: 9:31:49 AM Scope Withdrawal Time: 0 hours 12 minutes 9 seconds  Total Procedure Duration: 0 hours 22 minutes 6 seconds  Findings:                 The perianal and digital rectal examinations were                            normal.                           Five sessile polyps were found in the transverse                            colon, ascending colon and cecum. The polyps were 3                            to 5 mm in size. These polyps were removed with a                            cold snare. Resection and retrieval were complete.  Non-bleeding external and internal hemorrhoids were                            found during retroflexion. The hemorrhoids were                            medium-sized. Complications:            No immediate complications. Estimated Blood Loss:     Estimated blood loss was minimal. Impression:               - Five 3 to 5 mm polyps in the transverse colon, in                            the ascending colon and in the cecum, removed with                            a cold snare. Resected and retrieved.                           - Non-bleeding external and internal hemorrhoids. Recommendation:           - Patient has a contact number available for                            emergencies. The signs and symptoms of potential                            delayed complications were discussed with the                            patient. Return to normal activities tomorrow.                             Written discharge instructions were provided to the                            patient.                           - Resume previous diet.                           - Continue present medications.                           - Await pathology results.                           - No repeat colonoscopy due to age.                           - Return to GI clinic PRN. Napoleon Form, MD 02/14/2023 9:40:10 AM This report has been signed electronically.

## 2023-02-15 ENCOUNTER — Telehealth: Payer: Self-pay

## 2023-02-15 ENCOUNTER — Encounter: Payer: Self-pay | Admitting: Internal Medicine

## 2023-02-15 ENCOUNTER — Ambulatory Visit: Payer: Medicare HMO | Admitting: Internal Medicine

## 2023-02-15 ENCOUNTER — Encounter: Payer: Self-pay | Admitting: Gastroenterology

## 2023-02-15 VITALS — BP 126/68 | HR 65 | Temp 97.5°F | Resp 16 | Ht 73.0 in | Wt 212.5 lb

## 2023-02-15 DIAGNOSIS — Z23 Encounter for immunization: Secondary | ICD-10-CM | POA: Diagnosis not present

## 2023-02-15 DIAGNOSIS — E785 Hyperlipidemia, unspecified: Secondary | ICD-10-CM | POA: Diagnosis not present

## 2023-02-15 DIAGNOSIS — Z0000 Encounter for general adult medical examination without abnormal findings: Secondary | ICD-10-CM

## 2023-02-15 LAB — CBC WITH DIFFERENTIAL/PLATELET
Basophils Absolute: 0 10*3/uL (ref 0.0–0.1)
Basophils Relative: 0.5 % (ref 0.0–3.0)
Eosinophils Absolute: 0.2 10*3/uL (ref 0.0–0.7)
Eosinophils Relative: 3 % (ref 0.0–5.0)
HCT: 43.7 % (ref 39.0–52.0)
Hemoglobin: 14.8 g/dL (ref 13.0–17.0)
Lymphocytes Relative: 25.9 % (ref 12.0–46.0)
Lymphs Abs: 1.3 10*3/uL (ref 0.7–4.0)
MCHC: 33.8 g/dL (ref 30.0–36.0)
MCV: 98.1 fL (ref 78.0–100.0)
Monocytes Absolute: 0.4 10*3/uL (ref 0.1–1.0)
Monocytes Relative: 8.5 % (ref 3.0–12.0)
Neutro Abs: 3.1 10*3/uL (ref 1.4–7.7)
Neutrophils Relative %: 62.1 % (ref 43.0–77.0)
Platelets: 259 10*3/uL (ref 150.0–400.0)
RBC: 4.45 Mil/uL (ref 4.22–5.81)
RDW: 13 % (ref 11.5–15.5)
WBC: 5 10*3/uL (ref 4.0–10.5)

## 2023-02-15 LAB — COMPREHENSIVE METABOLIC PANEL
ALT: 20 U/L (ref 0–53)
AST: 19 U/L (ref 0–37)
Albumin: 4 g/dL (ref 3.5–5.2)
Alkaline Phosphatase: 68 U/L (ref 39–117)
BUN: 14 mg/dL (ref 6–23)
CO2: 26 meq/L (ref 19–32)
Calcium: 8.9 mg/dL (ref 8.4–10.5)
Chloride: 106 meq/L (ref 96–112)
Creatinine, Ser: 0.93 mg/dL (ref 0.40–1.50)
GFR: 80.21 mL/min (ref >60.00)
Glucose, Bld: 87 mg/dL (ref 70–99)
Potassium: 3.9 meq/L (ref 3.5–5.1)
Sodium: 139 meq/L (ref 135–145)
Total Bilirubin: 0.9 mg/dL (ref 0.2–1.2)
Total Protein: 6 g/dL (ref 6.0–8.3)

## 2023-02-15 LAB — LIPID PANEL
Cholesterol: 148 mg/dL (ref 0–200)
HDL: 58.5 mg/dL (ref >39.00)
LDL Cholesterol: 73 mg/dL (ref 0–99)
NonHDL: 89.64
Total CHOL/HDL Ratio: 3
Triglycerides: 85 mg/dL (ref 0.0–149.0)
VLDL: 17 mg/dL (ref 0.0–40.0)

## 2023-02-15 NOTE — Telephone Encounter (Signed)
  Follow up Call-     02/14/2023    8:36 AM  Call back number  Post procedure Call Back phone  # [phone]  Permission to leave phone message Yes     Patient questions:  Do you have a fever, pain , or abdominal swelling? No. Pain Score  0 *  Have you tolerated food without any problems? Yes.    Have you been able to return to your normal activities? Yes.    Do you have any questions about your discharge instructions: Diet   No. Medications  No. Follow up visit  No.  Do you have questions or concerns about your Care? No.  Actions: * If pain score is 4 or above: No action needed, pain <4.

## 2023-02-15 NOTE — Progress Notes (Signed)
Subjective:    Patient ID: Eric Vang, male    DOB: 09/10/1945, 77 y.o.   MRN: [id number]  DOS:  02/15/2023 Type of visit - description: CPX  Here for CPX. In general feels very good. Denies chest pain or difficulty breathing. No palpitation no lower extremity edema. He exercises regularly without problems.   Review of Systems  Other than above, a 14 point review of systems is negative    Past Medical History:  Diagnosis Date   Anemia    Basal cell carcinoma of nose 2017   removed. a small area right eye   Bladder cancer (HCC)    BPH (benign prostatic hyperplasia)    CLUSTER HEADACHE 01/15/2007   2006, 2019   Cluster headaches    Coronary artery disease    DIVERTICULOSIS, COLON 02/04/2008   Elevated PSA    plan possible prostate Bx w/ urology   Erectile dysfunction    Hyperlipidemia    started statin 2015    Inguinal hernia    right   Insomnia    Scrotal varices    Spermatocele of epididymis, single    Unilateral inguinal hernia 2017    Past Surgical History:  Procedure Laterality Date   bladder removed     CARDIAC CATHETERIZATION     > 20 years ago   COLONOSCOPY      x 2 no polyps   COLONOSCOPY W/ POLYPECTOMY  2016 ish   INGUINAL HERNIA REPAIR Right 12/03/2015   Procedure: LAPAROSCOPIC RIGHT INGUINAL HERNIA REPAIR;  Surgeon: Gaynelle Adu, MD;  Location: [facility] OR;  Service: General;  Laterality: Right;   INSERTION OF MESH Right 12/03/2015   Procedure: INSERTION OF MESH;  Surgeon: Gaynelle Adu, MD;  Location: [facility] OR;  Service: General;  Laterality: Right;   MOHS SURGERY  2017   L nose    PROSTATE SURGERY     radical cystectomy (bladder, prostate) Bilateral 05/20/2021   @ [facility]   RADIOLOGY WITH ANESTHESIA N/A 05/01/2018   Procedure: MRI WITH ANESTHESIA   BRAIN WITH AND WITHOUT;  Surgeon: Radiologist, Medication, MD;  Location: [facility] OR;  Service: Radiology;  Laterality: N/A;   Social History   Socioeconomic History   Marital status: Married     Spouse name: Not on file   Number of children: 1   Years of education: Not on file   Highest education level: Bachelor's degree (e.g., BA, AB, BS)  Occupational History   Occupation: fully retired Market researcher  Tobacco Use   Smoking status: Former    Types: Cigarettes   Smokeless tobacco: Never   Tobacco comments:    stopped in 1979, was a light smoker  Vaping Use   Vaping status: Never Used  Substance and Sexual Activity   Alcohol use: Yes    Alcohol/week: 1.0 standard drink of alcohol    Types: 1 Cans of beer per week    Comment: occassionally   Drug use: No   Sexual activity: Not on file  Other Topics Concern   Not on file  Social History Narrative   Moved from [place] .   Lives home with wife, terri. 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  Caffeine 4 cups daily.     Step daughter is a Charity fundraiser   Sister in law [employer]    Exercise- walks qd    Social Determinants of Health   Financial Resource Strain: Low Risk  (07/28/2022)   Overall Financial  Resource Strain (CARDIA)    Difficulty of Paying Living Expenses: Not very hard  Food Insecurity: No Food Insecurity (07/28/2022)   Hunger Vital Sign    Worried About Running Out of Food in the Last Year: Never true    Ran Out of Food in the Last Year: Never true  Transportation Needs: No Transportation Needs (07/28/2022)   PRAPARE - Administrator, Civil Service (Medical): No    Lack of Transportation (Non-Medical): No  Physical Activity: Sufficiently Active (07/28/2022)   Exercise Vital Sign    Days of Exercise per Week: 6 days    Minutes of Exercise per Session: 60 min  Stress: Stress Concern Present (07/28/2022)   [employer] of Occupational Health - Occupational Stress Questionnaire    Feeling of Stress : To some extent  Social Connections: Unknown (07/28/2022)   Social Connection and Isolation Panel [NHANES]    Frequency of Communication with Friends and  Family: More than three times a week    Frequency of Social Gatherings with Friends and Family: Not on file    Attends Religious Services: Not on file    Active Member of Clubs or Organizations: No    Attends Banker Meetings: Not on file    Marital Status: Not on file  Intimate Partner Violence: Not At Risk (06/24/2022)   Humiliation, Afraid, Rape, and Kick questionnaire    Fear of Current or Ex-Partner: No    Emotionally Abused: No    Physically Abused: No    Sexually Abused: No    Current Outpatient Medications  Medication Instructions   simvastatin (ZOCOR) 10 mg, Oral, Daily   valACYclovir (VALTREX) 1,000 mg, Oral, 2 times daily PRN       Objective:   Physical Exam BP 126/68   Pulse 65   Temp (!) 97.5 F (36.4 C) (Oral)   Resp 16   Ht 6\' 1"  (1.854 m)   Wt 212 lb 8 oz (96.4 kg)   SpO2 98%   BMI 28.04 kg/m  General: Well developed, NAD, BMI noted Neck: No  thyromegaly  HEENT:  Normocephalic . Face symmetric, atraumatic Lungs:  CTA B Normal respiratory effort, no intercostal retractions, no accessory muscle use. Heart: RRR,  no murmur.  Abdomen: Soft, nondistended  Lower extremities: no pretibial edema bilaterally  Skin: Exposed areas without rash. Not pale. Not jaundice Neurologic:  alert & oriented X3.  Speech normal, gait appropriate for age and unassisted Strength symmetric and appropriate for age.  Psych: Cognition and judgment appear intact.  Cooperative with normal attention span and concentration.  Behavior appropriate. No anxious or depressed appearing.     Assessment     Assessment Hyperlipidemia , rx statins 2015 Insomnia BCC, nose 2017, sees derm q year Cold sores, prn Valtrex H/o cluster HAs 1 -- 2006. Had  sx again 2019, MRI 04/2018; topamax helped but had s/e (fatigue, mental fog) Urology -Elevated PSA 08-2015,  saw urology, DRE benign except for mild enlargement, PSA decreased, dx w/  Sx of bladder outlet  obstruction -Bladder cancer Dx 11-2020 [facility]).   -At [facility] on 05/20/2021: -CYSTECTOMY W/URETEROILEAL CONDUIT/SIGMOID BLADDER    -RADICAL PROSTATECTOMY;  w/  PELVIC LYMPHADENECTOMY, INCLUDING EXTERNAL ILIAC, HYPOGASTRIC, AND OBTURATOR  Echo stress stress 04-2021 at [facility]: Negative  PLAN: Here for CPX - Td 2016 -pnm 23: 2016; prevnar: 2017; PNM 20: Today - s/p zostavax  s/p shingrex -  s/p RSV recently  -  had a flu shot  and covid  vax recently --CCS: cscope 2007, cscope 02-2016 , + polyps , C-scope yesterday, next per GI --Prostate cancer screening: sees  urology, status post radical prostatectomy. --Lifestyle : cont to be very good  --Labs: CMP FLP CBC -Has a POA Hyperlipidemia: Well-controlled on simvastatin.  Checking labs BCC: Sees dermatology regularly. History of prostate cancer, doing very well, urology sees him now on yearly basis. Coronary risk: Patient concerned about  his CV  risk, no FH, no sxs , had a normal echo stress test 04-2021.  No further testing at this point  RTC 1 year

## 2023-02-15 NOTE — Assessment & Plan Note (Signed)
Here for CPX Hyperlipidemia: Well-controlled on simvastatin.  Checking labs BCC: Sees dermatology regularly. History of prostate cancer, doing very well, urology sees him now on yearly basis. Coronary risk: Patient concerned about  his CV  risk, no FH, no sxs , had a normal echo stress test 04-2021.  No further testing at this point  RTC 1 year

## 2023-02-15 NOTE — Assessment & Plan Note (Signed)
Here for CPX - Td 2016 -pnm 23: 2016; prevnar: 2017; PNM 20: Today - s/p zostavax  s/p shingrex -  s/p RSV recently  -  had a flu shot  and covid vax recently --CCS: cscope 2007, cscope 02-2016 , + polyps , C-scope yesterday, next per GI --Prostate cancer screening: sees  urology, status post radical prostatectomy. --Lifestyle : cont to be very good  --Labs: CMP FLP CBC -Has a POA

## 2023-02-15 NOTE — Patient Instructions (Signed)
      GO TO THE LAB : Get the blood work     Next visit with me in 4 to 6 months    Please schedule it at the front desk    STOP BY THE FIRST FLOOR:  get the XR       "Health Care Power of attorney" ,  "Living will" (Advance care planning documents)  If you already have a living will or healthcare power of attorney, is recommended you bring the copy to be scanned in your chart.   The document will be available to all the doctors you see in the system.  Advance care planning is a process that supports adults in  understanding and sharing their preferences regarding future medical care.  The patient's preferences are recorded in documents called Advance Directives and the can be modified at any time while the patient is in full mental capacity.   If you don't have one, please consider create one.      More information at: [email or web address]

## 2023-02-16 LAB — SURGICAL PATHOLOGY

## 2023-03-08 DIAGNOSIS — Z936 Other artificial openings of urinary tract status: Secondary | ICD-10-CM | POA: Diagnosis not present

## 2023-03-08 DIAGNOSIS — Z8551 Personal history of malignant neoplasm of bladder: Secondary | ICD-10-CM | POA: Diagnosis not present

## 2023-03-10 ENCOUNTER — Encounter: Payer: Self-pay | Admitting: Gastroenterology

## 2023-03-23 DIAGNOSIS — R69 Illness, unspecified: Secondary | ICD-10-CM | POA: Diagnosis not present

## 2023-04-09 DIAGNOSIS — H547 Unspecified visual loss: Secondary | ICD-10-CM | POA: Diagnosis not present

## 2023-04-09 DIAGNOSIS — H3561 Retinal hemorrhage, right eye: Secondary | ICD-10-CM | POA: Diagnosis not present

## 2023-04-12 DIAGNOSIS — I517 Cardiomegaly: Secondary | ICD-10-CM | POA: Diagnosis not present

## 2023-04-12 DIAGNOSIS — R918 Other nonspecific abnormal finding of lung field: Secondary | ICD-10-CM | POA: Diagnosis not present

## 2023-04-12 DIAGNOSIS — Z87891 Personal history of nicotine dependence: Secondary | ICD-10-CM | POA: Diagnosis not present

## 2023-04-12 DIAGNOSIS — Q676 Pectus excavatum: Secondary | ICD-10-CM | POA: Diagnosis not present

## 2023-04-12 DIAGNOSIS — C679 Malignant neoplasm of bladder, unspecified: Secondary | ICD-10-CM | POA: Diagnosis not present

## 2023-04-14 DIAGNOSIS — H353121 Nonexudative age-related macular degeneration, left eye, early dry stage: Secondary | ICD-10-CM | POA: Diagnosis not present

## 2023-04-14 DIAGNOSIS — H353211 Exudative age-related macular degeneration, right eye, with active choroidal neovascularization: Secondary | ICD-10-CM | POA: Diagnosis not present

## 2023-04-14 DIAGNOSIS — H3560 Retinal hemorrhage, unspecified eye: Secondary | ICD-10-CM | POA: Diagnosis not present

## 2023-04-14 DIAGNOSIS — H3561 Retinal hemorrhage, right eye: Secondary | ICD-10-CM | POA: Diagnosis not present

## 2023-05-17 DIAGNOSIS — H353121 Nonexudative age-related macular degeneration, left eye, early dry stage: Secondary | ICD-10-CM | POA: Diagnosis not present

## 2023-05-17 DIAGNOSIS — H353211 Exudative age-related macular degeneration, right eye, with active choroidal neovascularization: Secondary | ICD-10-CM | POA: Diagnosis not present

## 2023-05-17 DIAGNOSIS — H3560 Retinal hemorrhage, unspecified eye: Secondary | ICD-10-CM | POA: Diagnosis not present

## 2023-05-23 ENCOUNTER — Telehealth: Payer: Self-pay | Admitting: Internal Medicine

## 2023-05-23 NOTE — Telephone Encounter (Signed)
 Copied from CRM [id number]. Topic: Medicare AWV >> May 23, 2023 11:32 AM Payton Doughty wrote: Reason for CRM: Called LVM 05/23/2023 to schedule AWV. Please schedule Virtual or Telehealth visits ONLY.   Verlee Rossetti; Care Guide Ambulatory Clinical Support [place] [facility] Direct Dial: [phone]

## 2023-06-04 ENCOUNTER — Other Ambulatory Visit: Payer: Self-pay | Admitting: Internal Medicine

## 2023-06-19 DIAGNOSIS — H3560 Retinal hemorrhage, unspecified eye: Secondary | ICD-10-CM | POA: Diagnosis not present

## 2023-06-19 DIAGNOSIS — H353121 Nonexudative age-related macular degeneration, left eye, early dry stage: Secondary | ICD-10-CM | POA: Diagnosis not present

## 2023-06-19 DIAGNOSIS — H353211 Exudative age-related macular degeneration, right eye, with active choroidal neovascularization: Secondary | ICD-10-CM | POA: Diagnosis not present

## 2023-07-07 DIAGNOSIS — Z8551 Personal history of malignant neoplasm of bladder: Secondary | ICD-10-CM | POA: Diagnosis not present

## 2023-07-07 DIAGNOSIS — Z936 Other artificial openings of urinary tract status: Secondary | ICD-10-CM | POA: Diagnosis not present

## 2023-07-17 ENCOUNTER — Ambulatory Visit (INDEPENDENT_AMBULATORY_CARE_PROVIDER_SITE_OTHER): Payer: Medicare (Managed Care) | Admitting: Internal Medicine

## 2023-07-17 ENCOUNTER — Ambulatory Visit (INDEPENDENT_AMBULATORY_CARE_PROVIDER_SITE_OTHER): Payer: Medicare (Managed Care) | Admitting: *Deleted

## 2023-07-17 ENCOUNTER — Encounter: Payer: Self-pay | Admitting: Internal Medicine

## 2023-07-17 VITALS — BP 128/73 | HR 67 | Ht 73.0 in | Wt 215.0 lb

## 2023-07-17 DIAGNOSIS — Z0000 Encounter for general adult medical examination without abnormal findings: Secondary | ICD-10-CM | POA: Diagnosis not present

## 2023-07-17 DIAGNOSIS — C679 Malignant neoplasm of bladder, unspecified: Secondary | ICD-10-CM

## 2023-07-17 NOTE — Patient Instructions (Signed)
 INSTRUCTIONS  FOR TODAY    Next office visit for a physical by 02/2024 Please make an appointment before you leave today

## 2023-07-17 NOTE — Progress Notes (Signed)
 Visit scheduled on error.  Next OV 02-2024, CPX

## 2023-07-17 NOTE — Patient Instructions (Signed)
 [name] , Thank you for taking time to come for your Medicare Wellness Visit. I appreciate your ongoing commitment to your health goals. Please review the following plan we discussed and let me know if I can assist you in the future.      This is a list of the screening recommended for you and due dates:  Health Maintenance  Topic Date Due   COVID-19 Vaccine (8 - Moderna risk 2024-25 season) 01/13/2024*   Flu Shot  11/03/2023   Medicare Annual Wellness Visit  07/16/2024   DTaP/Tdap/Td vaccine (3 - Td or Tdap) 11/11/2024   Pneumonia Vaccine  Completed   Hepatitis C Screening  Completed   Zoster (Shingles) Vaccine  Completed   HPV Vaccine  Aged Out   Meningitis B Vaccine  Aged Out   Colon Cancer Screening  Discontinued  *Topic was postponed. The date shown is not the original due date.     Next appointment: Follow up in one year for your annual wellness visit.   Preventive Care 59 Years and Older, Male Preventive care refers to lifestyle choices and visits with your health care provider that can promote health and wellness. What does preventive care include? A yearly physical exam. This is also called an annual well check. Dental exams once or twice a year. Routine eye exams. Ask your health care provider how often you should have your eyes checked. Personal lifestyle choices, including: Daily care of your teeth and gums. Regular physical activity. Eating a healthy diet. Avoiding tobacco and drug use. Limiting alcohol use. Practicing safe sex. Taking low doses of aspirin every day. Taking vitamin and mineral supplements as recommended by your health care provider. What happens during an annual well check? The services and screenings done by your health care provider during your annual well check will depend on your age, overall health, lifestyle risk factors, and family history of disease. Counseling  Your health care provider may ask you questions about your: Alcohol  use. Tobacco use. Drug use. Emotional well-being. Home and relationship well-being. Sexual activity. Eating habits. History of falls. Memory and ability to understand (cognition). Work and work Astronomer. Screening  You may have the following tests or measurements: Height, weight, and BMI. Blood pressure. Lipid and cholesterol levels. These may be checked every 5 years, or more frequently if you are over 87 years old. Skin check. Lung cancer screening. You may have this screening every year starting at age 37 if you have a 30-pack-year history of smoking and currently smoke or have quit within the past 15 years. Fecal occult blood test (FOBT) of the stool. You may have this test every year starting at age 109. Flexible sigmoidoscopy or colonoscopy. You may have a sigmoidoscopy every 5 years or a colonoscopy every 10 years starting at age 31. Prostate cancer screening. Recommendations will vary depending on your family history and other risks. Hepatitis C blood test. Hepatitis B blood test. Sexually transmitted disease (STD) testing. Diabetes screening. This is done by checking your blood sugar (glucose) after you have not eaten for a while (fasting). You may have this done every 1-3 years. Abdominal aortic aneurysm (AAA) screening. You may need this if you are a current or former smoker. Osteoporosis. You may be screened starting at age 5 if you are at high risk. Talk with your health care provider about your test results, treatment options, and if necessary, the need for more tests. Vaccines  Your health care provider may recommend certain vaccines, such  as: Influenza vaccine. This is recommended every year. Tetanus, diphtheria, and acellular pertussis (Tdap, Td) vaccine. You may need a Td booster every 10 years. Zoster vaccine. You may need this after age 49. Pneumococcal 13-valent conjugate (PCV13) vaccine. One dose is recommended after age 20. Pneumococcal polysaccharide  (PPSV23) vaccine. One dose is recommended after age 32. Talk to your health care provider about which screenings and vaccines you need and how often you need them. This information is not intended to replace advice given to you by your health care provider. Make sure you discuss any questions you have with your health care provider. Document Released: 04/17/2015 Document Revised: 12/09/2015 Document Reviewed: 01/20/2015 Elsevier Interactive Patient Education  2017 [employer].  Fall Prevention in the Home Falls can cause injuries. They can happen to people of all ages. There are many things you can do to make your home safe and to help prevent falls. What can I do on the outside of my home? Regularly fix the edges of walkways and driveways and fix any cracks. Remove anything that might make you trip as you walk through a door, such as a raised step or threshold. Trim any bushes or trees on the path to your home. Use bright outdoor lighting. Clear any walking paths of anything that might make someone trip, such as rocks or tools. Regularly check to see if handrails are loose or broken. Make sure that both sides of any steps have handrails. Any raised decks and porches should have guardrails on the edges. Have any leaves, snow, or ice cleared regularly. Use sand or salt on walking paths during winter. Clean up any spills in your garage right away. This includes oil or grease spills. What can I do in the bathroom? Use night lights. Install grab bars by the toilet and in the tub and shower. Do not use towel bars as grab bars. Use non-skid mats or decals in the tub or shower. If you need to sit down in the shower, use a plastic, non-slip stool. Keep the floor dry. Clean up any water that spills on the floor as soon as it happens. Remove soap buildup in the tub or shower regularly. Attach bath mats securely with double-sided non-slip rug tape. Do not have throw rugs and other things on the  floor that can make you trip. What can I do in the bedroom? Use night lights. Make sure that you have a light by your bed that is easy to reach. Do not use any sheets or blankets that are too big for your bed. They should not hang down onto the floor. Have a firm chair that has side arms. You can use this for support while you get dressed. Do not have throw rugs and other things on the floor that can make you trip. What can I do in the kitchen? Clean up any spills right away. Avoid walking on wet floors. Keep items that you use a lot in easy-to-reach places. If you need to reach something above you, use a strong step stool that has a grab bar. Keep electrical cords out of the way. Do not use floor polish or wax that makes floors slippery. If you must use wax, use non-skid floor wax. Do not have throw rugs and other things on the floor that can make you trip. What can I do with my stairs? Do not leave any items on the stairs. Make sure that there are handrails on both sides of the stairs and use  them. Fix handrails that are broken or loose. Make sure that handrails are as long as the stairways. Check any carpeting to make sure that it is firmly attached to the stairs. Fix any carpet that is loose or worn. Avoid having throw rugs at the top or bottom of the stairs. If you do have throw rugs, attach them to the floor with carpet tape. Make sure that you have a light switch at the top of the stairs and the bottom of the stairs. If you do not have them, ask someone to add them for you. What else can I do to help prevent falls? Wear shoes that: Do not have high heels. Have rubber bottoms. Are comfortable and fit you well. Are closed at the toe. Do not wear sandals. If you use a stepladder: Make sure that it is fully opened. Do not climb a closed stepladder. Make sure that both sides of the stepladder are locked into place. Ask someone to hold it for you, if possible. Clearly mark and make  sure that you can see: Any grab bars or handrails. First and last steps. Where the edge of each step is. Use tools that help you move around (mobility aids) if they are needed. These include: Canes. Walkers. Scooters. Crutches. Turn on the lights when you go into a dark area. Replace any light bulbs as soon as they burn out. Set up your furniture so you have a clear path. Avoid moving your furniture around. If any of your floors are uneven, fix them. If there are any pets around you, be aware of where they are. Review your medicines with your doctor. Some medicines can make you feel dizzy. This can increase your chance of falling. Ask your doctor what other things that you can do to help prevent falls. This information is not intended to replace advice given to you by your health care provider. Make sure you discuss any questions you have with your health care provider. Document Released: 01/15/2009 Document Revised: 08/27/2015 Document Reviewed: 04/25/2014 Elsevier Interactive Patient Education  2017 [employer].

## 2023-07-17 NOTE — Progress Notes (Signed)
 Subjective:   Eric Vang is a 78 y.o. male who presents for Medicare Annual/Subsequent preventive examination.  Visit Complete: In person  Patient Medicare AWV questionnaire was completed by the patient on 07/16/23; I have confirmed that all information answered by patient is correct and no changes since this date.  Cardiac Risk Factors include: advanced age (>61men, >92 women);male gender;dyslipidemia     Objective:    Today's Vitals   07/17/23 0944  BP: 128/73  Pulse: 67  Weight: 215 lb (97.5 kg)  Height: 6\' 1"  (1.854 m)   Body mass index is 28.37 kg/m.     07/17/2023    9:50 AM 06/24/2022    2:21 PM 06/08/2021    3:09 PM 04/04/2021    1:00 AM 04/02/2021    5:38 PM 03/30/2021    9:22 PM 05/01/2018    7:41 AM  Advanced Directives  Does Patient Have a Medical Advance Directive? Yes Yes Yes Yes No No No  Type of Estate agent of [place];Living will Healthcare Power of [place];Living will;Out of facility DNR (pink MOST or yellow form) Healthcare Power of [place];Living will;Out of facility DNR (pink MOST or yellow form) Healthcare Power of Attorney     Does patient want to make changes to medical advance directive? No - Patient declined No - Patient declined  No - Patient declined     Copy of Healthcare Power of Attorney in Chart? No - copy requested No - copy requested No - copy requested No - copy requested     Would patient like information on creating a medical advance directive?    No - Patient declined   No - Patient declined    Current Medications (verified) Outpatient Encounter Medications as of 07/17/2023  Medication Sig   simvastatin (ZOCOR) 10 MG tablet Take 1 tablet (10 mg total) by mouth daily.   valACYclovir (VALTREX) 1000 MG tablet Take 1 tablet (1,000 mg total) by mouth 2 (two) times daily as needed (for cold sores per 5 days).   No facility-administered encounter medications on file as of 07/17/2023.    Allergies  (verified) Patient has no known allergies.   History: Past Medical History:  Diagnosis Date   Anemia    Basal cell carcinoma of nose 2017   removed. a small area right eye   Bladder cancer (HCC)    BPH (benign prostatic hyperplasia)    CLUSTER HEADACHE 01/15/2007   2006, 2019   Cluster headaches    Coronary artery disease    DIVERTICULOSIS, COLON 02/04/2008   Elevated PSA    plan possible prostate Bx w/ urology   Erectile dysfunction    Hyperlipidemia N/A   started statin 2015    Inguinal hernia    right   Insomnia    Scrotal varices    Spermatocele of epididymis, single    Unilateral inguinal hernia 2017   Past Surgical History:  Procedure Laterality Date   bladder removed     CARDIAC CATHETERIZATION     > 20 years ago   COLONOSCOPY      x 2 no polyps   COLONOSCOPY W/ POLYPECTOMY  2016 ish   HERNIA REPAIR  11/2015   INGUINAL HERNIA REPAIR Right 12/03/2015   Procedure: LAPAROSCOPIC RIGHT INGUINAL HERNIA REPAIR;  Surgeon: Gaynelle Adu, MD;  Location: [facility] OR;  Service: General;  Laterality: Right;   INSERTION OF MESH Right 12/03/2015   Procedure: INSERTION OF MESH;  Surgeon: Gaynelle Adu, MD;  Location: [facility]  OR;  Service: General;  Laterality: Right;   MOHS SURGERY  2017   L nose    PROSTATE SURGERY     radical cystectomy (bladder, prostate) Bilateral 05/20/2021   @ [facility]   RADIOLOGY WITH ANESTHESIA N/A 05/01/2018   Procedure: MRI WITH ANESTHESIA   BRAIN WITH AND WITHOUT;  Surgeon: Radiologist, Medication, MD;  Location: [facility] OR;  Service: Radiology;  Laterality: N/A;   Family History  Problem Relation Age of Onset   Diabetes Mother 27       natural causes.    Atrial fibrillation Mother    Uterine cancer Mother    Cancer Father 22       bone    Cancer Paternal Grandmother        sinus cancer   Colon cancer Neg Hx    Prostate cancer Neg Hx    CAD Neg Hx    Cancer - Colon Neg Hx    Social History   Socioeconomic History   Marital status: Married    Spouse name:  Not on file   Number of children: 1   Years of education: Not on file   Highest education level: Bachelor's degree (e.g., BA, AB, BS)  Occupational History   Occupation: fully retired Market researcher  Tobacco Use   Smoking status: Former    Types: Cigarettes   Smokeless tobacco: Never   Tobacco comments:    stopped in 1979, was a light smoker  Vaping Use   Vaping status: Never Used  Substance and Sexual Activity   Alcohol use: Not Currently    Alcohol/week: 1.0 standard drink of alcohol    Comment: occassionally   Drug use: No   Sexual activity: Not Currently  Other Topics Concern   Not on file  Social History Narrative   Moved from [place] .   Lives home with wife, terri. 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  Caffeine 4 cups daily.     Step daughter is a Charity fundraiser   Sister in law [employer]    Exercise- walks qd    Social Drivers of Health   Financial Resource Strain: Low Risk  (07/16/2023)   Overall Financial Resource Strain (CARDIA)    Difficulty of Paying Living Expenses: Not hard at all  Food Insecurity: No Food Insecurity (07/16/2023)   Hunger Vital Sign    Worried About Running Out of Food in the Last Year: Never true    Ran Out of Food in the Last Year: Never true  Transportation Needs: No Transportation Needs (07/16/2023)   PRAPARE - Administrator, Civil Service (Medical): No    Lack of Transportation (Non-Medical): No  Physical Activity: Sufficiently Active (07/16/2023)   Exercise Vital Sign    Days of Exercise per Week: 6 days    Minutes of Exercise per Session: 60 min  Stress: No Stress Concern Present (07/16/2023)   [employer] of Occupational Health - Occupational Stress Questionnaire    Feeling of Stress : Not at all  Social Connections: Socially Integrated (07/16/2023)   Social Connection and Isolation Panel [NHANES]    Frequency of Communication with Friends and Family: Twice a week     Frequency of Social Gatherings with Friends and Family: Once a week    Attends Religious Services: More than 4 times per year    Active Member of [employer] or Organizations: Yes    Attends Banker Meetings: More than 4 times per  year    Marital Status: Married    Tobacco Counseling Counseling given: Not Answered Tobacco comments: stopped in 1979, was a light smoker   Clinical Intake:  Pre-visit preparation completed: Yes  Pain : No/denies pain     BMI - recorded: 28.37 Nutritional Status: BMI 25 -29 Overweight Nutritional Risks: None Diabetes: No  How often do you need to have someone help you when you read instructions, pamphlets, or other written materials from your doctor or pharmacy?: 1 - Never  Interpreter Needed?: No  Information entered by :: Peter Brands, CMA   Activities of Daily Living    07/16/2023    1:40 PM  In your present state of health, do you have any difficulty performing the following activities:  Hearing? 0  Vision? 1  Difficulty concentrating or making decisions? 0  Walking or climbing stairs? 0  Dressing or bathing? 0  Doing errands, shopping? 0  Preparing Food and eating ? N  Using the Toilet? N  In the past six months, have you accidently leaked urine? N  Do you have problems with loss of bowel control? N  Managing your Medications? N  Managing your Finances? N  Housekeeping or managing your Housekeeping? N    Patient Care Team: Ezell Hollow, MD as PCP - General (Internal Medicine) Aldean Hummingbird, MD as Consulting Physician (General Surgery) Omega Bible, MD as Consulting Physician (Neurology) Sherlyn Ditto, MD (Inactive) as Consulting Physician (Urology)  Indicate any recent Medical Services you may have received from other than [facility] providers in the past year (date may be approximate).     Assessment:   This is a routine wellness examination for Eric Vang.  Hearing/Vision screen No results found.   Goals  Addressed   None    Depression Screen    07/17/2023    9:50 AM 02/15/2023    9:58 AM 08/02/2022   10:55 AM 06/24/2022    2:24 PM 01/28/2022   10:42 AM 12/23/2021    8:04 AM 10/27/2021    3:11 PM  PHQ 2/9 Scores  PHQ - 2 Score 0 0 0 0 0 0 0    Fall Risk    07/16/2023    1:40 PM 02/15/2023    9:58 AM 08/02/2022   10:54 AM 06/24/2022    2:22 PM 01/28/2022   10:42 AM  Fall Risk   Falls in the past year? 0 0 0 0 0  Number falls in past yr: 0 0 0 0 0  Injury with Fall? 0 0 0 0 0  Risk for fall due to : No Fall Risks   No Fall Risks   Follow up Falls evaluation completed Falls evaluation completed Falls evaluation completed Falls evaluation completed Falls evaluation completed    MEDICARE RISK AT HOME: Medicare Risk at Home Any stairs in or around the home?: (Patient-Rptd) Yes If so, are there any without handrails?: (Patient-Rptd) No Home free of loose throw rugs in walkways, pet beds, electrical cords, etc?: (Patient-Rptd) Yes Adequate lighting in your home to reduce risk of falls?: (Patient-Rptd) Yes Life alert?: (Patient-Rptd) No Use of a cane, walker or w/c?: (Patient-Rptd) No Grab bars in the bathroom?: (Patient-Rptd) No Shower chair or bench in shower?: (Patient-Rptd) No Elevated toilet seat or a handicapped toilet?: (Patient-Rptd) No  TIMED UP AND GO:  Was the test performed?  Yes  Length of time to ambulate 10 feet: 6 sec Gait steady and fast without use of assistive device    Cognitive  Function:        07/17/2023    9:52 AM 06/24/2022    2:39 PM 06/08/2021    3:15 PM  6CIT Screen  What Year? 0 points 0 points 0 points  What month? 0 points 0 points 0 points  What time? 0 points 0 points 0 points  Count back from 20 0 points 0 points 0 points  Months in reverse 0 points 0 points 0 points  Repeat phrase 0 points 0 points 0 points  Total Score 0 points 0 points 0 points    Immunizations Immunization History  Administered Date(s) Administered   Influenza  Split 01/02/2021, 01/06/2022, 01/16/2023   Influenza Whole 02/04/2008, 01/02/2009   Influenza, High Dose Seasonal PF 02/19/2015, 12/29/2018   Influenza-Unspecified 01/17/2016, 01/08/2017, 01/22/2018   Moderna Covid-19 Vaccine Bivalent Booster 79yrs & up 02/02/2021, 01/22/2022   Moderna Sars-Covid-2 Vaccination 05/27/2019, 06/25/2019, 01/24/2020, 07/09/2020   PNEUMOCOCCAL CONJUGATE-20 02/15/2023   Pfizer(Comirnaty)Fall Seasonal Vaccine 12 years and older 01/16/2023   Pneumococcal Conjugate-13 08/12/2015   Pneumococcal Polysaccharide-23 08/09/2014   Respiratory Syncytial Virus Vaccine,Recomb Aduvanted(Arexvy) 01/06/2022   Td 04/04/2002   Tdap 11/12/2014   Zoster Recombinant(Shingrix) 08/02/2016, 10/06/2016   Zoster, Live 04/24/2007    TDAP status: Up to date  Flu Vaccine status: Up to date  Pneumococcal vaccine status: Up to date  Covid-19 vaccine status: Information provided on how to obtain vaccines.   Qualifies for Shingles Vaccine? Yes   Zostavax completed Yes   Shingrix Completed?: Yes  Screening Tests Health Maintenance  Topic Date Due   Medicare Annual Wellness (AWV)  06/24/2023   COVID-19 Vaccine (8 - Moderna risk 2024-25 season) 01/13/2024 (Originally 07/17/2023)   INFLUENZA VACCINE  11/03/2023   DTaP/Tdap/Td (3 - Td or Tdap) 11/11/2024   Pneumonia Vaccine 22+ Years old  Completed   Hepatitis C Screening  Completed   Zoster Vaccines- Shingrix  Completed   HPV VACCINES  Aged Out   Meningococcal B Vaccine  Aged Out   Colonoscopy  Discontinued    Health Maintenance  Health Maintenance Due  Topic Date Due   Medicare Annual Wellness (AWV)  06/24/2023    Colorectal cancer screening: No longer required.   Lung Cancer Screening: (Low Dose CT Chest recommended if Age 66-80 years, 20 pack-year currently smoking OR have quit w/in 15years.) does not qualify.    Additional Screening:  Hepatitis C Screening: does qualify; Completed 08/17/16  Vision Screening:  Recommended annual ophthalmology exams for early detection of glaucoma and other disorders of the eye. Is the patient up to date with their annual eye exam?  Yes  Who is the provider or what is the name of the office in which the patient attends annual eye exams? Dr. Marleen Silvius If pt is not established with a provider, would they like to be referred to a provider to establish care? No .   Dental Screening: Recommended annual dental exams for proper oral hygiene  Diabetic Foot Exam: N/a  Community Resource Referral / Chronic Care Management: CRR required this visit?  No   CCM required this visit?  No     Plan:     I have personally reviewed and noted the following in the patient's chart:   Medical and social history Use of alcohol, tobacco or illicit drugs  Current medications and supplements including opioid prescriptions. Patient is not currently taking opioid prescriptions. Functional ability and status Nutritional status Physical activity Advanced directives List of other physicians Hospitalizations, surgeries, and ER visits in previous  12 months Vitals Screenings to include cognitive, depression, and falls Referrals and appointments  In addition, I have reviewed and discussed with patient certain preventive protocols, quality metrics, and best practice recommendations. A written personalized care plan for preventive services as well as general preventive health recommendations were provided to patient.     Peter Brands, CMA   07/17/2023   After Visit Summary: (In Person-Declined) Patient declined AVS at this time.  Nurse Notes: None

## 2023-07-24 DIAGNOSIS — H353211 Exudative age-related macular degeneration, right eye, with active choroidal neovascularization: Secondary | ICD-10-CM | POA: Diagnosis not present

## 2023-08-07 DIAGNOSIS — Z8551 Personal history of malignant neoplasm of bladder: Secondary | ICD-10-CM | POA: Diagnosis not present

## 2023-08-07 DIAGNOSIS — Z936 Other artificial openings of urinary tract status: Secondary | ICD-10-CM | POA: Diagnosis not present

## 2023-08-23 DIAGNOSIS — H353211 Exudative age-related macular degeneration, right eye, with active choroidal neovascularization: Secondary | ICD-10-CM | POA: Diagnosis not present

## 2023-09-07 DIAGNOSIS — Z8551 Personal history of malignant neoplasm of bladder: Secondary | ICD-10-CM | POA: Diagnosis not present

## 2023-09-07 DIAGNOSIS — Z936 Other artificial openings of urinary tract status: Secondary | ICD-10-CM | POA: Diagnosis not present

## 2023-09-30 DIAGNOSIS — Z8551 Personal history of malignant neoplasm of bladder: Secondary | ICD-10-CM | POA: Diagnosis not present

## 2023-09-30 DIAGNOSIS — Z936 Other artificial openings of urinary tract status: Secondary | ICD-10-CM | POA: Diagnosis not present

## 2023-10-09 DIAGNOSIS — H02831 Dermatochalasis of right upper eyelid: Secondary | ICD-10-CM | POA: Diagnosis not present

## 2023-10-09 DIAGNOSIS — H353122 Nonexudative age-related macular degeneration, left eye, intermediate dry stage: Secondary | ICD-10-CM | POA: Diagnosis not present

## 2023-10-09 DIAGNOSIS — H353211 Exudative age-related macular degeneration, right eye, with active choroidal neovascularization: Secondary | ICD-10-CM | POA: Insufficient documentation

## 2023-10-09 DIAGNOSIS — H2513 Age-related nuclear cataract, bilateral: Secondary | ICD-10-CM | POA: Diagnosis not present

## 2023-10-09 DIAGNOSIS — H02834 Dermatochalasis of left upper eyelid: Secondary | ICD-10-CM | POA: Diagnosis not present

## 2023-10-30 DIAGNOSIS — Z936 Other artificial openings of urinary tract status: Secondary | ICD-10-CM | POA: Diagnosis not present

## 2023-10-30 DIAGNOSIS — Z8551 Personal history of malignant neoplasm of bladder: Secondary | ICD-10-CM | POA: Diagnosis not present

## 2023-11-28 ENCOUNTER — Other Ambulatory Visit: Payer: Self-pay | Admitting: Internal Medicine

## 2023-11-30 DIAGNOSIS — Z8551 Personal history of malignant neoplasm of bladder: Secondary | ICD-10-CM | POA: Diagnosis not present

## 2023-11-30 DIAGNOSIS — Z936 Other artificial openings of urinary tract status: Secondary | ICD-10-CM | POA: Diagnosis not present

## 2023-12-11 DIAGNOSIS — H2513 Age-related nuclear cataract, bilateral: Secondary | ICD-10-CM | POA: Diagnosis not present

## 2023-12-11 DIAGNOSIS — H02831 Dermatochalasis of right upper eyelid: Secondary | ICD-10-CM | POA: Diagnosis not present

## 2023-12-11 DIAGNOSIS — H353211 Exudative age-related macular degeneration, right eye, with active choroidal neovascularization: Secondary | ICD-10-CM | POA: Diagnosis not present

## 2023-12-11 DIAGNOSIS — H353122 Nonexudative age-related macular degeneration, left eye, intermediate dry stage: Secondary | ICD-10-CM | POA: Diagnosis not present

## 2023-12-11 DIAGNOSIS — H02834 Dermatochalasis of left upper eyelid: Secondary | ICD-10-CM | POA: Diagnosis not present

## 2024-01-01 DIAGNOSIS — Z936 Other artificial openings of urinary tract status: Secondary | ICD-10-CM | POA: Diagnosis not present

## 2024-01-01 DIAGNOSIS — Z8551 Personal history of malignant neoplasm of bladder: Secondary | ICD-10-CM | POA: Diagnosis not present

## 2024-01-31 DIAGNOSIS — Z936 Other artificial openings of urinary tract status: Secondary | ICD-10-CM | POA: Diagnosis not present

## 2024-01-31 DIAGNOSIS — Z8551 Personal history of malignant neoplasm of bladder: Secondary | ICD-10-CM | POA: Diagnosis not present

## 2024-02-12 DIAGNOSIS — H02831 Dermatochalasis of right upper eyelid: Secondary | ICD-10-CM | POA: Diagnosis not present

## 2024-02-12 DIAGNOSIS — H35362 Drusen (degenerative) of macula, left eye: Secondary | ICD-10-CM | POA: Diagnosis not present

## 2024-02-12 DIAGNOSIS — H353122 Nonexudative age-related macular degeneration, left eye, intermediate dry stage: Secondary | ICD-10-CM | POA: Diagnosis not present

## 2024-02-12 DIAGNOSIS — H353211 Exudative age-related macular degeneration, right eye, with active choroidal neovascularization: Secondary | ICD-10-CM | POA: Diagnosis not present

## 2024-02-12 DIAGNOSIS — H02834 Dermatochalasis of left upper eyelid: Secondary | ICD-10-CM | POA: Diagnosis not present

## 2024-02-12 DIAGNOSIS — H2513 Age-related nuclear cataract, bilateral: Secondary | ICD-10-CM | POA: Diagnosis not present

## 2024-02-20 ENCOUNTER — Encounter: Payer: Medicare (Managed Care) | Admitting: Internal Medicine

## 2024-02-28 DIAGNOSIS — Z8551 Personal history of malignant neoplasm of bladder: Secondary | ICD-10-CM | POA: Diagnosis not present

## 2024-02-28 DIAGNOSIS — Z936 Other artificial openings of urinary tract status: Secondary | ICD-10-CM | POA: Diagnosis not present

## 2024-04-26 ENCOUNTER — Ambulatory Visit (INDEPENDENT_AMBULATORY_CARE_PROVIDER_SITE_OTHER): Payer: Medicare (Managed Care) | Admitting: Internal Medicine

## 2024-04-26 ENCOUNTER — Encounter: Payer: Self-pay | Admitting: Internal Medicine

## 2024-04-26 VITALS — BP 126/66 | HR 72 | Temp 97.9°F | Resp 18 | Ht 73.0 in | Wt 215.0 lb

## 2024-04-26 DIAGNOSIS — E010 Iodine-deficiency related diffuse (endemic) goiter: Secondary | ICD-10-CM | POA: Diagnosis not present

## 2024-04-26 DIAGNOSIS — I4891 Unspecified atrial fibrillation: Secondary | ICD-10-CM

## 2024-04-26 DIAGNOSIS — H353211 Exudative age-related macular degeneration, right eye, with active choroidal neovascularization: Secondary | ICD-10-CM | POA: Diagnosis not present

## 2024-04-26 DIAGNOSIS — Z0001 Encounter for general adult medical examination with abnormal findings: Secondary | ICD-10-CM

## 2024-04-26 DIAGNOSIS — C679 Malignant neoplasm of bladder, unspecified: Secondary | ICD-10-CM

## 2024-04-26 DIAGNOSIS — Z0000 Encounter for general adult medical examination without abnormal findings: Secondary | ICD-10-CM | POA: Diagnosis not present

## 2024-04-26 DIAGNOSIS — I499 Cardiac arrhythmia, unspecified: Secondary | ICD-10-CM

## 2024-04-26 DIAGNOSIS — E785 Hyperlipidemia, unspecified: Secondary | ICD-10-CM

## 2024-04-26 LAB — BASIC METABOLIC PANEL WITH GFR
BUN: 19 mg/dL (ref 6–23)
CO2: 27 meq/L (ref 19–32)
Calcium: 9.6 mg/dL (ref 8.4–10.5)
Chloride: 106 meq/L (ref 96–112)
Creatinine, Ser: 0.96 mg/dL (ref 0.40–1.50)
GFR: 76.57 mL/min
Glucose, Bld: 94 mg/dL (ref 70–99)
Potassium: 4.2 meq/L (ref 3.5–5.1)
Sodium: 139 meq/L (ref 135–145)

## 2024-04-26 LAB — CBC WITH DIFFERENTIAL/PLATELET
Basophils Absolute: 0 K/uL (ref 0.0–0.1)
Basophils Relative: 0.5 % (ref 0.0–3.0)
Eosinophils Absolute: 0.1 K/uL (ref 0.0–0.7)
Eosinophils Relative: 1.9 % (ref 0.0–5.0)
HCT: 45.9 % (ref 39.0–52.0)
Hemoglobin: 15.8 g/dL (ref 13.0–17.0)
Lymphocytes Relative: 25.8 % (ref 12.0–46.0)
Lymphs Abs: 1.3 K/uL (ref 0.7–4.0)
MCHC: 34.4 g/dL (ref 30.0–36.0)
MCV: 97.5 fl (ref 78.0–100.0)
Monocytes Absolute: 0.5 K/uL (ref 0.1–1.0)
Monocytes Relative: 8.8 % (ref 3.0–12.0)
Neutro Abs: 3.3 K/uL (ref 1.4–7.7)
Neutrophils Relative %: 63 % (ref 43.0–77.0)
Platelets: 272 K/uL (ref 150.0–400.0)
RBC: 4.71 Mil/uL (ref 4.22–5.81)
RDW: 13.2 % (ref 11.5–15.5)
WBC: 5.2 K/uL (ref 4.0–10.5)

## 2024-04-26 LAB — LIPID PANEL
Cholesterol: 153 mg/dL (ref 28–200)
HDL: 61.6 mg/dL
LDL Cholesterol: 68 mg/dL (ref 10–99)
NonHDL: 91.64
Total CHOL/HDL Ratio: 2
Triglycerides: 116 mg/dL (ref 10.0–149.0)
VLDL: 23.2 mg/dL (ref 0.0–40.0)

## 2024-04-26 LAB — TSH: TSH: 2.58 m[IU]/L (ref 0.40–4.50)

## 2024-04-26 MED ORDER — APIXABAN 5 MG PO TABS: 5.0000 mg | ORAL_TABLET | Freq: Two times a day (BID) | ORAL | 3 refills | Status: AC

## 2024-04-26 NOTE — Assessment & Plan Note (Signed)
 Here for CPX    Other issues Atrial fibrillation, new onset:  Recently his gym trainer suspected irregular heartbeat, EKG showed A-fib, new onset. CHA2DS2-VASc score: 2.   Patient's situation and PMH discussed with cardiology, proceed with Eliquis , echo, 30-day monitor and cardiology referral.  I also encouraged the patient to communicate with his ophthalmologist to be sure he does not have any strong opinion about the Eliquis  (history of wet macular degeneration on injections).     Hyperlipidemia on simvastatin , check FLP. BCC: Since dermatology regularly Wet macular degeneration: New issue.  Follow-up by ophthalmology.  Vision has improved after injections. Question of the large thyroid : Check ultrasound.                                                      RTC 4 months

## 2024-04-26 NOTE — Progress Notes (Signed)
 "  Subjective:    Patient ID: Eric Vang, male    DOB: 05/27/1945, 79 y.o.   MRN: [id number]  DOS:  04/26/2024 CPX Discussed the use of AI scribe software for clinical note transcription with the patient, who gave verbal consent to proceed.  History of Present Illness Here for CPX, multiple other issues addressed Cardiac arrhythmia? - Irregular heartbeat recently detected during gym orientation  - Abnormal EKG prior to cystectomy led to stress test at [facility], [place], which was normal - Family history of atrial fibrillation in mother and uncle - Remains physically active with regular gym workouts, walking, and home exercises - No recent chest pain, dyspnea, nausea, vomiting, diarrhea, or urinary changes   Since the last office with me, he was diagnosed with wet age-related macular degeneration in the right eye   - Receives Eylea  injections every eight weeks through retina specialist - Right eye visual acuity improved from 20/100 to 20/40 with therapy - Early dry macular degeneration in the left eye  History of bladder cancer and urologic surgery - History of bladder cancer treated with radical cystectomy and prostate removal three years ago - Follows with urology annually - Recent chest X-ray and CT of abdomen and pelvis negative for recurrence  Musculoskeletal symptoms - Mild hip osteoarthritis - Moderate lumbar degenerative disc disease - Mild morning stiffness - No significant pain      Review of Systems  Other than above, a 14 point review of systems is negative     Past Medical History:  Diagnosis Date   Anemia    Basal cell carcinoma of nose 2017   removed. a small area right eye   Bladder cancer (HCC)    BPH (benign prostatic hyperplasia)    CLUSTER HEADACHE 01/15/2007   2006, 2019   Cluster headaches    Coronary artery disease    DIVERTICULOSIS, COLON 02/04/2008   Elevated PSA    plan possible prostate Bx w/ urology   Erectile dysfunction     Hyperlipidemia N/A   started statin 2015    Inguinal hernia    right   Insomnia    Scrotal varices    Spermatocele of epididymis, single    Unilateral inguinal hernia 2017    Past Surgical History:  Procedure Laterality Date   bladder removed     CARDIAC CATHETERIZATION     > 20 years ago   COLONOSCOPY      x 2 no polyps   COLONOSCOPY W/ POLYPECTOMY  2016 ish   HERNIA REPAIR  11/2015   INGUINAL HERNIA REPAIR Right 12/03/2015   Procedure: LAPAROSCOPIC RIGHT INGUINAL HERNIA REPAIR;  Surgeon: Camellia Blush, MD;  Location: [facility] OR;  Service: General;  Laterality: Right;   INSERTION OF MESH Right 12/03/2015   Procedure: INSERTION OF MESH;  Surgeon: Camellia Blush, MD;  Location: [facility] OR;  Service: General;  Laterality: Right;   MOHS SURGERY  2017   L nose    PROSTATE SURGERY     radical cystectomy (bladder, prostate) Bilateral 05/20/2021   @ [facility]   RADIOLOGY WITH ANESTHESIA N/A 05/01/2018   Procedure: MRI WITH ANESTHESIA   BRAIN WITH AND WITHOUT;  Surgeon: Radiologist, Medication, MD;  Location: [facility] OR;  Service: Radiology;  Laterality: N/A;    Current Outpatient Medications  Medication Instructions   Aflibercept  2 mg, Every 8 weeks   Multiple Vitamins-Minerals (PRESERVISION AREDS 2) CAPS Take by mouth.   simvastatin  (ZOCOR ) 10 mg, Oral, Daily   valACYclovir  (VALTREX ) 1,000 mg,  Oral, 2 times daily PRN       Objective:   Physical Exam BP 126/66   Pulse 72   Temp 97.9 F (36.6 C) (Oral)   Resp 18   Ht 6' 1 (1.854 m)   Wt 215 lb (97.5 kg)   SpO2 97%   BMI 28.37 kg/m  General: Well developed, NAD, BMI noted Neck: Question of right-sided thyromegaly, smooth, nontender HEENT:  Normocephalic . Face symmetric, atraumatic Lungs:  CTA B Normal respiratory effort, no intercostal retractions, no accessory muscle use. Heart: Irregular  abdomen:  Not distended, soft, non-tender. No rebound or rigidity.   Lower extremities: no pretibial edema bilaterally  Skin: Exposed areas  without rash. Not pale. Not jaundice Neurologic:  alert & oriented X3.  Speech normal, gait appropriate for age and unassisted Strength symmetric and appropriate for age.  Psych: Cognition and judgment appear intact.  Cooperative with normal attention span and concentration.  Behavior appropriate. No anxious or depressed appearing.     Assessment     Assessment Hyperlipidemia , rx statins 2015 Insomnia BCC, nose 2017, sees derm q year Cold sores, prn Valtrex  H/o cluster HAs 1 -- 2006. Had  sx again 2019, MRI 04/2018; topamax  helped but had s/e (fatigue, mental fog) Urology -Elevated PSA 08-2015,  saw urology, DRE benign except for mild enlargement, PSA decreased, dx w/  Sx of bladder outlet obstruction -Bladder cancer Dx 11-2020 [facility] [place] ).   -At [facility] on 05/20/[facility]21: -CYSTECTOMY W/URETEROILEAL CONDUIT/SIGMOID BLADDER    -RADICAL PROSTATECTOMY;  w/  PELVIC LYMPHADENECTOMY, INCLUDING EXTERNAL ILIAC, HYPOGASTRIC, AND OBTURATOR  Echo stress stress 04-2021 at [facility]: Negative Exudative macular degeneration, R eye, dx 04/2023   PLAN: Here for CPX - Td 2016 -pnm 23: 2016; prevnar: 2017; PNM 20: 2024 - s/p zostavax  s/p shingrex -  s/p RSV recently  - Recommend covid vax if not done recently --CCS: cscope 2007, cscope 02-2016 , + polyps., Had a colonoscopy 02/14/2023 Polyps were precancerous.  Per GI letter: Normally follow-up is in 5 years, by then he will be 79 years old, they are not planning to contact the patient for a follow-up colonoscopy --Prostate cancer screening: sees  urology, status post radical prostatectomy. --Lifestyle: Remains very active, eats healthy. - Available labs reviewed 04/17/2024: CMP, CBC, folic acid , B12 essentially normal.  Get FLP, TSH  Other issues Atrial fibrillation, new onset:  Recently his gym trainer suspected irregular heartbeat, EKG showed A-fib, new onset. CHA2DS2-VASc score: 2.   Patient's situation and PMH discussed with cardiology,  proceed with Eliquis , echo, 30-day monitor and cardiology referral.  I also encouraged the patient to communicate with his ophthalmologist to be sure he does not have any strong opinion about the Eliquis  (history of wet macular degeneration on injections).     Hyperlipidemia on simvastatin , check FLP. BCC: Since dermatology regularly Wet macular degeneration: New issue.  Follow-up by ophthalmology.  Vision has improved after injections. Question of the large thyroid : Check ultrasound.                                                      RTC 4 months   "

## 2024-04-26 NOTE — Patient Instructions (Addendum)
 Please read your instructions carefully.   GO TO THE LAB :  Get the blood work    Go to the front desk for the checkout Please make an appointment for a check up in 4 m  Will proceed with a thyroid  ultrasound  Start Eliquis  for atrial fibrillation We are referring you to cardiology Watch for any sign of bleeding.  If that is the case stop Eliquis  on-call. If bleeding is severe go to the ER Please talk to your ophthalmologist and let him know about Eliquis  and atrial fibrillation.   Consider COVID-vaccine booster if not done recently    Apixaban  Tablets What is this medication? APIXABAN  (a PIX a ban) prevents and treats blood clots. It is also used to lower the risk of stroke in people with AFib (atrial fibrillation). It belongs to a group of medications called blood thinners. This medicine may be used for other purposes; ask your health care provider or pharmacist if you have questions. COMMON BRAND NAME(S): Eliquis  What should I tell my care team before I take this medication? They need to know if you have any of these conditions: Antiphospholipid syndrome (APS) Bleeding problems Having surgery, an epidural, a spinal tap, or any other procedure that involves the area around your spine Kidney disease Liver disease Prosthetic heart valve An unusual or allergic reaction to apixaban , other medications, foods, dyes, or preservatives Pregnant or trying to get pregnant Breastfeeding How should I use this medication? Take this medication by mouth. Take it as directed on the prescription label at the same time every day. You can take it with or without food. If it upsets your stomach, take it with food. For your therapy to work as well as possible, take each dose exactly as prescribed on the prescription label. Do not skip doses. Skipping doses or stopping this medication can increase your risk of a blood clot or stroke. Keep taking this medication unless your care team tells you to  stop. A special MedGuide will be given to you by the pharmacist with each prescription and refill. Be sure to read this information carefully each time. Talk to your care team about the use of this medication in children. While it may be prescribed for children as young as newborns for selected conditions, precautions do apply. Overdosage: If you think you have taken too much of this medicine contact a poison control center or emergency room at once. NOTE: This medicine is only for you. Do not share this medicine with others. What if I miss a dose? If you miss a dose, take it as soon as you can. If it is almost time for your next dose, take only that dose. Do not take double or extra doses. If a child vomits or spits up 30 minutes or less after taking their dose, repeat that dose. If the child vomits more than 30 minutes after taking their dose, do not give another dose. Give their next dose at their normal scheduled time. Talk to your care team if the child continues to vomit or spit up after taking their dose. What may interact with this medication? Do not take this medication with any of the following: Defibrotide Factor X Mifepristone Prothrombin complex concentrate This medication may also interact with the following: Aspirin and aspirin-like medications NSAIDs, medications for pain and inflammation, such as ibuprofen or naproxen Other medications that treat and prevent blood clots, such as clopidogrel, enoxaparin, dalteparin, heparin , warfarin Rifampin Ritonavir SNRIs, medications for depression, such as desvenlafaxine,  duloxetine, levomilnacipran, venlafaxine Some medications for fungal infections, such as itraconazole or ketoconazole  Some medications for seizures, such as carbamazepine or phenytoin SSRIs, medications for depression, such as citalopram, escitalopram, fluoxetine, fluvoxamine, paroxetine, sertraline St. John's wort Other medications may affect the way this medication  works. Talk with your care team about all the medications you take. They may suggest changes to your treatment plan to lower the risk of side effects and to make sure your medications work as intended. This list may not describe all possible interactions. Give your health care provider a list of all the medicines, herbs, non-prescription drugs, or dietary supplements you use. Also tell them if you smoke, drink alcohol, or use illegal drugs. Some items may interact with your medicine. What should I watch for while using this medication? Visit your care team for regular checks on your progress. It is important to keep your scheduled visit. In children, this medication is dosed based on their weight. The dose will need to be adjusted by your care team as they grow and their weight changes. This ensures the child receives the correct dose. You may need blood work done while you are taking this medication. Avoid sports and activities that may cause injury while you are taking this medication. Severe falls or injuries can cause unseen bleeding. Be careful when using sharp tools or knives. Consider using an neurosurgeon. Take special care brushing or flossing your teeth. Report any injuries, bruising, or red spots on the skin to your care team. Before having surgery, dental work, or another procedure, tell your care team that you are taking this medication. People who take this medication and have a spinal procedure are at risk of forming a blood clot in the space around the brain or spinal cord. This could cause paralysis (not being able to move). The risk is higher in people who have spinal problems or injuries, have had spinal surgery in the past, and for those with a tube (catheter) in their back. Taking other medications that also affect bleeding, such as NSAIDs or other blood thinners, can also increase the risk. Your care team will watch you closely. Let them know right away if you feel pain, tingling, or  numbness in your legs or feet. Wear a medical ID bracelet or chain. Carry a card that describes your condition. List the medications and doses you take on the card. Talk to your care team if you may be pregnant. Serious fetal side effects, such as bleeding, may occur if you take this medication during pregnancy. There are benefits and risks to taking medications during pregnancy. Your care team can help you find the option that works for you. Talk to your care team before breastfeeding. Changes to your treatment plan may be needed. What side effects may I notice from receiving this medication? Side effects that you should report to your care team as soon as possible: Allergic reactions--skin rash, itching, hives, swelling of the face, lips, tongue, or throat Bleeding--bloody or black, tar-like stools, vomiting blood or brown material that looks like coffee grounds, red or dark brown urine, small red or purple spots on skin, unusual bruising or bleeding Bleeding in the brain--severe headache, stiff neck, confusion, dizziness, change in vision, numbness or weakness of the face, arm, or leg, trouble speaking, trouble walking, vomiting Heavy periods Side effects that usually do not require medical attention (report these to your care team if they continue or are bothersome): Headache Vomiting This list may not describe all  possible side effects. Call your doctor for medical advice about side effects. You may report side effects to FDA at 1-800-FDA-1088. Where should I keep my medication? Keep out of the reach of children and pets. Store at room temperature between 20 and 25 degrees C (68 and 77 degrees F). Get rid of any unused medication after the expiration date. To get rid of medications that are no longer needed or expired: Take the medication to a take-back program. Check with your pharmacy or law enforcement to find a location. If you cannot return the medication, check the label or package  insert to see if the medication should be thrown out in the garbage or flushed down the toilet. If you are not sure, ask your care team. If it is safe to put it in the trash, empty the medication out of the container. Mix it with cat litter, dirt, coffee grounds, or another unwanted substance. Seal the mixture in a bag or container. Put it in the trash. NOTE: This sheet is a summary. It may not cover all possible information. If you have questions about this medicine, talk to your doctor, pharmacist, or health care provider.  2025 Elsevier/Gold Standard (2023-07-27 00:00:00)

## 2024-04-26 NOTE — Assessment & Plan Note (Signed)
 Here for CPX - Td 2016 -pnm 23: 2016; prevnar: 2017; PNM 20: 2024 - s/p zostavax  s/p shingrex -  s/p RSV recently  - Recommend covid vax if not done recently --CCS: cscope 2007, cscope 02-2016 , + polyps., Had a colonoscopy 02/14/2023 Polyps were precancerous.  Per GI letter: Normally follow-up is in 5 years, by then he will be 79 years old, they are not planning to contact the patient for a follow-up colonoscopy --Prostate cancer screening: sees  urology, status post radical prostatectomy. --Lifestyle: Remains very active, eats healthy. - Available labs reviewed 04/17/2024: CMP, CBC, folic acid , B12 essentially normal.  Get FLP, TSH

## 2024-04-29 ENCOUNTER — Ambulatory Visit: Payer: Self-pay | Admitting: Internal Medicine

## 2024-04-30 ENCOUNTER — Encounter: Payer: Self-pay | Admitting: Internal Medicine

## 2024-04-30 NOTE — Progress Notes (Unsigned)
 "    Referring-Jos Paz, MD Reason for referral-atrial fibrillation  HPI: 79 year old male for evaluation of atrial fibrillation at request of Elnoria Mech, MD.  Abdominal MRI February 2023 at [facility] showed no aneurysm.  Stress echocardiogram at [facility] in 2023 showed normal LV function with no significant wall motion abnormalities with stress.  Laboratories January 2026 showed potassium 4.2, creatinine 0.96, TSH 2.58, hemoglobin 15.8, platelet count 272.  Current Outpatient Medications  Medication Sig Dispense Refill   Aflibercept  2 MG/0.05ML SOLN 2 mg by Intravitreal route every 8 (eight) weeks.     apixaban  (ELIQUIS ) 5 MG TABS tablet Take 1 tablet (5 mg total) by mouth 2 (two) times daily. 60 tablet 3   Multiple Vitamins-Minerals (PRESERVISION AREDS 2) CAPS Take by mouth.     simvastatin  (ZOCOR ) 10 MG tablet Take 1 tablet (10 mg total) by mouth daily. 90 tablet 1   valACYclovir  (VALTREX ) 1000 MG tablet Take 1 tablet (1,000 mg total) by mouth 2 (two) times daily as needed (for cold sores per 5 days). 60 tablet 3   No current facility-administered medications for this visit.    Allergies[1]   Past Medical History:  Diagnosis Date   Anemia    Basal cell carcinoma of nose 2017   removed. a small area right eye   Bladder cancer (HCC)    BPH (benign prostatic hyperplasia)    CLUSTER HEADACHE 01/15/2007   2006, 2019   Cluster headaches    Coronary artery disease    DIVERTICULOSIS, COLON 02/04/2008   Elevated PSA    plan possible prostate Bx w/ urology   Erectile dysfunction    Hyperlipidemia N/A   started statin 2015    Inguinal hernia    right   Insomnia    Scrotal varices    Spermatocele of epididymis, single    Unilateral inguinal hernia 2017    Past Surgical History:  Procedure Laterality Date   bladder removed     CARDIAC CATHETERIZATION     > 20 years ago   COLONOSCOPY      x 2 no polyps   COLONOSCOPY W/ POLYPECTOMY  2016 ish   HERNIA REPAIR  11/2015   INGUINAL HERNIA  REPAIR Right 12/03/2015   Procedure: LAPAROSCOPIC RIGHT INGUINAL HERNIA REPAIR;  Surgeon: Camellia Blush, MD;  Location: [facility] OR;  Service: General;  Laterality: Right;   INSERTION OF MESH Right 12/03/2015   Procedure: INSERTION OF MESH;  Surgeon: Camellia Blush, MD;  Location: [facility] OR;  Service: General;  Laterality: Right;   MOHS SURGERY  2017   L nose    PROSTATE SURGERY     radical cystectomy (bladder, prostate) Bilateral 05/20/2021   @ [facility]   RADIOLOGY WITH ANESTHESIA N/A 05/01/2018   Procedure: MRI WITH ANESTHESIA   BRAIN WITH AND WITHOUT;  Surgeon: Radiologist, Medication, MD;  Location: [facility] OR;  Service: Radiology;  Laterality: N/A;    Social History   Socioeconomic History   Marital status: Married    Spouse name: Not on file   Number of children: 1   Years of education: Not on file   Highest education level: Bachelor's degree (e.g., BA, AB, BS)  Occupational History   Occupation: fully retired Market Researcher  Tobacco Use   Smoking status: Former    Types: Cigarettes   Smokeless tobacco: Never   Tobacco comments:    stopped in 1979, was a light smoker  Vaping Use   Vaping status: Never Used  Substance and Sexual Activity  Alcohol use: Not Currently    Alcohol/week: 1.0 standard drink of alcohol    Comment: occassionally   Drug use: No   Sexual activity: Not Currently  Other Topics Concern   Not on file  Social History Narrative   Moved from [place]  2005 .   Lives home with wife, terri. 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  Education [employer].  Caffeine 4 cups daily.     Step daughter is a CHARITY FUNDRAISER   Sister in law [employer]    Exercise- walks qd    Social Drivers of Health   Tobacco Use: Medium Risk (04/26/2024)   Patient History    Smoking Tobacco Use: Former    Smokeless Tobacco Use: Never    Passive Exposure: Not on file  Financial Resource Strain: Low Risk (07/16/2023)   Overall Financial Resource Strain (CARDIA)    Difficulty of Paying  Living Expenses: Not hard at all  Food Insecurity: No Food Insecurity (07/16/2023)   Hunger Vital Sign    Worried About Running Out of Food in the Last Year: Never true    Ran Out of Food in the Last Year: Never true  Transportation Needs: No Transportation Needs (07/16/2023)   PRAPARE - Administrator, Civil Service (Medical): No    Lack of Transportation (Non-Medical): No  Physical Activity: Sufficiently Active (07/16/2023)   Exercise Vital Sign    Days of Exercise per Week: 6 days    Minutes of Exercise per Session: 60 min  Stress: No Stress Concern Present (07/16/2023)   [employer] of Occupational Health - Occupational Stress Questionnaire    Feeling of Stress : Not at all  Social Connections: Socially Integrated (07/16/2023)   Social Connection and Isolation Panel    Frequency of Communication with Friends and Family: Twice a week    Frequency of Social Gatherings with Friends and Family: Once a week    Attends Religious Services: More than 4 times per year    Active Member of [employer] or Organizations: Yes    Attends Banker Meetings: More than 4 times per year    Marital Status: Married  Catering Manager Violence: Not At Risk (07/17/2023)   Humiliation, Afraid, Rape, and Kick questionnaire    Fear of Current or Ex-Partner: No    Emotionally Abused: No    Physically Abused: No    Sexually Abused: No  Depression (PHQ2-9): Low Risk (04/26/2024)   Depression (PHQ2-9)    PHQ-2 Score: 0  Alcohol Screen: Low Risk (07/17/2023)   Alcohol Screen    Last Alcohol Screening Score (AUDIT): 0  Housing: Unknown (04/17/2024)   Received from [facility]   Epic    Unable to Pay for Housing in the Last Year: Not on file    Number of Times Moved in the Last Year: Not on file    At any time in the past 12 months, were you homeless or living in a shelter (including now)?: No  Utilities: Low Risk (04/09/2023)   Received from [facility]   Utilities     In the past 12 months has the electric, gas, oil, or water company threatened to shut off services in your home? : No  Health Literacy: Adequate Health Literacy (07/17/2023)   B1300 Health Literacy    Frequency of need for help with medical instructions: Never    Family History  Problem Relation Age of Onset   Diabetes Mother 70  natural causes.    Atrial fibrillation Mother    Uterine cancer Mother    Cancer Father 46       bone    Cancer Paternal Grandmother        sinus cancer   Colon cancer Neg Hx    Prostate cancer Neg Hx    CAD Neg Hx    Cancer - Colon Neg Hx     ROS: no fevers or chills, productive cough, hemoptysis, dysphasia, odynophagia, melena, hematochezia, dysuria, hematuria, rash, seizure activity, orthopnea, PND, pedal edema, claudication. Remaining systems are negative.  Physical Exam:   There were no vitals taken for this visit.  General:  Well developed/well nourished in NAD Skin warm/dry Patient not depressed No peripheral clubbing Back-normal HEENT-normal/normal eyelids Neck supple/normal carotid upstroke bilaterally; no bruits; no JVD; no thyromegaly chest - CTA/ normal expansion CV - RRR/normal S1 and S2; no murmurs, rubs or gallops;  PMI nondisplaced Abdomen -NT/ND, no HSM, no mass, + bowel sounds, no bruit 2+ femoral pulses, no bruits Ext-no edema, chords, 2+ DP Neuro-grossly nonfocal  ECG -April 26, 2024 atrial fibrillation, incomplete right bundle branch block.  Personally reviewed  A/P  1 new diagnosis atrial fibrillation-  2 hyperlipidemia-   Redell Shallow, MD     [1] No Known Allergies  "

## 2024-05-01 ENCOUNTER — Encounter: Payer: Self-pay | Admitting: Cardiology

## 2024-05-01 ENCOUNTER — Ambulatory Visit: Payer: Medicare (Managed Care) | Attending: Cardiology | Admitting: Cardiology

## 2024-05-01 VITALS — BP 100/80 | HR 88 | Ht 73.0 in | Wt 216.0 lb

## 2024-05-01 DIAGNOSIS — I4819 Other persistent atrial fibrillation: Secondary | ICD-10-CM

## 2024-05-01 DIAGNOSIS — E785 Hyperlipidemia, unspecified: Secondary | ICD-10-CM

## 2024-05-01 NOTE — Patient Instructions (Signed)
" ° °  Testing/Procedures:  Your physician has requested that you have an echocardiogram. Echocardiography is a painless test that uses sound waves to create images of your heart. It provides your doctor with information about the size and shape of your heart and how well your hearts chambers and valves are working. This procedure takes approximately one hour. There are no restrictions for this procedure. Please do NOT wear cologne, perfume, aftershave, or lotions (deodorant is allowed). Please arrive 15 minutes prior to your appointment time.  Please note: We ask at that you not bring children with you during ultrasound (echo/ vascular) testing. Due to room size and safety concerns, children are not allowed in the ultrasound rooms during exams. Our front office staff cannot provide observation of children in our lobby area while testing is being conducted. An adult accompanying a patient to their appointment will only be allowed in the ultrasound room at the discretion of the ultrasound technician under special circumstances. We apologize for any inconvenience. [address]  Follow-Up: At [facility], you and your health needs are our priority.  As part of our continuing mission to provide you with exceptional heart care, our providers are all part of one team.  This team includes your primary Cardiologist (physician) and Advanced Practice Providers or APPs (Physician Assistants and Nurse Practitioners) who all work together to provide you with the care you need, when you need it.  Your next appointment:   4 week(s)  Provider:   Jon Hails, PA-C, Dayna Dunn, PA-C, Callie Goodrich, PA-C, Kathleen Johnson, PA-C, Michele Lenze, PA-C, Hao Meng, PA-C, Damien Braver, NP, Glendia Ferrier, PA-C, or Katlyn West, NP      Then, REDELL SHALLOW MD will plan to see you again in 4 month(s).              "

## 2024-05-03 ENCOUNTER — Ambulatory Visit (HOSPITAL_BASED_OUTPATIENT_CLINIC_OR_DEPARTMENT_OTHER)
Admission: RE | Admit: 2024-05-03 | Discharge: 2024-05-03 | Disposition: A | Payer: Medicare (Managed Care) | Source: Ambulatory Visit | Attending: Internal Medicine | Admitting: Internal Medicine

## 2024-05-03 DIAGNOSIS — E010 Iodine-deficiency related diffuse (endemic) goiter: Secondary | ICD-10-CM | POA: Insufficient documentation

## 2024-06-12 ENCOUNTER — Ambulatory Visit (HOSPITAL_COMMUNITY): Payer: Medicare (Managed Care)

## 2024-06-17 ENCOUNTER — Ambulatory Visit: Admitting: Cardiology

## 2024-07-17 ENCOUNTER — Ambulatory Visit: Payer: Medicare (Managed Care)

## 2024-08-23 ENCOUNTER — Ambulatory Visit: Payer: Medicare (Managed Care) | Admitting: Internal Medicine

## 2024-08-29 ENCOUNTER — Ambulatory Visit: Admitting: Cardiology

## 2025-04-28 ENCOUNTER — Encounter: Payer: Medicare (Managed Care) | Admitting: Internal Medicine
